# Patient Record
Sex: Male | Born: 1950 | Hispanic: No | Smoking: Never smoker
Health system: Southern US, Community
[De-identification: ages and names within clinical notes are randomized; demographics above are authoritative.]

## PROBLEM LIST (undated history)

## (undated) DIAGNOSIS — E785 Hyperlipidemia, unspecified: Secondary | ICD-10-CM

## (undated) DIAGNOSIS — H33012 Retinal detachment with single break, left eye: Secondary | ICD-10-CM

## (undated) DIAGNOSIS — H269 Unspecified cataract: Secondary | ICD-10-CM

## (undated) DIAGNOSIS — Z8719 Personal history of other diseases of the digestive system: Secondary | ICD-10-CM

## (undated) DIAGNOSIS — M169 Osteoarthritis of hip, unspecified: Secondary | ICD-10-CM

## (undated) DIAGNOSIS — K859 Acute pancreatitis without necrosis or infection, unspecified: Secondary | ICD-10-CM

## (undated) DIAGNOSIS — Z9889 Other specified postprocedural states: Secondary | ICD-10-CM

## (undated) DIAGNOSIS — K5732 Diverticulitis of large intestine without perforation or abscess without bleeding: Secondary | ICD-10-CM

## (undated) DIAGNOSIS — I10 Essential (primary) hypertension: Secondary | ICD-10-CM

## (undated) HISTORY — DX: Acute pancreatitis without necrosis or infection, unspecified: K85.90

## (undated) HISTORY — DX: Osteoarthritis of hip, unspecified: M16.9

## (undated) HISTORY — DX: Essential (primary) hypertension: I10

## (undated) HISTORY — DX: Diverticulitis of large intestine without perforation or abscess without bleeding: K57.32

## (undated) HISTORY — DX: Other specified postprocedural states: Z98.890

## (undated) HISTORY — DX: Personal history of other diseases of the digestive system: Z87.19

## (undated) HISTORY — PX: EYE SURGERY: SHX253

## (undated) HISTORY — DX: Unspecified cataract: H26.9

## (undated) HISTORY — DX: Hyperlipidemia, unspecified: E78.5

## (undated) HISTORY — DX: Retinal detachment with single break, left eye: H33.012

---

## 1999-10-25 ENCOUNTER — Ambulatory Visit (HOSPITAL_COMMUNITY): Admission: RE | Admit: 1999-10-25 | Discharge: 1999-10-25 | Payer: Self-pay | Admitting: Gastroenterology

## 2002-07-29 ENCOUNTER — Encounter: Payer: Self-pay | Admitting: Surgery

## 2002-07-29 ENCOUNTER — Encounter: Admission: RE | Admit: 2002-07-29 | Discharge: 2002-07-29 | Payer: Self-pay | Admitting: Surgery

## 2003-03-18 ENCOUNTER — Encounter: Admission: RE | Admit: 2003-03-18 | Discharge: 2003-03-18 | Payer: Self-pay | Admitting: Surgery

## 2003-03-18 ENCOUNTER — Encounter: Payer: Self-pay | Admitting: Surgery

## 2003-04-20 ENCOUNTER — Encounter: Payer: Self-pay | Admitting: Surgery

## 2003-04-20 ENCOUNTER — Encounter: Admission: RE | Admit: 2003-04-20 | Discharge: 2003-04-20 | Payer: Self-pay | Admitting: Surgery

## 2003-08-26 ENCOUNTER — Encounter: Admission: RE | Admit: 2003-08-26 | Discharge: 2003-08-26 | Payer: Self-pay | Admitting: Obstetrics and Gynecology

## 2004-05-11 ENCOUNTER — Encounter: Admission: RE | Admit: 2004-05-11 | Discharge: 2004-05-11 | Payer: Self-pay | Admitting: Surgery

## 2004-09-04 ENCOUNTER — Encounter: Admission: RE | Admit: 2004-09-04 | Discharge: 2004-09-04 | Payer: Self-pay | Admitting: *Deleted

## 2004-12-13 ENCOUNTER — Ambulatory Visit (HOSPITAL_COMMUNITY): Admission: RE | Admit: 2004-12-13 | Discharge: 2004-12-13 | Payer: Self-pay | Admitting: Gastroenterology

## 2006-05-20 ENCOUNTER — Encounter: Admission: RE | Admit: 2006-05-20 | Discharge: 2006-05-20 | Payer: Self-pay | Admitting: *Deleted

## 2006-09-30 DIAGNOSIS — H269 Unspecified cataract: Secondary | ICD-10-CM

## 2006-09-30 HISTORY — DX: Unspecified cataract: H26.9

## 2007-10-01 DIAGNOSIS — I10 Essential (primary) hypertension: Secondary | ICD-10-CM

## 2007-10-01 HISTORY — DX: Essential (primary) hypertension: I10

## 2008-09-30 DIAGNOSIS — H33012 Retinal detachment with single break, left eye: Secondary | ICD-10-CM

## 2008-09-30 HISTORY — DX: Retinal detachment with single break, left eye: H33.012

## 2009-06-17 ENCOUNTER — Ambulatory Visit (HOSPITAL_COMMUNITY): Admission: RE | Admit: 2009-06-17 | Discharge: 2009-06-17 | Payer: Self-pay | Admitting: Ophthalmology

## 2009-06-29 ENCOUNTER — Encounter: Admission: RE | Admit: 2009-06-29 | Discharge: 2009-06-29 | Payer: Self-pay | Admitting: Surgery

## 2009-08-23 ENCOUNTER — Encounter: Admission: RE | Admit: 2009-08-23 | Discharge: 2009-08-23 | Payer: Self-pay | Admitting: Obstetrics and Gynecology

## 2010-10-21 ENCOUNTER — Encounter: Payer: Self-pay | Admitting: Surgery

## 2011-02-15 NOTE — Op Note (Signed)
NAMELYNCOLN, LEDGERWOOD              ACCOUNT NO.:  [id number]   MEDICAL RECORD NO.:  [id number]          PATIENT TYPE:  AMB   LOCATION:  ENDO                         FACILITY:  [facility]   PHYSICIAN:  James L. Malon Kindle., M.D.DATE OF BIRTH:  29-Apr-1951   DATE OF PROCEDURE:  12/13/2004  DATE OF DISCHARGE:                                 OPERATIVE REPORT   PROCEDURES:  Colonoscopy.   MEDICATIONS:  The patient received a total of 25 mcg of fentanyl and 7 mg of  Versed for both procedures.   SCOPE:  Olympus pediatric adjustable colonoscope.   INDICATIONS:  A 60 year old with a strong family history of colon.  Both  parents have had colon cancer.  This is done as a routine evaluation.   DESCRIPTION OF PROCEDURE:  The procedure had been explained to the patient  and consent obtained.  With the patient in the left lateral decubitus  position, the Olympus scope was inserted and advanced.  The prep was  excellent.  We were able to advance easily to the cecum with no difficulty.  The ileocecal valve and appendiceal orifice were seen.  The scope was  withdrawn in the cecum.  The ascending colon, transverse colon, splenic  flexure, descending and sigmoid colon were seen well and were free of  polyps.  There were scattered diverticula throughout the entire colon most  prominent in the sigmoid with multiple small-mouth diverticula.  The lumen  was not particularly narrowed.  No polyps were seen throughout the entire  colon.  The rectum was seen well and was free of polyps.  There were small  internal hemorrhoids.  The scope was withdrawn.  The patient tolerated the  procedure well.   ASSESSMENT:  1.  Family history of colon cancer with negative colonoscopy at this time.      B16.0  2.  Diverticulosis.   PLAN:  Recommend yearly Hemoccults and diverticulosis instructions.  Will  recommend a repeat colonoscopy in five years.      JLE/MEDQ  D:  12/13/2004  T:  12/13/2004  Job:  [id number]   cc:    Vikki Ports, MD  [address]  [place]  [place] [address]   [place]. [name], MD  [address], [address]  [place]  [place] [address]

## 2011-02-15 NOTE — Op Note (Signed)
Harold Davis, Harold Davis              ACCOUNT NO.:  [id number]   MEDICAL RECORD NO.:  [id number]          PATIENT TYPE:  AMB   LOCATION:  ENDO                         FACILITY:  [facility]   PHYSICIAN:  James L. Malon Kindle., M.D.DATE OF BIRTH:  [birth year]   DATE OF PROCEDURE:  12/13/2004  DATE OF DISCHARGE:                                 OPERATIVE REPORT   PROCEDURE:  Esophagogastroduodenoscopy.   MEDICATIONS:  Cetacaine spray, fentanyl 25 mcg, Versed 5 mg IV.   INDICATIONS:  The patient has had some intermittent esophageal reflux  symptoms.  This is done to look for Barrett's esophagus.   DESCRIPTION OF PROCEDURE:  The procedure had been explained to the patient  and consent obtained.  In the left lateral decubitus position, the Olympus  scope was inserted.  With swallow with agglutination, easily passed into the  esophagus.  The stomach was identified, pylorus identified and passed.  The  duodenum, including the bulb and second portion, was seen well and was  unremarkable.  There was no ulceration or inflammation.  The pyloric channel  was normal, as was the antrum.  There was no gastritis or ulceration.  The  body of the stomach was seen well and was normal.  The scope was  retroflexed, and the fundus and cardia of the stomach were grossly normal.  There was a very small hiatal hernia.  The GE junction was widely patent.  The distal esophagus was reddened.  There were several small erosions just  above the GE junction.  There was no sign of Barrett's esophagus.  The scope  was withdrawn and the proximal esophagus was normal.  The patient tolerated  the procedure well and was maintained on low-flow oxygen and pulse oximetry  throughout the procedure.   ASSESSMENT:  Erosive esophagitis, 530.11, with no signs of Barrett's  esophagus.   PLAN:  Will recommend treating with a PPI of choice.  Will proceed with  colonoscopy at this time as planned.      JLE/MEDQ  D:  12/13/2004  T:   12/13/2004  Job:  [id number]   cc:   [place] [name], MD  [address], [address]  [place]  [place] [address]   Vikki Ports, MD  [phone], [address]  [place]  [place] [address]

## 2011-02-15 NOTE — Procedures (Signed)
[place]  Patient:    Harold Davis                      MRN: [id number] Proc. Date: 10/25/99 Adm. Date:  [id number] Attending:  Orland Mustard CC:         [place]. [name], M.D.                           Procedure Report  PROCEDURE PERFORMED:  Colonoscopy.  ENDOSCOPIST:  Llana Aliment. Randa Evens, M.D.  MEDICATIONS USED:  Fentanyl 50 mcg, Versed 6 mg IV.  INSTRUMENT:  Olympus adult video colonoscope.  INDICATIONS:  Strong family history of colon cancer.  Both parents have had colon cancer.  Patient has had intermittent rectal bleeding that has been presumed to be due for internal hemorrhoids.  DESCRIPTION OF PROCEDURE:  The procedure had been explained to the patient and consent obtained.  With the patient in the left lateral decubitus position, the  adult video colonoscope was inserted and advanced under direct visualization. he prep was excellent and we were able to advance to the cecum.  The appendiceal orifice was seen.  It appeared normal.  The terminal ileum was entered for a short distance and was normal.  The scope was withdrawn.  The cecum, ascending colon,  hepatic flexure, transverse colon, splenic flexure, descending and sigmoid colon were seen well.  Scattered diverticulosis throughout the entire colon.  No polyps were seen.  Scope withdrawn and the rectum was carefully examined.  No abnormalities seen in the rectum.  The patient had moderate-sized internal hemorrhoids.  Scope withdrawn and patient tolerated the procedure well.  ASSESSMENT: 1. Family history of colon cancer with both parents having had colon cancer. 2. Scattered diverticulosis. 3. Internal hemorrhoids.  PLAN:  Will recommend repeating in five years due to his family history. DD:  10/25/99 TD:  10/26/99 Job: [id number]

## 2012-02-21 ENCOUNTER — Other Ambulatory Visit (INDEPENDENT_AMBULATORY_CARE_PROVIDER_SITE_OTHER): Payer: Self-pay | Admitting: Surgery

## 2012-02-23 ENCOUNTER — Other Ambulatory Visit (INDEPENDENT_AMBULATORY_CARE_PROVIDER_SITE_OTHER): Payer: Self-pay | Admitting: Surgery

## 2013-10-09 ENCOUNTER — Other Ambulatory Visit (INDEPENDENT_AMBULATORY_CARE_PROVIDER_SITE_OTHER): Payer: Self-pay | Admitting: General Surgery

## 2014-03-14 ENCOUNTER — Other Ambulatory Visit (INDEPENDENT_AMBULATORY_CARE_PROVIDER_SITE_OTHER): Payer: Self-pay

## 2014-03-14 DIAGNOSIS — O169 Unspecified maternal hypertension, unspecified trimester: Secondary | ICD-10-CM

## 2014-03-14 MED ORDER — HYDROCHLOROTHIAZIDE 25 MG PO TABS
ORAL_TABLET | ORAL | Status: DC
Start: 2014-03-14 — End: 2018-01-15

## 2014-08-31 ENCOUNTER — Other Ambulatory Visit (INDEPENDENT_AMBULATORY_CARE_PROVIDER_SITE_OTHER): Payer: Self-pay

## 2014-08-31 MED ORDER — HYDROCHLOROTHIAZIDE 25 MG PO TABS: 25.0000 mg | ORAL_TABLET | Freq: Every day | ORAL | Status: DC

## 2015-02-22 ENCOUNTER — Encounter: Payer: Self-pay | Admitting: Surgery

## 2015-02-22 ENCOUNTER — Ambulatory Visit
Admission: RE | Admit: 2015-02-22 | Discharge: 2015-02-22 | Disposition: A | Discharge status: Home / Self Care | Payer: 59 | Source: Ambulatory Visit | Attending: Surgery | Admitting: Surgery

## 2015-02-22 ENCOUNTER — Other Ambulatory Visit (INDEPENDENT_AMBULATORY_CARE_PROVIDER_SITE_OTHER): Payer: Self-pay

## 2015-02-22 DIAGNOSIS — Z9889 Other specified postprocedural states: Secondary | ICD-10-CM

## 2015-02-22 DIAGNOSIS — R05 Cough: Secondary | ICD-10-CM

## 2015-02-22 DIAGNOSIS — R059 Cough, unspecified: Secondary | ICD-10-CM

## 2015-02-22 HISTORY — DX: Other specified postprocedural states: Z98.890

## 2015-11-02 ENCOUNTER — Other Ambulatory Visit: Payer: Self-pay | Admitting: Surgery

## 2015-11-02 NOTE — Telephone Encounter (Signed)
I follow Harold Davis for his BP meds. He knows that he needs a PCP.  He is doing well with no symptoms or chest pain.  Obtained B/P today. 138/76.  Did not obtain pulse.   Rx for HCTZ 25mg , #90, refill 1, called in to [facility] pharmacy by Aleatha Borer.

## 2016-03-28 DIAGNOSIS — Z0100 Encounter for examination of eyes and vision without abnormal findings: Secondary | ICD-10-CM | POA: Diagnosis not present

## 2016-11-14 DIAGNOSIS — H26492 Other secondary cataract, left eye: Secondary | ICD-10-CM | POA: Diagnosis not present

## 2017-06-23 ENCOUNTER — Ambulatory Visit
Admission: RE | Admit: 2017-06-23 | Discharge: 2017-06-23 | Disposition: A | Discharge status: Home / Self Care | Payer: 59 | Source: Ambulatory Visit | Attending: General Surgery | Admitting: General Surgery

## 2017-06-23 ENCOUNTER — Other Ambulatory Visit: Payer: Self-pay | Admitting: General Surgery

## 2017-06-23 DIAGNOSIS — M545 Low back pain: Secondary | ICD-10-CM

## 2017-06-23 DIAGNOSIS — M25551 Pain in right hip: Secondary | ICD-10-CM

## 2017-06-23 DIAGNOSIS — M25552 Pain in left hip: Principal | ICD-10-CM

## 2017-06-23 DIAGNOSIS — M549 Dorsalgia, unspecified: Secondary | ICD-10-CM | POA: Diagnosis not present

## 2017-06-23 DIAGNOSIS — M160 Bilateral primary osteoarthritis of hip: Secondary | ICD-10-CM | POA: Diagnosis not present

## 2017-06-23 DIAGNOSIS — M5136 Other intervertebral disc degeneration, lumbar region: Secondary | ICD-10-CM | POA: Diagnosis not present

## 2017-09-29 ENCOUNTER — Other Ambulatory Visit (HOSPITAL_COMMUNITY): Payer: Self-pay | Admitting: General Surgery

## 2017-09-29 DIAGNOSIS — K5792 Diverticulitis of intestine, part unspecified, without perforation or abscess without bleeding: Secondary | ICD-10-CM

## 2017-09-30 DIAGNOSIS — K859 Acute pancreatitis without necrosis or infection, unspecified: Secondary | ICD-10-CM

## 2017-09-30 HISTORY — DX: Acute pancreatitis without necrosis or infection, unspecified: K85.90

## 2017-10-01 ENCOUNTER — Other Ambulatory Visit: Payer: Self-pay | Admitting: Gastroenterology

## 2017-10-01 ENCOUNTER — Encounter (HOSPITAL_COMMUNITY): Payer: Self-pay

## 2017-10-01 ENCOUNTER — Ambulatory Visit (HOSPITAL_COMMUNITY)
Admission: RE | Admit: 2017-10-01 | Discharge: 2017-10-01 | Disposition: A | Discharge status: Home / Self Care | Payer: 59 | Source: Ambulatory Visit | Attending: General Surgery | Admitting: General Surgery

## 2017-10-01 DIAGNOSIS — K4020 Bilateral inguinal hernia, without obstruction or gangrene, not specified as recurrent: Secondary | ICD-10-CM | POA: Insufficient documentation

## 2017-10-01 DIAGNOSIS — K5730 Diverticulosis of large intestine without perforation or abscess without bleeding: Secondary | ICD-10-CM | POA: Insufficient documentation

## 2017-10-01 DIAGNOSIS — K5792 Diverticulitis of intestine, part unspecified, without perforation or abscess without bleeding: Secondary | ICD-10-CM | POA: Diagnosis not present

## 2017-10-01 DIAGNOSIS — K859 Acute pancreatitis without necrosis or infection, unspecified: Secondary | ICD-10-CM | POA: Diagnosis not present

## 2017-10-01 DIAGNOSIS — I700 Atherosclerosis of aorta: Secondary | ICD-10-CM | POA: Diagnosis not present

## 2017-10-01 DIAGNOSIS — R1011 Right upper quadrant pain: Secondary | ICD-10-CM

## 2017-10-01 LAB — POCT I-STAT CREATININE: Creatinine, Ser: 1 mg/dL (ref 0.61–1.24)

## 2017-10-01 MED ORDER — IOPAMIDOL (ISOVUE-300) INJECTION 61%
INTRAVENOUS | Status: AC
  Filled 2017-10-01: qty 100

## 2017-10-01 MED ORDER — IOPAMIDOL (ISOVUE-300) INJECTION 61%
100.0000 mL | Freq: Once | INTRAVENOUS | Status: AC | PRN
  Administered 2017-10-01: 100 mL via INTRAVENOUS

## 2017-10-02 ENCOUNTER — Ambulatory Visit
Admission: RE | Admit: 2017-10-02 | Discharge: 2017-10-02 | Disposition: A | Discharge status: Home / Self Care | Payer: 59 | Source: Ambulatory Visit | Attending: Gastroenterology | Admitting: Gastroenterology

## 2017-10-02 ENCOUNTER — Other Ambulatory Visit (HOSPITAL_COMMUNITY): Payer: Self-pay | Admitting: Gastroenterology

## 2017-10-02 ENCOUNTER — Ambulatory Visit (HOSPITAL_COMMUNITY)
Admission: RE | Admit: 2017-10-02 | Discharge: 2017-10-02 | Disposition: A | Discharge status: Home / Self Care | Payer: 59 | Source: Ambulatory Visit | Attending: Gastroenterology | Admitting: Gastroenterology

## 2017-10-02 DIAGNOSIS — R1011 Right upper quadrant pain: Secondary | ICD-10-CM | POA: Diagnosis not present

## 2017-10-02 DIAGNOSIS — K8050 Calculus of bile duct without cholangitis or cholecystitis without obstruction: Secondary | ICD-10-CM | POA: Diagnosis not present

## 2017-10-02 DIAGNOSIS — R9389 Abnormal findings on diagnostic imaging of other specified body structures: Secondary | ICD-10-CM | POA: Diagnosis not present

## 2017-10-02 DIAGNOSIS — K859 Acute pancreatitis without necrosis or infection, unspecified: Secondary | ICD-10-CM | POA: Diagnosis not present

## 2017-10-02 MED ORDER — GADOBENATE DIMEGLUMINE 529 MG/ML IV SOLN
20.0000 mL | Freq: Once | INTRAVENOUS | Status: AC | PRN
  Administered 2017-10-02: 20 mL via INTRAVENOUS

## 2017-11-28 DIAGNOSIS — E785 Hyperlipidemia, unspecified: Secondary | ICD-10-CM

## 2017-11-28 HISTORY — DX: Hyperlipidemia, unspecified: E78.5

## 2018-01-14 ENCOUNTER — Other Ambulatory Visit: Payer: Self-pay | Admitting: Gastroenterology

## 2018-01-15 ENCOUNTER — Ambulatory Visit (INDEPENDENT_AMBULATORY_CARE_PROVIDER_SITE_OTHER): Payer: 59 | Admitting: Internal Medicine

## 2018-01-15 ENCOUNTER — Encounter: Payer: Self-pay | Admitting: Internal Medicine

## 2018-01-15 VITALS — BP 132/76 | HR 62 | Temp 97.0°F | Resp 18 | Ht 72.75 in | Wt 258.6 lb

## 2018-01-15 DIAGNOSIS — R5383 Other fatigue: Secondary | ICD-10-CM

## 2018-01-15 DIAGNOSIS — I10 Essential (primary) hypertension: Secondary | ICD-10-CM | POA: Diagnosis not present

## 2018-01-15 DIAGNOSIS — Z8719 Personal history of other diseases of the digestive system: Secondary | ICD-10-CM

## 2018-01-15 DIAGNOSIS — E782 Mixed hyperlipidemia: Secondary | ICD-10-CM

## 2018-01-15 DIAGNOSIS — Z79899 Other long term (current) drug therapy: Secondary | ICD-10-CM | POA: Diagnosis not present

## 2018-01-15 DIAGNOSIS — Z0000 Encounter for general adult medical examination without abnormal findings: Secondary | ICD-10-CM

## 2018-01-15 DIAGNOSIS — Z0001 Encounter for general adult medical examination with abnormal findings: Secondary | ICD-10-CM

## 2018-01-15 DIAGNOSIS — E559 Vitamin D deficiency, unspecified: Secondary | ICD-10-CM

## 2018-01-15 DIAGNOSIS — G609 Hereditary and idiopathic neuropathy, unspecified: Secondary | ICD-10-CM

## 2018-01-15 DIAGNOSIS — I700 Atherosclerosis of aorta: Secondary | ICD-10-CM

## 2018-01-15 DIAGNOSIS — Z136 Encounter for screening for cardiovascular disorders: Secondary | ICD-10-CM

## 2018-01-15 DIAGNOSIS — K5730 Diverticulosis of large intestine without perforation or abscess without bleeding: Secondary | ICD-10-CM | POA: Insufficient documentation

## 2018-01-15 DIAGNOSIS — R7309 Other abnormal glucose: Secondary | ICD-10-CM

## 2018-01-15 HISTORY — DX: Personal history of other diseases of the digestive system: Z87.19

## 2018-01-15 MED ORDER — VITAMIN E 1000 UNITS PO CAPS
1000.0000 [IU] | ORAL_CAPSULE | Freq: Every day | ORAL | Status: AC
Start: 2018-01-15 — End: ?

## 2018-01-15 MED ORDER — HM OMEGA-3-6-9 FATTY ACIDS PO CAPS: ORAL_CAPSULE | ORAL | Status: DC

## 2018-01-15 MED ORDER — VITAMIN C 1000 MG PO TABS: ORAL_TABLET | ORAL | Status: DC

## 2018-01-15 MED ORDER — LECITHIN 1200 MG PO CAPS: ORAL_CAPSULE | ORAL | 0 refills | Status: DC

## 2018-01-15 MED ORDER — TURMERIC CURCUMIN 5-1000 MG PO CAPS: 1000.0000 mg | ORAL_CAPSULE | Freq: Every day | ORAL | Status: DC

## 2018-01-15 MED ORDER — ZINC GLUCONATE 50 MG PO TABS
ORAL_TABLET | ORAL | Status: AC
Start: 2018-01-15 — End: ?

## 2018-01-15 NOTE — Patient Instructions (Signed)
We Do NOT Approve of  [facility] [place] Soliciting Our Patients  To Do Home Visits  & We Do NOT Approve of [employer] > > > > > > > > > > > > > > > > > > > > > > > > > > > > > > > > > > >  > > > >   Preventive Care for Adults  A healthy lifestyle and preventive care can promote health and wellness. Preventive health guidelines for men include the following key practices:  A routine yearly physical is a good way to check with your health care provider about your health and preventative screening. It is a chance to share any concerns and updates on your health and to receive a thorough exam.  Visit your dentist for a routine exam and preventative care every 6 months. Brush your teeth twice a day and floss once a day. Good oral hygiene prevents tooth decay and gum disease.  The frequency of eye exams is based on your age, health, family medical history, use of contact lenses, and other factors. Follow your health care provider's recommendations for frequency of eye exams.  Eat a healthy diet. Foods such as vegetables, fruits, whole grains, low-fat dairy products, and lean protein foods contain the nutrients you need without too many calories. Decrease your intake of foods high in solid fats, added sugars, and salt. Eat the right amount of calories for you. Get information about a proper diet from your health care provider, if necessary.  Regular physical exercise is one of the most important things you can do for your health. Most adults should get at least 150 minutes of moderate-intensity exercise (any activity that increases your heart rate and causes you to sweat) each week. In addition, most adults need muscle-strengthening exercises on 2 or more days a week.  Maintain a healthy weight. The body mass index (BMI) is a screening tool to identify possible weight problems. It provides an estimate of body fat based on height and weight. Your health care provider can find  your BMI and can help you achieve or maintain a healthy weight. For adults 20 years and older:  A BMI below 18.5 is considered underweight.  A BMI of 18.5 to 24.9 is normal.  A BMI of 25 to 29.9 is considered overweight.  A BMI of 30 and above is considered obese.  Maintain normal blood lipids and cholesterol levels by exercising and minimizing your intake of saturated fat. Eat a balanced diet with plenty of fruit and vegetables. Blood tests for lipids and cholesterol should begin at age 20 and be repeated every 5 years. If your lipid or cholesterol levels are high, you are over 50, or you are at high risk for heart disease, you may need your cholesterol levels checked more frequently. Ongoing high lipid and cholesterol levels should be treated with medicines if diet and exercise are not working.  If you smoke, find out from your health care provider how to quit. If you do not use tobacco, do not start.  Lung cancer screening is recommended for adults aged 55-80 years who are at high risk for developing lung cancer because of a history of smoking. A yearly low-dose CT scan of the lungs is recommended for people who have at least a 30-pack-year history of smoking and are a current smoker or have quit within the past 15 years. A pack year of smoking is smoking an average of 1 pack   of cigarettes a day for 1 year (for example: 1 pack a day for 30 years or 2 packs a day for 15 years). Yearly screening should continue until the smoker has stopped smoking for at least 15 years. Yearly screening should be stopped for people who develop a health problem that would prevent them from having lung cancer treatment.  If you choose to drink alcohol, do not have more than 2 drinks per day. One drink is considered to be 12 ounces (355 mL) of beer, 5 ounces (148 mL) of wine, or 1.5 ounces (44 mL) of liquor.  Avoid use of street drugs. Do not share needles with anyone. Ask for help if you need support or instructions  about stopping the use of drugs.  High blood pressure causes heart disease and increases the risk of stroke. Your blood pressure should be checked at least every 1-2 years. Ongoing high blood pressure should be treated with medicines, if weight loss and exercise are not effective.  If you are 45-79 years old, ask your health care provider if you should take aspirin to prevent heart disease.  Diabetes screening involves taking a blood sample to check your fasting blood sugar level. Testing should be considered at a younger age or be carried out more frequently if you are overweight and have at least 1 risk factor for diabetes.  Colorectal cancer can be detected and often prevented. Most routine colorectal cancer screening begins at the age of 50 and continues through age 75. However, your health care provider may recommend screening at an earlier age if you have risk factors for colon cancer. On a yearly basis, your health care provider may provide home test kits to check for hidden blood in the stool. Use of a small camera at the end of a tube to directly examine the colon (sigmoidoscopy or colonoscopy) can detect the earliest forms of colorectal cancer. Talk to your health care provider about this at age 50, when routine screening begins. Direct exam of the colon should be repeated every 5-10 years through age 75, unless early forms of precancerous polyps or small growths are found.  Hepatitis C blood testing is recommended for all people born from 1945 through 1965 and any individual with known risks for hepatitis C.  Screening for abdominal aortic aneurysm (AAA)  by ultrasound is recommended for people who have history of high blood pressure or who are current or former smokers.  Healthy men should  receive prostate-specific antigen (PSA) blood tests as part of routine cancer screening. Talk with your health care provider about prostate cancer screening.  Testicular cancer screening is   recommended for adult males. Screening includes self-exam, a health care provider exam, and other screening tests. Consult with your health care provider about any symptoms you have or any concerns you have about testicular cancer.  Use sunscreen. Apply sunscreen liberally and repeatedly throughout the day. You should seek shade when your shadow is shorter than you. Protect yourself by wearing long sleeves, pants, a wide-brimmed hat, and sunglasses year round, whenever you are outdoors.  Once a month, do a whole-body skin exam, using a mirror to look at the skin on your back. Tell your health care provider about new moles, moles that have irregular borders, moles that are larger than a pencil eraser, or moles that have changed in shape or color.  Stay current with required vaccines (immunizations).  Influenza vaccine. All adults should be immunized every year.  Tetanus, diphtheria, and acellular pertussis (  Td, Tdap) vaccine. An adult who has not previously received Tdap or who does not know his vaccine status should receive 1 dose of Tdap. This initial dose should be followed by tetanus and diphtheria toxoids (Td) booster doses every 10 years. Adults with an unknown or incomplete history of completing a 3-dose immunization series with Td-containing vaccines should begin or complete a primary immunization series including a Tdap dose. Adults should receive a Td booster every 10 years.  Zoster vaccine. One dose is recommended for adults aged 60 years or older unless certain conditions are present.    PREVNAR - Pneumococcal 13-valent conjugate (PCV13) vaccine. When indicated, a person who is uncertain of his immunization history and has no record of immunization should receive the PCV13 vaccine. An adult aged 19 years or older who has certain medical conditions and has not been previously immunized should receive 1 dose of PCV13 vaccine. This PCV13 should be followed with a dose of pneumococcal  polysaccharide (PPSV23) vaccine. The PPSV23 vaccine dose should be obtained 1 or more year(s)after the dose of PCV13 vaccine. An adult aged 19 years or older who has certain medical conditions and previously received 1 or more doses of PPSV23 vaccine should receive 1 dose of PCV13. The PCV13 vaccine dose should be obtained 1 or more years after the last PPSV23 vaccine dose.    PNEUMOVAX - Pneumococcal polysaccharide (PPSV23) vaccine. When PCV13 is also indicated, PCV13 should be obtained first. All adults aged 65 years and older should be immunized. An adult younger than age 65 years who has certain medical conditions should be immunized. Any person who resides in a nursing home or long-term care facility should be immunized. An adult smoker should be immunized. People with an immunocompromised condition and certain other conditions should receive both PCV13 and PPSV23 vaccines. People with human immunodeficiency virus (HIV) infection should be immunized as soon as possible after diagnosis. Immunization during chemotherapy or radiation therapy should be avoided. Routine use of PPSV23 vaccine is not recommended for American Indians, [place] Natives, or people younger than 65 years unless there are medical conditions that require PPSV23 vaccine. When indicated, people who have unknown immunization and have no record of immunization should receive PPSV23 vaccine. One-time revaccination 5 years after the first dose of PPSV23 is recommended for people aged 19-64 years who have chronic kidney failure, nephrotic syndrome, asplenia, or immunocompromised conditions. People who received 1-2 doses of PPSV23 before age 65 years should receive another dose of PPSV23 vaccine at age 65 years or later if at least 5 years have passed since the previous dose. Doses of PPSV23 are not needed for people immunized with PPSV23 at or after age 65 years.    Hepatitis A vaccine. Adults who wish to be protected from this disease, have  certain high-risk conditions, work with hepatitis A-infected animals, work in hepatitis A research labs, or travel to or work in countries with a high rate of hepatitis A should be immunized. Adults who were previously unvaccinated and who anticipate close contact with an international adoptee during the first 60 days after arrival in the [place] from a country with a high rate of hepatitis A should be immunized.    Hepatitis B vaccine. Adults should be immunized if they wish to be protected from this disease, have certain high-risk conditions, may be exposed to blood or other infectious body fluids, are household contacts or sex partners of hepatitis B positive people, are clients or workers in certain care facilities, or   travel to or work in countries with a high rate of hepatitis B.   Preventive Service / Frequency   Ages 65 and over  Blood pressure check.  Lipid and cholesterol check.  Lung cancer screening. / Every year if you are aged 55-80 years and have a 30-pack-year history of smoking and currently smoke or have quit within the past 15 years. Yearly screening is stopped once you have quit smoking for at least 15 years or develop a health problem that would prevent you from having lung cancer treatment.  Fecal occult blood test (FOBT) of stool. You may not have to do this test if you get a colonoscopy every 10 years.  Flexible sigmoidoscopy** or colonoscopy.** / Every 5 years for a flexible sigmoidoscopy or every 10 years for a colonoscopy beginning at age 50 and continuing until age 75.  Hepatitis C blood test.** / For all people born from 1945 through 1965 and any individual with known risks for hepatitis C.  Abdominal aortic aneurysm (AAA) screening./ Screening current or former smokers or have Hypertension.  Skin self-exam. / Monthly.  Influenza vaccine. / Every year.  Tetanus, diphtheria, and acellular pertussis (Tdap/Td) vaccine.** / 1 dose of Td every 10  years.   Zoster vaccine.** / 1 dose for adults aged 60 years or older.         Pneumococcal 13-valent conjugate (PCV13) vaccine.    Pneumococcal polysaccharide (PPSV23) vaccine.     Hepatitis A vaccine.** / Consult your health care provider.  Hepatitis B vaccine.** / Consult your health care provider. Screening for abdominal aortic aneurysm (AAA)  by ultrasound is recommended for people who have history of high blood pressure or who are current or former smokers. ++++++++++ Recommend Adult Low Dose Aspirin or  coated  Aspirin 81 mg daily  To reduce risk of Colon Cancer 20 %,  Skin Cancer 26 % ,  Malignant Melanoma 46%  and  Pancreatic cancer 60% ++++++++++++++++++++++ Vitamin D goal  is between 70-100.  Please make sure that you are taking your Vitamin D as directed.  It is very important as a natural anti-inflammatory  helping hair, skin, and nails, as well as reducing stroke and heart attack risk.  It helps your bones and helps with mood. It also decreases numerous cancer risks so please take it as directed.  Low Vit D is associated with a 200-300% higher risk for CANCER  and 200-300% higher risk for HEART   ATTACK  &  STROKE.   ...................................... It is also associated with higher death rate at younger ages,  autoimmune diseases like Rheumatoid arthritis, Lupus, Multiple Sclerosis.    Also many other serious conditions, like depression, Alzheimer's Dementia, infertility, muscle aches, fatigue, fibromyalgia - just to name a few. ++++++++++++++++++++++ Recommend the book "The END of DIETING" by Dr Joel Fuhrman  & the book "The END of DIABETES " by Dr Joel Fuhrman At [employer] - get book & Audio CD's    Being diabetic has a  300% increased risk for heart attack, stroke, cancer, and alzheimer- type vascular dementia. It is very important that you work harder with diet by avoiding all foods that are white. Avoid white rice (brown & wild rice is OK), white  potatoes (sweetpotatoes in moderation is OK), White bread or wheat bread or anything made out of white flour like bagels, donuts, rolls, buns, biscuits, cakes, pastries, cookies, pizza crust, and pasta (made from white flour & egg whites) - vegetarian pasta or spinach or wheat   pasta is OK. Multigrain breads like Arnold's or [employer], or multigrain sandwich thins or flatbreads.  Diet, exercise and weight loss can reverse and cure diabetes in the early stages.  Diet, exercise and weight loss is very important in the control and prevention of complications of diabetes which affects every system in your body, ie. Brain - dementia/stroke, eyes - glaucoma/blindness, heart - heart attack/heart failure, kidneys - dialysis, stomach - gastric paralysis, intestines - malabsorption, nerves - severe painful neuritis, circulation - gangrene & loss of a leg(s), and finally cancer and Alzheimers.    I recommend avoid fried & greasy foods,  sweets/candy, white rice (brown or wild rice or Quinoa is OK), white potatoes (sweet potatoes are OK) - anything made from white flour - bagels, doughnuts, rolls, buns, biscuits,white and wheat breads, pizza crust and traditional pasta made of white flour & egg white(vegetarian pasta or spinach or wheat pasta is OK).  Multi-grain bread is OK - like multi-grain flat bread or sandwich thins. Avoid alcohol in excess. Exercise is also important.    Eat all the vegetables you want - avoid meat, especially red meat and dairy - especially cheese.  Cheese is the most concentrated form of trans-fats which is the worst thing to clog up our arteries. Veggie cheese is OK which can be found in the fresh produce section at [employer] or [employer] or [employer]  ++++++++++++++++++++++ DASH Eating Plan  DASH stands for "Dietary Approaches to Stop Hypertension."   The DASH eating plan is a healthy eating plan that has been shown to reduce high blood pressure (hypertension). Additional health  benefits may include reducing the risk of type 2 diabetes mellitus, heart disease, and stroke. The DASH eating plan may also help with weight loss. WHAT DO I NEED TO KNOW ABOUT THE DASH EATING PLAN? For the DASH eating plan, you will follow these general guidelines:  Choose foods with a percent daily value for sodium of less than 5% (as listed on the food label).  Use salt-free seasonings or herbs instead of table salt or sea salt.  Check with your health care provider or pharmacist before using salt substitutes.  Eat lower-sodium products, often labeled as "lower sodium" or "no salt added."  Eat fresh foods.  Eat more vegetables, fruits, and low-fat dairy products.  Choose whole grains. Look for the word "whole" as the first word in the ingredient list.  Choose fish   Limit sweets, desserts, sugars, and sugary drinks.  Choose heart-healthy fats.  Eat veggie cheese   Eat more home-cooked food and less restaurant, buffet, and fast food.  Limit fried foods.  Cook foods using methods other than frying.  Limit canned vegetables. If you do use them, rinse them well to decrease the sodium.  When eating at a restaurant, ask that your food be prepared with less salt, or no salt if possible.                      WHAT FOODS CAN I EAT? Read Dr Joel Fuhrman's books on The End of Dieting & The End of Diabetes  Grains Whole grain or whole wheat bread. Brown rice. Whole grain or whole wheat pasta. Quinoa, bulgur, and whole grain cereals. Low-sodium cereals. Corn or whole wheat flour tortillas. Whole grain cornbread. Whole grain crackers. Low-sodium crackers.  Vegetables Fresh or frozen vegetables (raw, steamed, roasted, or grilled). Low-sodium or reduced-sodium tomato and vegetable juices. Low-sodium or reduced-sodium tomato sauce and paste. Low-sodium or   reduced-sodium canned vegetables.   Fruits All fresh, canned (in natural juice), or frozen fruits.  Protein Products  All fish  and seafood.  Dried beans, peas, or lentils. Unsalted nuts and seeds. Unsalted canned beans.  Dairy Low-fat dairy products, such as skim or 1% milk, 2% or reduced-fat cheeses, low-fat ricotta or cottage cheese, or plain low-fat yogurt. Low-sodium or reduced-sodium cheeses.  Fats and Oils Tub margarines without trans fats. Light or reduced-fat mayonnaise and salad dressings (reduced sodium). Avocado. Safflower, olive, or canola oils. Natural peanut or almond butter.  Other Unsalted popcorn and pretzels. The items listed above may not be a complete list of recommended foods or beverages. Contact your dietitian for more options.  ++++++++++++++++++++  WHAT FOODS ARE NOT RECOMMENDED? Grains/ White flour or wheat flour White bread. White pasta. White rice. Refined cornbread. Bagels and croissants. Crackers that contain trans fat.  Vegetables  Creamed or fried vegetables. Vegetables in a . Regular canned vegetables. Regular canned tomato sauce and paste. Regular tomato and vegetable juices.  Fruits Dried fruits. Canned fruit in light or heavy syrup. Fruit juice.  Meat and Other Protein Products Meat in general - RED meat & White meat.  Fatty cuts of meat. Ribs, chicken wings, all processed meats as bacon, sausage, bologna, salami, fatback, hot dogs and packaged luncheon meats.  Dairy Whole or 2% milk, cream, half-and-half, and cream cheese. Whole-fat or sweetened yogurt. Full-fat cheeses or blue cheese. Non-dairy creamers and whipped toppings. Processed cheese, cheese spreads, or cheese curds.  Condiments Onion and garlic salt, seasoned salt, table salt, and sea salt. Canned and packaged gravies. Worcestershire sauce. Tartar sauce. Barbecue sauce. Teriyaki sauce. Soy sauce, including reduced sodium. Steak sauce. Fish sauce. Oyster sauce. Cocktail sauce. Horseradish. Ketchup and mustard. Meat flavorings and tenderizers. Bouillon cubes. Hot sauce. Tabasco sauce. Marinades. Taco seasonings.  Relishes.  Fats and Oils Butter, stick margarine, lard, shortening and bacon fat. Coconut, palm kernel, or palm oils. Regular salad dressings.  Pickles and olives. Salted popcorn and pretzels.  The items listed above may not be a complete list of foods and beverages to avoid.

## 2018-01-15 NOTE — H&P (View-Only) (Signed)
[facility]   [facility], M.D.     [facility]. [facility], P.A.-C [facility], Harold Davis                [address] [place], [name] 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  Patient has not been seeing any regular medical doctors. He relates prior hx of diverticulitis in descending & transverse colon and in Jan 2019 a mild case of Pancreatitis. He's scheduled for ERCP & Pancreatic EUS Has has #3 Colonoscopies - last in 2017 by Dr [name] & recommended 5 yr f/u.     Patient was started on HCTZ in 2009 for HTN and in January of this year , he experienced a mild case of focal pancreatitis in the head of the pancreas and the HCTZ was switched to Amlodipine. Patient's BP has been controlled at home.  Today's BP is at goal - 132/76. Patient denies any cardiac symptoms as chest pain, palpitations, shortness of breath, dizziness or ankle swelling.     Patient had labs done last month at [facility] with Total Chol 168, TG 90, HDL 42 and sl elevated LDL 108. Chem Profile was WNL with borderline elevated glucose 103 mg%.  Patient has Morbid Obesity (BMI 34+) and is expectant to have glucose intolerance.    CBC & Diff, TSH and PSA were WNL.     Finally, patient is not aware of ever having Vit D Levels checked  &  vitamin D is anticipated to be deficient. He does c/o intermittent numb to burning paresthesias of his feet.   Current Outpatient Medications on File Prior to Visit  Medication Sig  . amLODipine 5 MG tablet  Takes Daily    No Known Allergies   Past Medical History:  Diagnosis Date  . Cataract  2008   os  . Cataract 2017   od  . Degenerative joint disease (DJD) of hip   . Diverticulitis large intestine   . Hyperlipidemia 11/2017   LDL 108  . Hypertension 2009   started HCTZ & switched to Norvasc in Jan 2019  . Pancreatitis 09/2017  . Retinal detachment of left eye with single retinal tear 2010   Vitrectomy - Dr [name]   Health Maintenance  Topic Date Due  . Hepatitis C Screening  December 01, 1950  . HIV Screening  11/30/1967  . TETANUS/TDAP  11/30/1971  . COLONOSCOPY  11/30/2002  . PNA vac Low Risk Adult (1 of 2 - PCV13) 11/29/2017  . INFLUENZA VACCINE  04/30/2018   Has annual TB Gold at the Hospital. Usually gets annual Flu Vax Last Colon - 2017 - Dr [name] - recc 5 yr f/u.   History reviewed. No pertinent surgical history.   Family History  Problem Relation Age of Onset  . Stroke Mother   . Cancer Mother   . Cancer Father   . Heart disease Brother   . Cancer Brother   . Cancer Sister    Social History   Socioeconomic History  . Marital status: Legally Separated    Spouse name: Not on file  . Number  of children: 3  . Years of education: Not on file  . Highest education level: Professional school degree (e.g., MD, DDS, DVM, JD)  Occupational History  . General Surgeon  Tobacco Use  . Smoking status: No  Substance and Sexual Activity  . Alcohol use: Yes    Comment: infrequent  . Drug use: No  . Sexual activity: Not on file    ROS Constitutional: Denies fever, chills, weight loss/gain, headaches, insomnia,  night sweats or change in appetite. Does c/o fatigue. Eyes: Denies redness, blurred vision, diplopia, discharge, itchy or watery eyes.  ENT: Denies discharge, congestion, post nasal drip, epistaxis, sore throat, earache, hearing loss, dental pain, Tinnitus, Vertigo, Sinus pain or snoring.  Cardio: Denies chest pain, palpitations, irregular heartbeat, syncope, dyspnea, diaphoresis, orthopnea, PND, claudication or edema Respiratory: denies cough,  dyspnea, DOE, pleurisy, hoarseness, laryngitis or wheezing.  Gastrointestinal: Denies dysphagia, heartburn, reflux, water brash, pain, cramps, nausea, vomiting, bloating, diarrhea, constipation, hematemesis, melena, hematochezia, jaundice or hemorrhoids Genitourinary: Denies dysuria, frequency, urgency, nocturia x 1 - infreq 2 x, hesitancy, discharge, hematuria or flank pain Musculoskeletal: C/o Bilat Hip pains. Hx/o L Knee pains. Denies Falls/ Injuries. . Skin: Denies puritis, rash, hives, warts, acne, eczema or change in skin lesion Neuro: No weakness, tremor, incoordination, spasms, paresthesia or pain Psychiatric: Denies confusion, memory loss or sensory loss. Denies Depression. Endocrine: Denies change in weight, skin, hair change, nocturia, and paresthesia, diabetic polys, visual blurring or hyper / hypo glycemic episodes.  Heme/Lymph: No excessive bleeding, bruising or enlarged lymph nodes.  Physical Exam  BP 132/76   Pulse 62   Temp (!) 97 F (36.1 C)   Resp 18   Ht 6' 0.75" (1.848 m)   Wt 258 lb 9.6 oz (117.3 kg)   BMI 34.35 kg/m   General Appearance: Well nourished and well groomed and in no apparent distress.  Eyes: PERRLA, EOMs, conjunctiva no swelling or erythema, normal fundi and vessels. Sinuses: No frontal/maxillary tenderness ENT/Mouth: EACs patent / TMs  nl. Nares clear without erythema, swelling, mucoid exudates. Oral hygiene is good. No erythema, swelling, or exudate. Tongue normal, non-obstructing. Tonsils not swollen or erythematous. Hearing normal.  Neck: Supple, thyroid not palpable. No bruits, nodes or JVD. Respiratory: Respiratory effort normal.  BS equal and clear bilateral without rales, rhonci, wheezing or stridor. Cardio: Heart sounds are normal with regular rate and rhythm and no murmurs, rubs or gallops. Peripheral pulses are normal and equal bilaterally without edema/ PP 2-3(+) bilateral.  No aortic or femoral bruits. Chest: symmetric with normal  excursions and percussion.  Abdomen: Soft, with Nl bowel sounds. Nontender, no guarding, rebound, hernias, masses, or organomegaly.  Lymphatics: Non tender without lymphadenopathy.  Genitourinary: No hernias. DRE - deferred Musculoskeletal: Full ROM all peripheral extremities, joint stability, 5/5 strength, and normal gait. Skin: Warm and dry without rashes, lesions, cyanosis, clubbing or  ecchymosis.  Neuro: Cranial nerves intact, reflexes equal bilaterally. Normal muscle tone, no cerebellar symptoms. Sensation intact to touch and Monofilament to the toes,  Vibratory is slightly decreasedbilaterally. Pysch: Alert and oriented X 3 with normal affect, insight and judgment appropriate.   Assessment and Plan  1. Annual Preventative/Screening Exam   2. Essential hypertension  - Magnesium - EKG 12-Lead - shows NSR /1st deg AVB 282 ms- WNL - Urinalysis, Routine w reflex microscopic  3. Hyperlipidemia  4. Abnormal glucose  - Hemoglobin A1c - Insulin, random  5. Aortic atherosclerosis ([place])  6. Screening for ischemic heart disease  7. Vitamin D deficiency  -  VITAMIN D 25 Hydroxyl  8. Idiopathic peripheral neuropathy  - Vitamin B12  9. Fatigue  - Testosterone  10. Medication management  - Magnesium - Hemoglobin A1c - Insulin, random - VITAMIN D 25 Hydroxy - Vitamin B12           Patient was counseled in prudent diet, weight control to achieve/maintain BMI less than 25, BP monitoring, regular exercise and medications as discussed.  Discussed med effects and SE's. Routine screening labs and tests as requested with regular follow-up as recommended. Over 40 minutes of exam, counseling, chart review and high complex critical decision making was performed

## 2018-01-15 NOTE — Progress Notes (Signed)
[facility]   [name], M.D.     [facility], P.A.-C [facility], [name]                [address] [name], [name] 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  Patient has not been seeing any regular medical doctors. He relates prior hx of diverticulitis in descending & transverse colon and in Jan 2019 a mild case of Pancreatitis. He's scheduled for ERCP & Pancreatic EUS Has has #3 Colonoscopies - last in 2017 by Dr [facility] & recommended 5 yr f/u.     Patient was started on HCTZ in 2009 for HTN and in January of this year , he experienced a mild case of focal pancreatitis in the head of the pancreas and the HCTZ was switched to Amlodipine. Patient's BP has been controlled at home.  Today's BP is at goal - 132/76. Patient denies any cardiac symptoms as chest pain, palpitations, shortness of breath, dizziness or ankle swelling.     Patient had labs done last month at [facility] with Total Chol 168, TG 90, HDL 42 and sl elevated LDL 108. Chem Profile was WNL with borderline elevated glucose 103 mg%.  Patient has Morbid Obesity (BMI 34+) and is expectant to have glucose intolerance.    CBC & Diff, TSH and PSA were WNL.     Finally, patient is not aware of ever having Vit D Levels checked  &  vitamin D is anticipated to be deficient. He does c/o intermittent numb to burning paresthesias of his feet.   Current Outpatient Medications on File Prior to Visit  Medication Sig  . amLODipine 5 MG tablet  Takes Daily    No Known Allergies   Past Medical History:  Diagnosis Date  . Cataract  2008   os  . Cataract 2017   od  . Degenerative joint disease (DJD) of hip   . Diverticulitis large intestine   . Hyperlipidemia 11/2017   LDL 108  . Hypertension 2009   started HCTZ & switched to Norvasc in Jan 2019  . Pancreatitis 09/2017  . Retinal detachment of left eye with single retinal tear 2010   Vitrectomy - Dr [facility]   Health Maintenance  Topic Date Due  . Hepatitis C Screening  Jun 14, 1951  . HIV Screening  11/30/1967  . TETANUS/TDAP  11/30/1971  . COLONOSCOPY  11/30/2002  . PNA vac Low Risk Adult (1 of 2 - PCV13) 11/29/2017  . INFLUENZA VACCINE  04/30/2018   Has annual TB Gold at the Hospital. Usually gets annual Flu Vax Last Colon - 2017 - Dr [facility] - recc 5 yr f/u.   History reviewed. No pertinent surgical history.   Family History  Problem Relation Age of Onset  . Stroke Mother   . Cancer Mother   . Cancer Father   . Heart disease Brother   . Cancer Brother   . Cancer Sister    Social History   Socioeconomic History  . Marital status: Legally Separated    Spouse name: Not on file  . Number  of children: 3  . Years of education: Not on file  . Highest education level: Professional school degree (e.g., MD, DDS, DVM, JD)  Occupational History  . General Davis  Tobacco Use  . Smoking status: No  Substance and Sexual Activity  . Alcohol use: Yes    Comment: infrequent  . Drug use: No  . Sexual activity: Not on file    ROS Constitutional: Denies fever, chills, weight loss/gain, headaches, insomnia,  night sweats or change in appetite. Does c/o fatigue. Eyes: Denies redness, blurred vision, diplopia, discharge, itchy or watery eyes.  ENT: Denies discharge, congestion, post nasal drip, epistaxis, sore throat, earache, hearing loss, dental pain, Tinnitus, Vertigo, Sinus pain or snoring.  Cardio: Denies chest pain, palpitations, irregular heartbeat, syncope, dyspnea, diaphoresis, orthopnea, PND, claudication or edema Respiratory: denies cough,  dyspnea, DOE, pleurisy, hoarseness, laryngitis or wheezing.  Gastrointestinal: Denies dysphagia, heartburn, reflux, water brash, pain, cramps, nausea, vomiting, bloating, diarrhea, constipation, hematemesis, melena, hematochezia, jaundice or hemorrhoids Genitourinary: Denies dysuria, frequency, urgency, nocturia x 1 - infreq 2 x, hesitancy, discharge, hematuria or flank pain Musculoskeletal: C/o Bilat Hip pains. Hx/o L Knee pains. Denies Falls/ Injuries. . Skin: Denies puritis, rash, hives, warts, acne, eczema or change in skin lesion Neuro: No weakness, tremor, incoordination, spasms, paresthesia or pain Psychiatric: Denies confusion, memory loss or sensory loss. Denies Depression. Endocrine: Denies change in weight, skin, hair change, nocturia, and paresthesia, diabetic polys, visual blurring or hyper / hypo glycemic episodes.  Heme/Lymph: No excessive bleeding, bruising or enlarged lymph nodes.  Physical Exam  BP 132/76   Pulse 62   Temp (!) 97 F (36.1 C)   Resp 18   Ht 6' 0.75" (1.848 m)   Wt 258 lb 9.6 oz (117.3 kg)   BMI 34.35 kg/m   General Appearance: Well nourished and well groomed and in no apparent distress.  Eyes: PERRLA, EOMs, conjunctiva no swelling or erythema, normal fundi and vessels. Sinuses: No frontal/maxillary tenderness ENT/Mouth: EACs patent / TMs  nl. Nares clear without erythema, swelling, mucoid exudates. Oral hygiene is good. No erythema, swelling, or exudate. Tongue normal, non-obstructing. Tonsils not swollen or erythematous. Hearing normal.  Neck: Supple, thyroid not palpable. No bruits, nodes or JVD. Respiratory: Respiratory effort normal.  BS equal and clear bilateral without rales, rhonci, wheezing or stridor. Cardio: Heart sounds are normal with regular rate and rhythm and no murmurs, rubs or gallops. Peripheral pulses are normal and equal bilaterally without edema/ PP 2-3(+) bilateral.  No aortic or femoral bruits. Chest: symmetric with normal  excursions and percussion.  Abdomen: Soft, with Nl bowel sounds. Nontender, no guarding, rebound, hernias, masses, or organomegaly.  Lymphatics: Non tender without lymphadenopathy.  Genitourinary: No hernias. DRE - deferred Musculoskeletal: Full ROM all peripheral extremities, joint stability, 5/5 strength, and normal gait. Skin: Warm and dry without rashes, lesions, cyanosis, clubbing or  ecchymosis.  Neuro: Cranial nerves intact, reflexes equal bilaterally. Normal muscle tone, no cerebellar symptoms. Sensation intact to touch and Monofilament to the toes,  Vibratory is slightly decreasedbilaterally. Pysch: Alert and oriented X 3 with normal affect, insight and judgment appropriate.   Assessment and Plan  1. Annual Preventative/Screening Exam   2. Essential hypertension  - Magnesium - EKG 12-Lead - shows NSR /1st deg AVB 282 ms- WNL - Urinalysis, Routine w reflex microscopic  3. Hyperlipidemia  4. Abnormal glucose  - Hemoglobin A1c - Insulin, random  5. Aortic atherosclerosis ([place])  6. Screening for ischemic heart disease  7. Vitamin D deficiency  -  VITAMIN D 25 Hydroxyl  8. Idiopathic peripheral neuropathy  - Vitamin B12  9. Fatigue  - Testosterone  10. Medication management  - Magnesium - Hemoglobin A1c - Insulin, random - VITAMIN D 25 Hydroxy - Vitamin B12           Patient was counseled in prudent diet, weight control to achieve/maintain BMI less than 25, BP monitoring, regular exercise and medications as discussed.  Discussed med effects and SE's. Routine screening labs and tests as requested with regular follow-up as recommended. Over 40 minutes of exam, counseling, chart review and high complex critical decision making was performed

## 2018-01-16 LAB — MAGNESIUM: Magnesium: 2.2 mg/dL (ref 1.5–2.5)

## 2018-01-16 LAB — URINALYSIS, ROUTINE W REFLEX MICROSCOPIC
BILIRUBIN URINE: NEGATIVE
Glucose, UA: NEGATIVE
Hgb urine dipstick: NEGATIVE
Ketones, ur: NEGATIVE
Leukocytes, UA: NEGATIVE
Nitrite: NEGATIVE
Protein, ur: NEGATIVE
SPECIFIC GRAVITY, URINE: 1.026 (ref 1.001–1.03)
pH: <=5 (ref 5.0–8.0)

## 2018-01-16 LAB — HEMOGLOBIN A1C
HEMOGLOBIN A1C: 5.7 %{Hb} — AB (ref <5.7)
MEAN PLASMA GLUCOSE: 117 (calc)
eAG (mmol/L): 6.5 (calc)

## 2018-01-16 LAB — VITAMIN B12: Vitamin B-12: 351 pg/mL (ref 200–1100)

## 2018-01-16 LAB — VITAMIN D 25 HYDROXY (VIT D DEFICIENCY, FRACTURES): Vit D, 25-Hydroxy: 25 ng/mL — ABNORMAL LOW (ref 30–100)

## 2018-01-16 LAB — TESTOSTERONE: Testosterone: 255 ng/dL (ref 250–827)

## 2018-01-16 LAB — INSULIN, RANDOM: INSULIN: 22.1 u[IU]/mL — AB (ref 2.0–19.6)

## 2018-01-19 ENCOUNTER — Encounter: Payer: Self-pay | Admitting: *Deleted

## 2018-01-19 ENCOUNTER — Encounter: Payer: Self-pay | Admitting: Internal Medicine

## 2018-01-29 ENCOUNTER — Ambulatory Visit: Payer: Self-pay | Admitting: Internal Medicine

## 2018-02-03 ENCOUNTER — Encounter (HOSPITAL_COMMUNITY): Payer: Self-pay

## 2018-02-03 ENCOUNTER — Other Ambulatory Visit: Payer: Self-pay

## 2018-02-04 ENCOUNTER — Ambulatory Visit (HOSPITAL_COMMUNITY)
Admission: RE | Admit: 2018-02-04 | Discharge: 2018-02-04 | Disposition: A | Discharge status: Home / Self Care | Payer: 59 | Source: Ambulatory Visit | Attending: Gastroenterology | Admitting: Gastroenterology

## 2018-02-04 ENCOUNTER — Ambulatory Visit (HOSPITAL_COMMUNITY): Payer: 59 | Admitting: Anesthesiology

## 2018-02-04 ENCOUNTER — Other Ambulatory Visit: Payer: Self-pay | Admitting: Gastroenterology

## 2018-02-04 ENCOUNTER — Encounter (HOSPITAL_COMMUNITY)
Admission: RE | Disposition: A | Discharge status: Home / Self Care | Payer: Self-pay | Source: Ambulatory Visit | Attending: Gastroenterology

## 2018-02-04 ENCOUNTER — Encounter (HOSPITAL_COMMUNITY): Payer: Self-pay | Admitting: *Deleted

## 2018-02-04 DIAGNOSIS — K869 Disease of pancreas, unspecified: Secondary | ICD-10-CM | POA: Insufficient documentation

## 2018-02-04 DIAGNOSIS — K859 Acute pancreatitis without necrosis or infection, unspecified: Secondary | ICD-10-CM | POA: Diagnosis not present

## 2018-02-04 DIAGNOSIS — Z79899 Other long term (current) drug therapy: Secondary | ICD-10-CM | POA: Insufficient documentation

## 2018-02-04 DIAGNOSIS — R935 Abnormal findings on diagnostic imaging of other abdominal regions, including retroperitoneum: Secondary | ICD-10-CM | POA: Diagnosis not present

## 2018-02-04 DIAGNOSIS — E785 Hyperlipidemia, unspecified: Secondary | ICD-10-CM | POA: Diagnosis not present

## 2018-02-04 DIAGNOSIS — I10 Essential (primary) hypertension: Secondary | ICD-10-CM | POA: Insufficient documentation

## 2018-02-04 DIAGNOSIS — K8500 Idiopathic acute pancreatitis without necrosis or infection: Secondary | ICD-10-CM | POA: Diagnosis not present

## 2018-02-04 HISTORY — PX: EUS: SHX5427

## 2018-02-04 SURGERY — UPPER ENDOSCOPIC ULTRASOUND (EUS) RADIAL
Anesthesia: Monitor Anesthesia Care

## 2018-02-04 MED ORDER — PROPOFOL 500 MG/50ML IV EMUL
INTRAVENOUS | Status: DC | PRN
  Administered 2018-02-04: 120 ug/kg/min via INTRAVENOUS

## 2018-02-04 MED ORDER — SODIUM CHLORIDE 0.9 % IV SOLN: INTRAVENOUS | Status: DC

## 2018-02-04 MED ORDER — LIDOCAINE 2% (20 MG/ML) 5 ML SYRINGE
INTRAMUSCULAR | Status: DC | PRN
  Administered 2018-02-04: 100 mg via INTRAVENOUS

## 2018-02-04 MED ORDER — PROPOFOL 10 MG/ML IV BOLUS
INTRAVENOUS | Status: AC
  Filled 2018-02-04: qty 20

## 2018-02-04 MED ORDER — LACTATED RINGERS IV SOLN
INTRAVENOUS | Status: DC
  Administered 2018-02-04: 10:00:00 via INTRAVENOUS

## 2018-02-04 MED ORDER — PROPOFOL 10 MG/ML IV BOLUS
INTRAVENOUS | Status: AC
Start: 2018-02-04 — End: ?
  Filled 2018-02-04: qty 40

## 2018-02-04 MED ORDER — PROPOFOL 10 MG/ML IV BOLUS
INTRAVENOUS | Status: DC | PRN
  Administered 2018-02-04 (×6): 20 mg via INTRAVENOUS

## 2018-02-04 NOTE — Transfer of Care (Signed)
Immediate Anesthesia Transfer of Care Note  Patient: Harold Davis  Procedure(s) Performed: UPPER ENDOSCOPIC ULTRASOUND (EUS) RADIAL (N/A )  Patient Location: Endoscopy Unit  Anesthesia Type:MAC  Level of Consciousness: awake, alert  and oriented  Airway & Oxygen Therapy: Patient Spontanous Breathing and Patient connected to nasal cannula oxygen  Post-op Assessment: Report given to RN and Post -op Vital signs reviewed and stable  Post vital signs: Reviewed and stable  Last Vitals:  Vitals Value Taken Time  BP    Temp    Pulse    Resp    SpO2      Last Pain:  Vitals:   02/04/18 0929  TempSrc: Oral  PainSc:       Patients Stated Pain Goal: 2 (09/32/67 1245)  Complications: No apparent anesthesia complications

## 2018-02-04 NOTE — Discharge Instructions (Signed)

## 2018-02-04 NOTE — Anesthesia Postprocedure Evaluation (Signed)
Anesthesia Post Note  Patient: Harold Davis  Procedure(s) Performed: UPPER ENDOSCOPIC ULTRASOUND (EUS) RADIAL (N/A )     Patient location during evaluation: Endoscopy Anesthesia Type: MAC Level of consciousness: awake Pain management: pain level controlled Vital Signs Assessment: post-procedure vital signs reviewed and stable Respiratory status: spontaneous breathing Cardiovascular status: stable Anesthetic complications: no    Last Vitals:  Vitals:   02/04/18 1110 02/04/18 1120  BP: 131/61 112/62  Pulse: 69 60  Resp: 11 10  Temp:    SpO2: 97% 97%    Last Pain:  Vitals:   02/04/18 1120  TempSrc:   PainSc: 0-No pain                 Kryssa Risenhoover

## 2018-02-04 NOTE — Anesthesia Preprocedure Evaluation (Addendum)
Anesthesia Evaluation  Patient identified by MRN, date of birth, ID band Patient awake    Reviewed: Allergy & Precautions, NPO status , Patient's Chart, lab work & pertinent test results  Airway Mallampati: II  TM Distance: >3 FB     Dental   Pulmonary neg pulmonary ROS,    breath sounds clear to auscultation       Cardiovascular hypertension,  Rhythm:Regular Rate:Normal     Neuro/Psych negative neurological ROS     GI/Hepatic Neg liver ROS, History noted. CG   Endo/Other  negative endocrine ROS  Renal/GU negative Renal ROS     Musculoskeletal   Abdominal   Peds  Hematology   Anesthesia Other Findings   Reproductive/Obstetrics                            Anesthesia Physical Anesthesia Plan  ASA: III  Anesthesia Plan: MAC   Post-op Pain Management:    Induction: Intravenous  PONV Risk Score and Plan: Treatment may vary due to age or medical condition  Airway Management Planned: Simple Face Mask  Additional Equipment:   Intra-op Plan:   Post-operative Plan:   Informed Consent: I have reviewed the patients History and Physical, chart, labs and discussed the procedure including the risks, benefits and alternatives for the proposed anesthesia with the patient or authorized representative who has indicated his/her understanding and acceptance.   Dental advisory given  Plan Discussed with: CRNA and Anesthesiologist  Anesthesia Plan Comments:         Anesthesia Quick Evaluation

## 2018-02-04 NOTE — Op Note (Signed)
[facility] Patient Name: Harold Davis Procedure Date: 02/04/2018 MRN: [id number] Attending MD: Arta Silence , MD Date of Birth: 05/14/1951 CSN: [id number] Age: 67 Admit Type: Outpatient Procedure:                Upper EUS Indications:              Abnormal abdominal/pelvic CT scan, Acute                            pancreatitis Providers:                Arta Silence, MD, Cleda Daub, RN, Saren Corkern Dalton, Technician Referring MD:             Laurence Spates, MD Medicines:                Monitored Anesthesia Care Complications:            No immediate complications. Estimated Blood Loss:     Estimated blood loss: none. Procedure:                Pre-Anesthesia Assessment:                           - Prior to the procedure, a History and Physical                            was performed, and patient medications and                            allergies were reviewed. The patient's tolerance of                            previous anesthesia was also reviewed. The risks                            and benefits of the procedure and the sedation                            options and risks were discussed with the patient.                            All questions were answered, and informed consent                            was obtained. Prior Anticoagulants: The patient has                            taken no previous anticoagulant or antiplatelet                            agents. ASA Grade Assessment: II - A patient with                            mild systemic disease.  After reviewing the risks                            and benefits, the patient was deemed in                            satisfactory condition to undergo the procedure.                           After obtaining informed consent, the endoscope was                            passed under direct vision. Throughout the                            procedure, the patient's blood pressure,  pulse, and                            oxygen saturations were monitored continuously. The                            [id number] ([id number]) scope was introduced through                            the mouth, and advanced to the second part of                            duodenum. The upper EUS was accomplished without                            difficulty. The patient tolerated the procedure                            well. Scope In: Scope Out: Findings:      ENDOSCOPIC FINDING: :      Limited views of esophagus, stomach, proximal duodenum noted. Slight       edema of duodenal bulb of doubtful significance. Mild erosions of antrum       of doubtful significance.      ENDOSONOGRAPHIC FINDING: :      Endosonographic images of the stomach were unremarkable.      There was no sign of significant endosonographic abnormality in the       ampulla.      There was no sign of significant endosonographic abnormality in the       gallbladder. An unremarkable gallbladder, no pathologic lymphadenopathy,       no stones, no biliary sludge and ducts with regular contour were       identified.      There was no sign of significant endosonographic abnormality in the       common bile duct. The maximum diameter of the duct was 5 mm.      Pancreatic parenchymal abnormalities were noted in the entire pancreas.       These consisted of hyperechoic foci and hypoechoic foci. These are       non-specific changes, likely age-related (or possibly from prior       pancreatitis);  no chronic pancreatitis was identified. No pancreatic       ductal dilatation was identified.      Endosonographic imaging in the in the pancreas showed no mass.      No lymphadenopathy seen.      There was no sign of significant endosonographic abnormality in the left       lobe of the liver. Impression:               - Endosonographic images of the stomach were                            unremarkable.                           - There  was no sign of significant pathology in the                            ampulla.                           - There was no sign of significant pathology in the                            gallbladder.                           - There was no sign of significant pathology in the                            common bile duct.                           - Pancreatic parenchymal abnormalities consisting                            of hyperechoic foci and hypoechoic foci were noted                            in the entire pancreas.                           - There was no evidence of significant pathology in                            the left lobe of the liver.                           - No explanation for patient's pancreatitis noted.                            Could have been medication (HCTZ). No obvious                            sludge/stones seen in GB or CBD after extensive  evaluation. Moderate Sedation:      None Recommendation:           - Discharge patient to home (via wheelchair).                           - Resume previous diet today.                           - Continue present medications.                           - Return to GI clinic PRN.                           - Return to referring physician as previously                            scheduled.                           - In absence of any further attacks of                            pancreatitis, would monitor expectantly; if,                            however, patient has another attack of                            pancreatitis, could consider cholecystectomy as                            next step in management. Procedure Code(s):        --- Professional ---                           [phone], Esophagogastroduodenoscopy, flexible,                            transoral; with endoscopic ultrasound examination,                            including the esophagus, stomach, and either the                             duodenum or a surgically altered stomach where the                            jejunum is examined distal to the anastomosis Diagnosis Code(s):        --- Professional ---                           K86.9, Disease of pancreas, unspecified                           K85.90, Acute pancreatitis without necrosis or  infection, unspecified                           R93.5, Abnormal findings on diagnostic imaging of                            other abdominal regions, including retroperitoneum CPT copyright 2017 American Medical Association. All rights reserved. The codes documented in this report are preliminary and upon coder review may  be revised to meet current compliance requirements. Arta Silence, MD 02/04/2018 11:06:47 AM This report has been signed electronically. Number of Addenda: 0

## 2018-02-04 NOTE — Interval H&P Note (Signed)
History and Physical Interval Note:  02/04/2018 9:52 AM  Harold Davis  has presented today for surgery, with the diagnosis of Idiopathic pancreatitis  The various methods of treatment have been discussed with the patient and family. After consideration of risks, benefits and other options for treatment, the patient has consented to  Procedure(s) with comments: UPPER ENDOSCOPIC ULTRASOUND (EUS) RADIAL (N/A) ENDOSCOPIC RETROGRADE CHOLANGIOPANCREATOGRAPHY (ERCP) (N/A) - possible as a surgical intervention .  The patient's history has been reviewed, patient examined, no change in status, stable for surgery.  I have reviewed the patient's chart and labs.  Questions were answered to the patient's satisfaction.     Harold Davis M  Assessment:  1.  Idiopathic pancreatitis in January 2019.  Symptoms resolved.  Most recent labs normal.  No alarm signs.  Plan:  1.  Upper endoscopic ultrasound.   2.  Risks (bleeding, infection, bowel perforation that could require surgery, sedation-related changes in cardiopulmonary systems), benefits (identification and possible treatment of source of symptoms, exclusion of certain causes of symptoms), and alternatives (watchful waiting, radiographic imaging studies, empiric medical treatment) of upper endoscopy with ultrasound and possible fine needle aspiration (EUS +/- FNA) were explained to patient/family in detail and patient wishes to proceed. 3.  If choledocholithiasis is seen, would pursue same-day endoscopic retrograde cholangiopancreatography. 4.  Risks (up to and including bleeding, infection, perforation, pancreatitis that can be complicated by infected necrosis and death), benefits (removal of stones, alleviating blockage, decreasing risk of cholangitis or choledocholithiasis-related pancreatitis), and alternatives (watchful waiting, percutaneous transhepatic cholangiography) of ERCP were explained to patient/family in detail and patient elects to proceed.

## 2018-02-05 ENCOUNTER — Encounter (HOSPITAL_COMMUNITY): Payer: Self-pay | Admitting: Gastroenterology

## 2018-02-05 NOTE — Anesthesia Postprocedure Evaluation (Signed)
Anesthesia Post Note  Patient: Harold Davis  Procedure(s) Performed: UPPER ENDOSCOPIC ULTRASOUND (EUS) RADIAL (N/A )     Anesthesia Post Evaluation  Last Vitals:  Vitals:   02/04/18 1110 02/04/18 1120  BP: 131/61 112/62  Pulse: 69 60  Resp: 11 10  Temp:    SpO2: 97% 97%    Last Pain:  Vitals:   02/04/18 1120  TempSrc:   PainSc: 0-No pain                 Harold Davis

## 2018-11-30 ENCOUNTER — Other Ambulatory Visit: Payer: Self-pay | Admitting: General Surgery

## 2018-11-30 ENCOUNTER — Ambulatory Visit
Admission: RE | Admit: 2018-11-30 | Discharge: 2018-11-30 | Disposition: A | Discharge status: Home / Self Care | Payer: 59 | Source: Ambulatory Visit | Attending: General Surgery | Admitting: General Surgery

## 2018-11-30 DIAGNOSIS — M25551 Pain in right hip: Secondary | ICD-10-CM

## 2018-11-30 DIAGNOSIS — M25552 Pain in left hip: Principal | ICD-10-CM

## 2018-11-30 DIAGNOSIS — M160 Bilateral primary osteoarthritis of hip: Secondary | ICD-10-CM | POA: Diagnosis not present

## 2019-01-22 ENCOUNTER — Encounter: Payer: Self-pay | Admitting: Adult Health

## 2019-01-22 ENCOUNTER — Encounter: Payer: Self-pay | Admitting: *Deleted

## 2019-01-22 DIAGNOSIS — E669 Obesity, unspecified: Secondary | ICD-10-CM | POA: Insufficient documentation

## 2019-01-22 DIAGNOSIS — E559 Vitamin D deficiency, unspecified: Secondary | ICD-10-CM | POA: Insufficient documentation

## 2019-01-22 DIAGNOSIS — E782 Mixed hyperlipidemia: Secondary | ICD-10-CM | POA: Insufficient documentation

## 2019-01-22 DIAGNOSIS — R7309 Other abnormal glucose: Secondary | ICD-10-CM | POA: Insufficient documentation

## 2019-01-22 DIAGNOSIS — E785 Hyperlipidemia, unspecified: Secondary | ICD-10-CM

## 2019-01-22 NOTE — Progress Notes (Addendum)
Virtual Visit via Telephone Note  I connected with Harold Davis on 01/25/19 at  1:30 PM EDT by  televideo  application and verified that I am speaking with the correct person using two identifiers.   I discussed the limitations of evaluation and management by televideo and the availability of in person appointments. The patient expressed understanding and agreed to proceed.   I discussed the assessment and treatment plan with the patient. The patient was provided an opportunity to ask questions and all were answered. The patient agreed with the plan and demonstrated an understanding of the instructions.   The patient was advised to call back or seek an in-person evaluation if the symptoms worsen or if the condition fails to improve as anticipated.  I provided 28 minutes of non-face-to-face time during this encounter.   Izora Ribas, NP    MEDICARE [facility] VISIT AND FOLLOW UP Assessment:   Harold Davis was seen today for medicare wellness.  Diagnoses and all orders for this visit:  Encounter for Medicare annual wellness exam Requested reports for colonoscopies; getting q5y via Dr. [name] He declines tetanus booster Getting flu vaccine annually via work Defer prevnar 13 to next visit He will contact back regarding labs - checking with hospital about annual free lab fair  Essential hypertension Continue medication Monitor blood pressure at home; call if consistently over 130/80 Continue DASH diet.   Reminder to go to the ER if any CP, SOB, nausea, dizziness, severe HA, changes vision/speech, left arm numbness and tingling and jaw pain.  Sigmoid diverticulosis Continue lifestyle modification; consider adding daily fiber supplement  Other abnormal glucose (prediabetes) Discussed disease and risks Discussed diet/exercise, weight management  Monitor A1C  Obesity (BMI 30.0-34.9) Long discussion about weight loss, diet, and exercise Recommended diet heavy in fruits  and veggies and low in animal meats, cheeses, and dairy products, appropriate calorie intake Discussed appropriate weight for height  Follow up at next visit  Hyperlipidemia, unspecified hyperlipidemia type Mild elevations not requiring statin therapy; lifestyle only at this time Continue low cholesterol diet and exercise.  Check lipid panel.   Vitamin D deficiency Continue supplementation for goal of 60-100 Check vitamin D level  Arthritis of right hip Pending R THA by Dr. Ninfa Linden; deferred due to covid 19   Over 30 minutes of exam, counseling, chart review, and critical decision making was performed  No future appointments.   Plan:   During the course of the visit the patient was educated and counseled about appropriate screening and preventive services including:    Pneumococcal vaccine   Influenza vaccine  Prevnar 13  Td vaccine  Screening electrocardiogram  Colorectal cancer screening  Diabetes screening  Glaucoma screening  Nutrition counseling    Subjective:  Harold Davis is a 68 y.o. male who presents for Medicare Annual Wellness Visit and 3 month follow up for HTN, hyperlipidemia, prediabetes, and vitamin D Def.   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   He had elective R hip surgery scheduled but has been postponed due to covid 19.   BMI is Body mass index is 33.87 kg/m., he has been working on diet and exercise as he can manage with hip pain. 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  Wt Readings from Last 3 Encounters:  01/25/19 255 lb (115.7 kg)  02/04/18 254 lb (115.2 kg)  01/15/18 258 lb 9.6  oz (117.3 kg)   His blood pressure has been controlled at home by amlodipine 5 mg daily, today their BP is BP: 130/82 He does workout. He denies chest pain, shortness of breath, dizziness.   He is not on cholesterol medication and denies  myalgias. His cholesterol is not at goal.  The cholesterol last visit was:  No results found for: CHOL, HDL, LDLCALC, LDLDIRECT, TRIG, CHOLHDL  He submitted labs from work physical from 12/22/2017 - he hasn't had this done yet this year due to covid 19. He will verify and follow up -   Total cholesterol 168  Trigs 90  HDL 42  LDL 108   He has been working on diet and exercise for prediabetes, and denies increased appetite, nausea, paresthesia of the feet, polydipsia, polyuria and visual disturbances. Last A1C in the office was:  Lab Results  Component Value Date   HGBA1C 5.7 (H) 01/15/2018   Last GFR No results found for: GFRNONAA  No results found for: GFRAA   Patient is on Vitamin D supplement, taking 10000 IU since the last visit Lab Results  Component Value Date   VD25OH 25 (L) 01/15/2018      Medication Review:    Current Outpatient Medications (Cardiovascular):  .  amLODipine (NORVASC) 5 MG tablet, Take 5 mg by mouth daily.      Current Outpatient Medications (Other):  Marland Kitchen  Ascorbic Acid (VITAMIN C) 1000 MG tablet, Takes Daily (Patient taking differently: Take 1,000 mg by mouth daily. ) .  b complex vitamins tablet, Take 1 tablet by mouth daily. .  Cholecalciferol (VITAMIN D3) 10000 units TABS, Take 10,000 Units by mouth daily. Marland Kitchen  HM OMEGA-3-6-9 FATTY ACIDS CAPS, Takes Daily (Patient taking differently: Take 1 capsule by mouth daily. ) .  Lecithin 1000 MG CHEW, Chew 1,000 mg by mouth daily. .  Misc Natural Products (RESVERATROL DIET PO), Take 1 capsule by mouth daily. .  vitamin E 1000 UNIT capsule, Take 1 capsule (1,000 Units total) by mouth daily. Marland Kitchen  zinc gluconate 50 MG tablet, Takes 1 tablet every other day (Patient taking differently: Take 50 mg by mouth daily. ) .  TURMERIC PO, Take 1 capsule by mouth daily.  Allergies: No Known Allergies  Current Problems (verified) has Essential hypertension; Sigmoid diverticulosis; History of diverticulitis; Other abnormal  glucose (prediabetes); Obesity (BMI 30.0-34.9); Hyperlipidemia; and Vitamin D deficiency on their problem list.  Screening Tests  There is no immunization history on file for this patient.  Preventative care: Last colonoscopy: getting every 5 years by Dr. [name], last 2016?   Prior vaccinations: TD or Tdap: 2005 declines   Influenza: 2019 via hospital   Pneumococcal:  Prevnar13: DUE - defer to next, may schedule NV and get with labs- pt will call back  Shingles/Zostavax: declines at this time   Names of Other Physician/Practitioners you currently use: 1. [facility] here for primary care 2. Dr. Kathrin Penner, eye doctor, last visit ? 2018  3. Dr. Redmond Pulling, dentist, last visit 2019  Patient Care Team: [name], MD as PCP - General (Internal Medicine) Patient, No Pcp Per (General Practice)  Surgical: He  has a past surgical history that includes EUS (N/A, 02/04/2018). Family His family history includes Cancer in his brother, father, mother, and sister; Heart disease in his brother; Stroke in his mother. Social history  He reports that he has never smoked. He has never used smokeless tobacco. He reports current alcohol use. He reports that he  does not use drugs.  MEDICARE WELLNESS OBJECTIVES: Physical activity: Current Exercise Habits: Home exercise routine, Type of exercise: Other - see comments;strength training/weights(stationary bicycle), Time (Minutes): 20, Frequency (Times/Week): 3, Weekly Exercise (Minutes/Week): 60, Intensity: Mild, Exercise limited by: orthopedic condition(s) Cardiac risk factors: Cardiac Risk Factors include: advanced age (>67men, >19 women);dyslipidemia;hypertension;male gender;obesity (BMI >30kg/m2) Depression/mood screen:   Depression screen [facility] 2/9 01/25/2019  Decreased Interest 0  Down, Depressed, Hopeless 0  PHQ - 2 Score 0    ADLs:  In your present state of health, do you have any difficulty performing  the following activities: 01/25/2019  Hearing? N  Vision? N  Difficulty concentrating or making decisions? N  Walking or climbing stairs? Y  Comment hip pain with stairs  Dressing or bathing? N  Doing errands, shopping? N  Some recent data might be hidden     Cognitive Testing  Alert? Yes  Normal Appearance?Yes  Oriented to person? Yes  Place? Yes   Time? Yes  Recall of three objects?  Yes  Can perform simple calculations? Yes  Displays appropriate judgment?Yes  Can read the correct time from a watch face?Yes  EOL planning: Does Patient Have a Medical Advance Directive?: Yes Type of Advance Directive: Healthcare Power of Attorney, Living will Does patient want to make changes to medical advance directive?: No - Patient declined Copy of [place] in Chart?: No - copy requested   Objective:   Today's Vitals   01/25/19 1317  BP: 130/82  Pulse: 68  Weight: 255 lb (115.7 kg)   Body mass index is 33.87 kg/m.  General : Well sounding patient in no apparent distress HEENT: no hoarseness, no cough for duration of visit Lungs: speaks in complete sentences, no audible wheezing, no apparent distress Neurological: alert, oriented x 3 Psychiatric: pleasant, judgement appropriate    Medicare Attestation I have personally reviewed: The patient's medical and social history Their use of alcohol, tobacco or illicit drugs Their current medications and supplements The patient's functional ability including ADLs,fall risks, home safety risks, cognitive, and hearing and visual impairment Diet and physical activities Evidence for depression or mood disorders  The patient's weight, height, BMI, and visual acuity have been recorded in the chart.  I have made referrals, counseling, and provided education to the patient based on review of the above and I have provided the patient with a written personalized care plan for preventive services.     Izora Ribas,  NP   01/25/2019

## 2019-01-25 ENCOUNTER — Other Ambulatory Visit: Payer: Self-pay

## 2019-01-25 ENCOUNTER — Encounter: Payer: Self-pay | Admitting: Adult Health

## 2019-01-25 ENCOUNTER — Ambulatory Visit: Payer: PPO | Admitting: Adult Health

## 2019-01-25 VITALS — BP 130/82 | HR 68 | Wt 255.0 lb

## 2019-01-25 DIAGNOSIS — M1611 Unilateral primary osteoarthritis, right hip: Secondary | ICD-10-CM

## 2019-01-25 DIAGNOSIS — E669 Obesity, unspecified: Secondary | ICD-10-CM

## 2019-01-25 DIAGNOSIS — E66811 Obesity, class 1: Secondary | ICD-10-CM

## 2019-01-25 DIAGNOSIS — R6889 Other general symptoms and signs: Secondary | ICD-10-CM

## 2019-01-25 DIAGNOSIS — Z0001 Encounter for general adult medical examination with abnormal findings: Secondary | ICD-10-CM

## 2019-01-25 DIAGNOSIS — E785 Hyperlipidemia, unspecified: Secondary | ICD-10-CM

## 2019-01-25 DIAGNOSIS — K5730 Diverticulosis of large intestine without perforation or abscess without bleeding: Secondary | ICD-10-CM

## 2019-01-25 DIAGNOSIS — Z0000 Encounter for general adult medical examination without abnormal findings: Secondary | ICD-10-CM

## 2019-01-25 DIAGNOSIS — E559 Vitamin D deficiency, unspecified: Secondary | ICD-10-CM

## 2019-01-25 DIAGNOSIS — R7309 Other abnormal glucose: Secondary | ICD-10-CM | POA: Diagnosis not present

## 2019-01-25 DIAGNOSIS — I10 Essential (primary) hypertension: Secondary | ICD-10-CM

## 2019-02-03 ENCOUNTER — Other Ambulatory Visit: Payer: Self-pay | Admitting: Physician Assistant

## 2019-02-04 ENCOUNTER — Encounter: Payer: Self-pay | Admitting: Orthopaedic Surgery

## 2019-02-04 ENCOUNTER — Other Ambulatory Visit: Payer: Self-pay

## 2019-02-04 ENCOUNTER — Ambulatory Visit (INDEPENDENT_AMBULATORY_CARE_PROVIDER_SITE_OTHER): Payer: PPO | Admitting: Orthopaedic Surgery

## 2019-02-04 VITALS — Ht 72.75 in | Wt 255.0 lb

## 2019-02-04 DIAGNOSIS — M25551 Pain in right hip: Secondary | ICD-10-CM | POA: Diagnosis not present

## 2019-02-04 DIAGNOSIS — M1611 Unilateral primary osteoarthritis, right hip: Secondary | ICD-10-CM

## 2019-02-04 NOTE — Progress Notes (Addendum)
Office Visit Note   Patient: Harold Davis           Date of Birth: 03-13-1951           MRN: [id number] Visit Date: 02/04/2019              Requested by: [name], [address], [place] [address] PCP: [name], MD   Assessment & Plan: Visit Diagnoses:  1. Unilateral primary osteoarthritis, right hip   2. 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  He is tried failed all forms of conservative treatment for well over 2 years now.  This conservative treatment for over 2 years now has included anti-inflammatories, activity modification, stretching exercises, hip strengthening exercises with a home exercise program, weight loss and occasionally walking with a walking stick to offload his painful right hip.  With the significance of his hip arthritis we talked about hip replacement surgery.  I showed him a hip replacement model and went over the surgery in detail.  We talked about the risk and benefits of surgery.  We talked about his intraoperative and postoperative course.  All questions and concerns were answered and addressed.  He is on the schedule for May 22.  I will likely put him on Xarelto for a few weeks postoperative in light of his family history.  Follow-Up Instructions: Return for 2 weeks post-op.   Orders:  No orders of the defined types were placed in this encounter.  No orders of the defined types were placed in this encounter.     Procedures: No procedures performed   Clinical Data: No additional findings.   Subjective: No chief complaint on file. 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  I have been seeing him in the operating room for several years now due to worsening right hip pain.  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  I first examined  his hip in 2018 when he was having significant groin pain and limitations in hip rotation.  X-rays then showed severe end-stage arthritis with superior lateral joint space narrowing as well as particular osteophytes and sclerotic changes.  Over the last 2 years his pain is gotten significantly worse and is gotten to where he can be 10 out of 10 on a daily basis.  It is now gotten to the point where it is detrimentally affected his mobility, his activities daily living and his quality of life.  He is a very active 68 year old gentleman.  He is tried failed all forms of conservative treatment at this standpoint and does wish to proceed with hip replacement surgery.  He has tried anti-inflammatories as well as activity modification.  He works on stretching exercises constantly.  He is tried a home exercise program as well as weight loss.  Occasionally he uses a walking stick to offload his right hip.  He denies any recent acute medical issues.  We did go over his medical history as well as medication history in detail.  Of note he does have a family history of members having pulmonary embolus after procedures.  As far as he knows he has no bleeding issues however he has had retinal issues after being on NSAIDs.  He has gotten to the point that is significantly difficult even  get his shoes and socks on on that right side.  HPI  Review of Systems He currently denies any headache, chest pain, shortness of breath, fever, chills, nausea, vomiting.  Objective: Vital Signs: Ht 6' 0.75" (1.848 m)   Wt 255 lb (115.7 kg)   BMI 33.87 kg/m   Physical Exam He is alert and orient x3 and in no acute distress Ortho Exam Examination of his right hip shows significant limitations with internal and external rotation.  His hip is quite stiff.  It is painful in the groin with rotation.  His left hip exam is normal.  The remainder of his physical exam including head, neck, chest and abdomen are normal. Specialty Comments:   No specialty comments available.  Imaging: No results found. I was able to independently review x-rays over several years as a relates to his right hip.  His hip and pelvic films that were done earlier this year shows severe end-stage arthritis of the right hip.  There now significant cystic changes in the femoral head as well as acetabulum and significant particular osteophytes as well as sclerotic changes and severe joint space narrowing.  PMFS History: Patient Active Problem List   Diagnosis Date Noted  . Unilateral primary osteoarthritis, right hip 02/04/2019  . Arthritis of right hip 01/25/2019  . Other abnormal glucose (prediabetes) 01/22/2019  . Obesity (BMI 30.0-34.9) 01/22/2019  . Hyperlipidemia 01/22/2019  . Vitamin D deficiency 01/22/2019  . Essential hypertension 01/15/2018  . Sigmoid diverticulosis 01/15/2018  . History of diverticulitis 01/15/2018   Past Medical History:  Diagnosis Date  . Cataract 2008   os  . Cataract 2017   od  . Degenerative joint disease (DJD) of hip   . Diverticulitis large intestine   . History of acute pancreatitis 01/15/2018  . Hx of vitrectomy Sept 2010 Left eye  02/22/2015   Retinal detachment surgery per Deloria Lair   . Hyperlipidemia 11/2017   LDL 108  . Hypertension 2009   started HCTZ & switched to Norvasc in Jan 2019  . Pancreatitis 09/2017  . Retinal detachment of left eye with single retinal tear 2010   Vitrectomy - Dr [name]    Family History  Problem Relation Age of Onset  . Stroke Mother   . Cancer Mother   . Cancer Father   . Heart disease Brother   . Cancer Brother   . Cancer Sister     Past Surgical History:  Procedure Laterality Date  . EUS N/A 02/04/2018   Procedure: UPPER ENDOSCOPIC ULTRASOUND (EUS) RADIAL;  Surgeon: Arta Silence, MD;  Location: [facility] ENDOSCOPY;  Service: Endoscopy;  Laterality: N/A;   Social History   Occupational History  . Not on file  Tobacco Use  . Smoking status: Never Smoker  .  Smokeless tobacco: Never Used  Substance and Sexual Activity  . Alcohol use: Yes    Comment: infrequent  . Drug use: Never  . Sexual activity: Yes

## 2019-02-09 ENCOUNTER — Telehealth: Payer: Self-pay

## 2019-02-09 NOTE — Telephone Encounter (Signed)
I received vm from nurse with [employer] and they are needing to know what conservative treatment patient has tried and failed. Can you add this to the last office note and I will send to them? Thanks

## 2019-02-09 NOTE — Telephone Encounter (Signed)
I added this to his 02/04/19 office visit note.

## 2019-02-10 NOTE — Telephone Encounter (Signed)
Faxed completed note to [phone] attn:[id number]/additional clinical

## 2019-02-10 NOTE — Patient Instructions (Signed)
Harold Davis  02/10/2019   Your procedure is scheduled on: 02-19-19   Report to [facility] Main  Entrance    Report to Short Stay at 5:30 AM   YOU NEED TO HAVE A COVID 19 TEST ON 02-16-19 , THIS TEST MUST BE DONE BEFORE SURGERY, COME TO [place] ENTRANCE BETWEEN THE HOURS OF 900 AM AND 300 PM ON YOUR COVID TEST DATE.   Call this number if you have problems the morning of surgery [phone]     Remember: NO SOLID FOOD AFTER MIDNIGHT THE NIGHT PRIOR TO SURGERY. NOTHING BY MOUTH EXCEPT CLEAR LIQUIDS UNTIL 3 HOURS PRIOR TO [place] SURGERY. PLEASE FINISH ENSURE DRINK PER SURGEON ORDER  AT 4:30 AM.   CLEAR LIQUID DIET   Foods Allowed                                                                     Foods Excluded  Coffee and tea, regular and decaf                             liquids that you cannot  Plain Jell-O in any flavor                                             see through such as: Fruit ices (not with fruit pulp)                                     milk, soups, orange juice  Iced Popsicles                                    All solid food Carbonated beverages, regular and diet                                    Cranberry, grape and apple juices Sports drinks like Gatorade Lightly seasoned clear broth or consume(fat free) Sugar, honey syrup  Sample Menu Breakfast                                Lunch                                     Supper Cranberry juice                    Beef broth                            Chicken broth Jell-O  Grape juice                           Apple juice Coffee or tea                        Jell-O                                      Popsicle                                                Coffee or tea                        Coffee or tea  _____________________________________________________________________      BRUSH YOUR TEETH MORNING OF  SURGERY AND RINSE YOUR MOUTH OUT, NO CHEWING GUM CANDY OR MINTS.     Take these medicines the morning of surgery with A SIP OF WATER: Amlodipine (Norvasc)                                You may not have any metal on your body including hair pins and              piercings     Do not wear jewelry, cologne, lotions, powders or deodorant                          Men may shave face and neck.   Do not bring valuables to the hospital. [place].  Contacts, dentures or bridgework may not be worn into surgery.     Special Instructions: N/A              Please read over the following fact sheets you were given: _____________________________________________________________________           [facility] - Preparing for Surgery Before surgery, you can play an important role.  Because skin is not sterile, your skin needs to be as free of germs as possible.  You can reduce the number of germs on your skin by washing with CHG (chlorahexidine gluconate) soap before surgery.  CHG is an antiseptic cleaner which kills germs and bonds with the skin to continue killing germs even after washing. Please DO NOT use if you have an allergy to CHG or antibacterial soaps.  If your skin becomes reddened/irritated stop using the CHG and inform your nurse when you arrive at Short Stay. Do not shave (including legs and underarms) for at least 48 hours prior to the first CHG shower.  You may shave your face/neck. Please follow these instructions carefully:  1.  Shower with CHG Soap the night before surgery and the  morning of Surgery.  2.  If you choose to wash your hair, wash your hair first as usual with your  normal  shampoo.  3.  After you shampoo, rinse your hair and body thoroughly to remove the  shampoo.  4.  Use CHG as you would any other liquid soap.  You can apply chg directly  to the skin and wash                       Gently with a  scrungie or clean washcloth.  5.  Apply the CHG Soap to your body ONLY FROM THE NECK DOWN.   Do not use on face/ open                           Wound or open sores. Avoid contact with eyes, ears mouth and genitals (private parts).                       Wash face,  Genitals (private parts) with your normal soap.             6.  Wash thoroughly, paying special attention to the area where your surgery  will be performed.  7.  Thoroughly rinse your body with warm water from the neck down.  8.  DO NOT shower/wash with your normal soap after using and rinsing off  the CHG Soap.                9.  Pat yourself dry with a clean towel.            10.  Wear clean pajamas.            11.  Place clean sheets on your bed the night of your first shower and do not  sleep with pets. Day of Surgery : Do not apply any lotions/deodorants the morning of surgery.  Please wear clean clothes to the hospital/surgery center.  FAILURE TO FOLLOW THESE INSTRUCTIONS MAY RESULT IN THE CANCELLATION OF YOUR SURGERY PATIENT SIGNATURE_________________________________  NURSE SIGNATURE__________________________________  ________________________________________________________________________   Harold Davis  An incentive spirometer is a tool that can help keep your lungs clear and active. This tool measures how well you are filling your lungs with each breath. Taking long deep breaths may help reverse or decrease the chance of developing breathing (pulmonary) problems (especially infection) following:  A long period of time when you are unable to move or be active. BEFORE THE PROCEDURE   If the spirometer includes an indicator to show your best effort, your nurse or respiratory therapist will set it to a desired goal.  If possible, sit up straight or lean slightly forward. Try not to slouch.  Hold the incentive spirometer in an upright position. INSTRUCTIONS FOR USE  1. Sit on the edge of your bed if possible,  or sit up as far as you can in bed or on a chair. 2. Hold the incentive spirometer in an upright position. 3. Breathe out normally. 4. Place the mouthpiece in your mouth and seal your lips tightly around it. 5. Breathe in slowly and as deeply as possible, raising the piston or the ball toward the top of the column. 6. Hold your breath for 3-5 seconds or for as long as possible. Allow the piston or ball to fall to the bottom of the column. 7. Remove the mouthpiece from your mouth and breathe out normally. 8. Rest for a few seconds and repeat Steps 1 through 7 at least 10 times every 1-2 hours when you are awake. Take your time and take a few normal breaths between deep breaths. 9. The spirometer may include an indicator to  show your best effort. Use the indicator as a goal to work toward during each repetition. 10. After each set of 10 deep breaths, practice coughing to be sure your lungs are clear. If you have an incision (the cut made at the time of surgery), support your incision when coughing by placing a pillow or rolled up towels firmly against it. Once you are able to get out of bed, walk around indoors and cough well. You may stop using the incentive spirometer when instructed by your caregiver.  RISKS AND COMPLICATIONS  Take your time so you do not get dizzy or light-headed.  If you are in pain, you may need to take or ask for pain medication before doing incentive spirometry. It is harder to take a deep breath if you are having pain. AFTER USE  Rest and breathe slowly and easily.  It can be helpful to keep track of a log of your progress. Your caregiver can provide you with a simple table to help with this. If you are using the spirometer at home, follow these instructions: [place] IF:   You are having difficultly using the spirometer.  You have trouble using the spirometer as often as instructed.  Your pain medication is not giving enough relief while using the  spirometer.  You develop fever of 100.5 F (38.1 C) or higher. SEEK IMMEDIATE MEDICAL CARE IF:   You cough up bloody sputum that had not been present before.  You develop fever of 102 F (38.9 C) or greater.  You develop worsening pain at or near the incision site. MAKE SURE YOU:   Understand these instructions.  Will watch your condition.  Will get help right away if you are not doing well or get worse. Document Released: 01/27/2007 Document Revised: 12/09/2011 Document Reviewed: 03/30/2007 [facility] Patient Information 2014 [place], [place].   ________________________________________________________________________

## 2019-02-10 NOTE — Progress Notes (Signed)
SPOKE W/PT _     SCREENING SYMPTOMS OF COVID 19:   COUGH--No  RUNNY NOSE--- No  SORE THROAT---No  NASAL CONGESTION----No  SNEEZING----No  SHORTNESS OF BREATH---No  DIFFICULTY BREATHING---No  TEMP >100.0 -----No  UNEXPLAINED BODY ACHES------No  CHILLS --------No   HEADACHES ---------No  LOSS OF SMELL/ TASTE --------No    HAVE YOU OR ANY FAMILY MEMBER TRAVELLED PAST 14 DAYS OUT OF THE   COUNTY--- to [place] STATE----No COUNTRY----No  HAVE YOU OR ANY FAMILY MEMBER BEEN EXPOSED TO ANYONE WITH COVID 19? No

## 2019-02-11 ENCOUNTER — Other Ambulatory Visit: Payer: Self-pay | Admitting: Internal Medicine

## 2019-02-11 ENCOUNTER — Encounter (HOSPITAL_COMMUNITY): Payer: Self-pay

## 2019-02-11 ENCOUNTER — Ambulatory Visit (INDEPENDENT_AMBULATORY_CARE_PROVIDER_SITE_OTHER): Payer: PPO | Admitting: *Deleted

## 2019-02-11 ENCOUNTER — Inpatient Hospital Stay (HOSPITAL_COMMUNITY)
Admission: RE | Admit: 2019-02-11 | Discharge: 2019-02-11 | Disposition: A | Discharge status: Home / Self Care | Payer: PPO | Source: Ambulatory Visit

## 2019-02-11 ENCOUNTER — Telehealth: Payer: Self-pay | Admitting: Cardiovascular Disease

## 2019-02-11 ENCOUNTER — Encounter (HOSPITAL_COMMUNITY)
Admission: RE | Admit: 2019-02-11 | Discharge: 2019-02-11 | Disposition: A | Discharge status: Home / Self Care | Payer: PPO | Source: Ambulatory Visit | Attending: Orthopaedic Surgery | Admitting: Orthopaedic Surgery

## 2019-02-11 ENCOUNTER — Other Ambulatory Visit: Payer: Self-pay

## 2019-02-11 VITALS — BP 150/86 | HR 76 | Temp 97.6°F | Wt 256.0 lb

## 2019-02-11 DIAGNOSIS — I4891 Unspecified atrial fibrillation: Secondary | ICD-10-CM | POA: Diagnosis not present

## 2019-02-11 DIAGNOSIS — M1611 Unilateral primary osteoarthritis, right hip: Secondary | ICD-10-CM | POA: Insufficient documentation

## 2019-02-11 DIAGNOSIS — Z01818 Encounter for other preprocedural examination: Secondary | ICD-10-CM | POA: Insufficient documentation

## 2019-02-11 DIAGNOSIS — I10 Essential (primary) hypertension: Secondary | ICD-10-CM

## 2019-02-11 DIAGNOSIS — R002 Palpitations: Secondary | ICD-10-CM

## 2019-02-11 LAB — CBC
HCT: 42.7 % (ref 39.0–52.0)
Hemoglobin: 14 g/dL (ref 13.0–17.0)
MCH: 30.4 pg (ref 26.0–34.0)
MCHC: 32.8 g/dL (ref 30.0–36.0)
MCV: 92.6 fL (ref 80.0–100.0)
Platelets: 265 10*3/uL (ref 150–400)
RBC: 4.61 MIL/uL (ref 4.22–5.81)
RDW: 14.1 % (ref 11.5–15.5)
WBC: 5.5 10*3/uL (ref 4.0–10.5)
nRBC: 0 % (ref 0.0–0.2)

## 2019-02-11 LAB — BASIC METABOLIC PANEL
Anion gap: 10 (ref 5–15)
BUN: 18 mg/dL (ref 8–23)
CO2: 24 mmol/L (ref 22–32)
Calcium: 8.9 mg/dL (ref 8.9–10.3)
Chloride: 106 mmol/L (ref 98–111)
Creatinine, Ser: 0.96 mg/dL (ref 0.61–1.24)
GFR calc Af Amer: >60 mL/min (ref >60)
GFR calc non Af Amer: >60 mL/min (ref >60)
Glucose, Bld: 107 mg/dL — ABNORMAL HIGH (ref 70–99)
Potassium: 3.6 mmol/L (ref 3.5–5.1)
Sodium: 140 mmol/L (ref 135–145)

## 2019-02-11 LAB — SURGICAL PCR SCREEN
MRSA, PCR: NEGATIVE
Staphylococcus aureus: NEGATIVE

## 2019-02-11 MED ORDER — DILTIAZEM HCL ER COATED BEADS 240 MG PO CP24: ORAL_CAPSULE | ORAL | 3 refills | Status: DC

## 2019-02-11 NOTE — Addendum Note (Signed)
Addended by: [name] on: 02/11/2019 09:52 PM   Modules accepted: Orders, Level of Service

## 2019-02-11 NOTE — Progress Notes (Addendum)
Patient is here for an EKG.  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     Patient had Anesthesia  Pre Op Evaluation this morning and screening EKG found new Afib. Patient has been asymptomatic w/o o/o HA's, postural dizziness, exertional CP, exertional dyspnea/PND/Orthopnea, but he has had mild dependent edema attributed to Amlodipine. He  denies hx/o palpitations. There is no hx/o CAD or suspect angina. Previous EKG 01/15/2018 showed NSR.       EKG is repeated this afternoon for comparison with our previous EKG and new Afib is confirmed. Patient has indicated strong desire not to pursue cardiac w/u or have CV, but is amenable to have 2D cardiac echo. He is CHADsVasc= 2 and very reluctant to initiate Coumadin or NOAC at  present altho concedes the necessity for PO Xarelto. (has hx/o retinal hemorrhage w/retinal tear on low dose ASA in the past). Electrolyteswere Normal earlier today. Previous Thyroid studies Normal. No recent decongestant use. Does report moderate caffeine intake which he intends to taper.     Patient was advised to stop his Amlodipine and started new Rx Diltiazem 240 XR this am & is advised to take a 2sd dose tonight and then start 1 tablet every morning. Will recheck another EKG in the Office next Tues or Wed before his scheduled surgery next Friday.

## 2019-02-11 NOTE — Telephone Encounter (Signed)
° ° °  Please call [facility] at [phone]

## 2019-02-11 NOTE — Patient Instructions (Signed)
Harold Davis  02/11/2019   Your procedure is scheduled on: 02-19-19    Report to [facility] Main  Entrance     Report to Short Stay 5:30 AM   YOU NEED TO HAVE A COVID 19 TEST ON_5-19-20 , THIS TEST MUST BE DONE BEFORE SURGERY, COME TO [place] ENTRANCE BETWEEN THE HOURS OF 900 AM AND 300 PM ON YOUR COVID TEST DATE.   Call this number if you have problems the morning of surgery [phone]    Remember: NO SOLID FOOD AFTER MIDNIGHT THE NIGHT PRIOR TO SURGERY. NOTHING BY MOUTH EXCEPT CLEAR LIQUIDS UNTIL 3 HOURS PRIOR TO [place] SURGERY. PLEASE FINISH ENSURE DRINK PER SURGEON ORDER 3 HOURS PRIOR TO SCHEDULED SURGERY TIME WHICH NEEDS TO BE COMPLETED AT 4:30 AM.   CLEAR LIQUID DIET   Foods Allowed                                                                     Foods Excluded  Coffee and tea, regular and decaf                             liquids that you cannot  Plain Jell-O in any flavor                                             see through such as: Fruit ices (not with fruit pulp)                                     milk, soups, orange juice  Iced Popsicles                                    All solid food Carbonated beverages, regular and diet                                    Cranberry, grape and apple juices Sports drinks like Gatorade Lightly seasoned clear broth or consume(fat free) Sugar, honey syrup  Sample Menu Breakfast                                Lunch                                     Supper Cranberry juice                    Beef broth                            Chicken broth Jell-O  Grape juice                           Apple juice Coffee or tea                        Jell-O                                      Popsicle                                                Coffee or tea                        Coffee or  tea  _____________________________________________________________________      BRUSH YOUR TEETH MORNING OF SURGERY AND RINSE YOUR MOUTH OUT, NO CHEWING GUM CANDY OR MINTS.     Take these medicines the morning of surgery with A SIP OF WATER: Amlodipine                                You may not have any metal on your body including hair pins and              piercings     Do not wear jewelry, cologne, lotions, powders or perfumes, deodorant                     Men may shave face and neck.   Do not bring valuables to the hospital. [place].  Contacts, dentures or bridgework may not be worn into surgery.    Special Instructions: N/A              Please read over the following fact sheets you were given: _____________________________________________________________________             [facility] - Preparing for Surgery Before surgery, you can play an important role.  Because skin is not sterile, your skin needs to be as free of germs as possible.  You can reduce the number of germs on your skin by washing with CHG (chlorahexidine gluconate) soap before surgery.  CHG is an antiseptic cleaner which kills germs and bonds with the skin to continue killing germs even after washing. Please DO NOT use if you have an allergy to CHG or antibacterial soaps.  If your skin becomes reddened/irritated stop using the CHG and inform your nurse when you arrive at Short Stay. Do not shave (including legs and underarms) for at least 48 hours prior to the first CHG shower.  You may shave your face/neck. Please follow these instructions carefully:  1.  Shower with CHG Soap the night before surgery and the  morning of Surgery.  2.  If you choose to wash your hair, wash your hair first as usual with your  normal  shampoo.  3.  After you shampoo, rinse your hair and body thoroughly to remove the  shampoo.  4.  Use CHG as you would  any other liquid soap.  You can apply chg directly  to the skin and wash                       Gently with a scrungie or clean washcloth.  5.  Apply the CHG Soap to your body ONLY FROM THE NECK DOWN.   Do not use on face/ open                           Wound or open sores. Avoid contact with eyes, ears mouth and genitals (private parts).                       Wash face,  Genitals (private parts) with your normal soap.             6.  Wash thoroughly, paying special attention to the area where your surgery  will be performed.  7.  Thoroughly rinse your body with warm water from the neck down.  8.  DO NOT shower/wash with your normal soap after using and rinsing off  the CHG Soap.                9.  Pat yourself dry with a clean towel.            10.  Wear clean pajamas.            11.  Place clean sheets on your bed the night of your first shower and do not  sleep with pets. Day of Surgery : Do not apply any lotions/deodorants the morning of surgery.  Please wear clean clothes to the hospital/surgery center.  FAILURE TO FOLLOW THESE INSTRUCTIONS MAY RESULT IN THE CANCELLATION OF YOUR SURGERY PATIENT SIGNATURE_________________________________  NURSE SIGNATURE__________________________________  ________________________________________________________________________   Adam Phenix  An incentive spirometer is a tool that can help keep your lungs clear and active. This tool measures how well you are filling your lungs with each breath. Taking long deep breaths may help reverse or decrease the chance of developing breathing (pulmonary) problems (especially infection) following:  A long period of time when you are unable to move or be active. BEFORE THE PROCEDURE   If the spirometer includes an indicator to show your best effort, your nurse or respiratory therapist will set it to a desired goal.  If possible, sit up straight or lean slightly forward. Try not to slouch.  Hold the  incentive spirometer in an upright position. INSTRUCTIONS FOR USE  1. Sit on the edge of your bed if possible, or sit up as far as you can in bed or on a chair. 2. Hold the incentive spirometer in an upright position. 3. Breathe out normally. 4. Place the mouthpiece in your mouth and seal your lips tightly around it. 5. Breathe in slowly and as deeply as possible, raising the piston or the ball toward the top of the column. 6. Hold your breath for 3-5 seconds or for as long as possible. Allow the piston or ball to fall to the bottom of the column. 7. Remove the mouthpiece from your mouth and breathe out normally. 8. Rest for a few seconds and repeat Steps 1 through 7 at least 10 times every 1-2 hours when you are awake. Take your time and take a few normal breaths between deep breaths. 9. The spirometer may include an indicator to  show your best effort. Use the indicator as a goal to work toward during each repetition. 10. After each set of 10 deep breaths, practice coughing to be sure your lungs are clear. If you have an incision (the cut made at the time of surgery), support your incision when coughing by placing a pillow or rolled up towels firmly against it. Once you are able to get out of bed, walk around indoors and cough well. You may stop using the incentive spirometer when instructed by your caregiver.  RISKS AND COMPLICATIONS  Take your time so you do not get dizzy or light-headed.  If you are in pain, you may need to take or ask for pain medication before doing incentive spirometry. It is harder to take a deep breath if you are having pain. AFTER USE  Rest and breathe slowly and easily.  It can be helpful to keep track of a log of your progress. Your caregiver can provide you with a simple table to help with this. If you are using the spirometer at home, follow these instructions: [place] IF:   You are having difficultly using the spirometer.  You have trouble using  the spirometer as often as instructed.  Your pain medication is not giving enough relief while using the spirometer.  You develop fever of 100.5 F (38.1 C) or higher. SEEK IMMEDIATE MEDICAL CARE IF:   You cough up bloody sputum that had not been present before.  You develop fever of 102 F (38.9 C) or greater.  You develop worsening pain at or near the incision site. MAKE SURE YOU:   Understand these instructions.  Will watch your condition.  Will get help right away if you are not doing well or get worse. Document Released: 01/27/2007 Document Revised: 12/09/2011 Document Reviewed: 03/30/2007 [facility] Patient Information 2014 [place], [place].   ________________________________________________________________________

## 2019-02-12 NOTE — Progress Notes (Signed)
Did you need this?

## 2019-02-12 NOTE — Progress Notes (Signed)
I have spoken to Dr. Hassell Done directly and know about this.

## 2019-02-12 NOTE — Telephone Encounter (Signed)
Dr.Croitoru called anesthesia 5/14.

## 2019-02-12 NOTE — Anesthesia Preprocedure Evaluation (Addendum)
Anesthesia Evaluation  Patient identified by MRN, date of birth, ID band Patient awake    Reviewed: Allergy & Precautions, NPO status , Patient's Chart, lab work & pertinent test results  Airway Mallampati: I  TM Distance: >3 FB Neck ROM: Full    Dental no notable dental hx. (+) Teeth Intact   Pulmonary neg pulmonary ROS,    Pulmonary exam normal breath sounds clear to auscultation       Cardiovascular hypertension, Normal cardiovascular exam+ dysrhythmias Atrial Fibrillation  Rhythm:Regular Rate:Normal  18 May 20 Echo  Left Ventricle: The left ventricle has normal systolic function, with an ejection fraction of 60-65%. The cavity size was normal. There is no increase in left ventricular wall thickness. Left ventricular diastolic function could not be evaluated  secondary to atrial fibrillation. Normal left ventricular filling pressures   Neuro/Psych negative neurological ROS     GI/Hepatic Neg liver ROS,   Endo/Other  negative endocrine ROS  Renal/GU Cr 0.96 K+ 3.6     Musculoskeletal   Abdominal   Peds  Hematology Hgb 14.0 Plt 265   Anesthesia Other Findings   Reproductive/Obstetrics                           Anesthesia Physical Anesthesia Plan  ASA: II  Anesthesia Plan: Spinal   Post-op Pain Management:    Induction: Intravenous  PONV Risk Score and Plan: Treatment may vary due to age or medical condition and Ondansetron  Airway Management Planned: Simple Face Mask and Natural Airway  Additional Equipment:   Intra-op Plan:   Post-operative Plan:   Informed Consent: I have reviewed the patients History and Physical, chart, labs and discussed the procedure including the risks, benefits and alternatives for the proposed anesthesia with the patient or authorized representative who has indicated his/her understanding and acceptance.     Dental advisory given  Plan Discussed  with:   Anesthesia Plan Comments: (See PAT note 02/11/2019, Konrad Felix, PA-C  R THR w Spinal)       Anesthesia Quick Evaluation

## 2019-02-12 NOTE — Progress Notes (Addendum)
Anesthesia Chart Review   Case:  [id number] Date/Time:  02/19/19 0659   Procedure:  RIGHT TOTAL HIP ARTHROPLASTY ANTERIOR APPROACH (Right )   Anesthesia type:  Spinal   Pre-op diagnosis:  osteoarthritis right hip   Location:  [facility] ROOM 09 / [facility]   Surgeon:  Mcarthur Rossetti, MD      DISCUSSION:68 yo never smoker with h/o HLD, HTN, right hip OA scheduled for above procedure 02/19/19 with Dr. Jean Rosenthal.   New onset A-fib noted on EKG at PAT 02/11/2019, pt asymptomatic.  Surgeon made aware.  Pt has contacted PCP, Dr. Franne Forts with office visit scheduled.  Per OV note by Dr. Franne Forts pt started on Diltiazem 240 mg.  "Patient has indicated strong desire not to pursue cardiac w/u or have CV, but is amenable to have 2D cardiac echo. He is CHADsVasc= 2 and very reluctant to initiate Coumadin or NOAC at  present altho concedes the necessity for PO Xarelto. (has hx/o retinal hemorrhage w/retinal tear on low dose ASA in the past).  Will recheck another EKG in the Office next Tues or Wed before his scheduled surgery next Friday."  Echo pending, scheduled 02/15/19.    Addendum 02/17/19:  Echo completed 02/16/19 which shows persistent Afib with normal heart valves, normal EF (60-65%), and mild to moderated atrial dilation due to Afib.   Pt followed up with with Dr. Melford Aase 02/16/19 for repeat EKG, persistent Afib. Advised to continue diltiazem 240mg  daily.  VS: BP (!) 147/86 (BP Location: Left Arm)   Pulse 61   Temp 37.2 C (Oral)   Resp 18   Ht 6\' 2"  (1.88 m)   Wt 116.8 kg   SpO2 99%   BMI 33.07 kg/m   PROVIDERS: [name], MD is PCP    LABS: Labs reviewed: Acceptable for surgery. (all labs ordered are listed, but only abnormal results are displayed)  Labs Reviewed  BASIC METABOLIC PANEL - Abnormal; Notable for the following components:      Result Value   Glucose, Bld 107 (*)    All other components within normal limits  SURGICAL PCR SCREEN  CBC      IMAGES:   EKG: 02/11/2019 Rate 67 bpm Atrial fibrillation   CV: Echo 02/15/2019 IMPRESSIONS    1. The left ventricle has normal systolic function with an ejection fraction of 60-65%. The cavity size was normal. Left ventricular diastolic function could not be evaluated secondary to atrial fibrillation.  2. The right ventricle has normal systolic function. The cavity was moderately enlarged.  3. Left atrial size was mildly dilated.  4. Right atrial size was moderately dilated.  5. The mitral valve is grossly normal.  6. The tricuspid valve is grossly normal.  7. The aortic valve is tricuspid. Aortic valve regurgitation is trivial by color flow Doppler.  8. There is mild dilatation of the ascending aorta measuring 38 mm.  9. Normal LV systolic function; mildly dilated ascending aorta; trace AI; mild MR and TR; mild LAE; moderate RAE/RVE. Past Medical History:  Diagnosis Date  . Cataract 2008   os  . Cataract 2017   od  . Degenerative joint disease (DJD) of hip   . Diverticulitis large intestine   . History of acute pancreatitis 01/15/2018  . Hx of vitrectomy Sept 2010 Left eye  02/22/2015   Retinal detachment surgery per Deloria Lair   . Hyperlipidemia 11/2017   LDL 108  . Hypertension 2009   started HCTZ & switched to Norvasc in Jan  2019  . Pancreatitis 09/2017  . Retinal detachment of left eye with single retinal tear 2010   Vitrectomy - Dr [name]    Past Surgical History:  Procedure Laterality Date  . EUS N/A 02/04/2018   Procedure: UPPER ENDOSCOPIC ULTRASOUND (EUS) RADIAL;  Surgeon: Arta Silence, MD;  Location: [facility] ENDOSCOPY;  Service: Endoscopy;  Laterality: N/A;  . EYE SURGERY Bilateral    Left 08/2007, R 02/02/2013    MEDICATIONS: . Ascorbic Acid (VITAMIN C) 1000 MG tablet  . b complex vitamins tablet  . Cholecalciferol (VITAMIN D3) 10000 units TABS  . diltiazem (CARTIA XT) 240 MG 24 hr capsule  . HM OMEGA-3-6-9 FATTY ACIDS CAPS  . LECITHIN PO  . Misc  Natural Products (RESVERATROL DIET PO)  . vitamin E 1000 UNIT capsule  . zinc gluconate 50 MG tablet   No current facility-administered medications for this encounter.    Konrad Felix, PA-C [facility] Pre-Surgical Testing [phone] 02/12/19 1:12 PM

## 2019-02-15 ENCOUNTER — Other Ambulatory Visit: Payer: Self-pay

## 2019-02-15 ENCOUNTER — Ambulatory Visit (HOSPITAL_COMMUNITY): Payer: PPO | Attending: Cardiology

## 2019-02-15 ENCOUNTER — Other Ambulatory Visit: Payer: Self-pay | Admitting: Internal Medicine

## 2019-02-15 DIAGNOSIS — R002 Palpitations: Secondary | ICD-10-CM

## 2019-02-15 DIAGNOSIS — I10 Essential (primary) hypertension: Secondary | ICD-10-CM

## 2019-02-15 DIAGNOSIS — I4891 Unspecified atrial fibrillation: Secondary | ICD-10-CM

## 2019-02-16 ENCOUNTER — Other Ambulatory Visit (HOSPITAL_COMMUNITY)
Admission: RE | Admit: 2019-02-16 | Discharge: 2019-02-16 | Disposition: A | Discharge status: Home / Self Care | Payer: PPO | Source: Ambulatory Visit | Attending: Orthopaedic Surgery | Admitting: Orthopaedic Surgery

## 2019-02-16 ENCOUNTER — Encounter: Payer: Self-pay | Admitting: Internal Medicine

## 2019-02-16 ENCOUNTER — Other Ambulatory Visit: Payer: Self-pay

## 2019-02-16 ENCOUNTER — Ambulatory Visit (INDEPENDENT_AMBULATORY_CARE_PROVIDER_SITE_OTHER): Payer: PPO | Admitting: *Deleted

## 2019-02-16 VITALS — BP 142/82 | HR 64 | Temp 97.0°F | Resp 16 | Wt 256.0 lb

## 2019-02-16 DIAGNOSIS — I4891 Unspecified atrial fibrillation: Secondary | ICD-10-CM | POA: Diagnosis not present

## 2019-02-16 DIAGNOSIS — Z1159 Encounter for screening for other viral diseases: Secondary | ICD-10-CM | POA: Insufficient documentation

## 2019-02-16 NOTE — Progress Notes (Signed)
Patient here to have a NV to recheck an EKG, due to a fib. The patient is currently taking Diltiazem 240 mg 24 hr capsule 2 times a day.  After the EKG, Dr Melford Aase advised the patient he can redurce the Diltiazem 240 mg to 1 capsule daily.

## 2019-02-17 LAB — NOVEL CORONAVIRUS, NAA (HOSP ORDER, SEND-OUT TO REF LAB; TAT 18-24 HRS): SARS-CoV-2, NAA: NOT DETECTED

## 2019-02-18 ENCOUNTER — Other Ambulatory Visit: Payer: Self-pay | Admitting: *Deleted

## 2019-02-18 ENCOUNTER — Other Ambulatory Visit: Payer: Self-pay

## 2019-02-18 NOTE — Progress Notes (Signed)
SPOKE W/  Dr. Hassell Done     SCREENING SYMPTOMS OF COVID 19:   COUGH--no  RUNNY NOSE--- no  SORE THROAT---no  NASAL CONGESTION----no  SNEEZING----no  SHORTNESS OF BREATH---no  DIFFICULTY BREATHING---no  TEMP >100.0 -----no  UNEXPLAINED BODY ACHES------no  CHILLS -------- no  HEADACHES ---------no  LOSS OF SMELL/ TASTE --------no    HAVE YOU OR ANY FAMILY MEMBER TRAVELLED PAST 14 DAYS OUT OF THE   COUNTY--- no STATE----no COUNTRY----no  HAVE YOU OR ANY FAMILY MEMBER BEEN EXPOSED TO ANYONE WITH COVID 19? no

## 2019-02-18 NOTE — Patient Outreach (Signed)
[facility]) Care Management  02/18/2019  DERRICK TIEGS 01-Sep-1951 [id number]   HTA HRA Telephone Screen     Outreach Attempt:  Multiple unsuccessful outreach attempts to patient in the month of April to complete Health Risk Assessment Screening.  HIPAA compliant voice messages left.   Plan:   RN Health Coach has sent Unsuccessful Letter.  RN Health Coach will make another outreach attempt to patient within the month of June.  May [phone] [email or web address]

## 2019-02-19 ENCOUNTER — Ambulatory Visit (HOSPITAL_COMMUNITY): Payer: PPO

## 2019-02-19 ENCOUNTER — Ambulatory Visit (HOSPITAL_COMMUNITY): Payer: PPO | Admitting: Anesthesiology

## 2019-02-19 ENCOUNTER — Observation Stay (HOSPITAL_COMMUNITY)
Admission: RE | Admit: 2019-02-19 | Discharge: 2019-02-20 | Disposition: A | Discharge status: Home / Self Care | Payer: PPO | Attending: Orthopaedic Surgery | Admitting: Orthopaedic Surgery

## 2019-02-19 ENCOUNTER — Encounter (HOSPITAL_COMMUNITY)
Admission: RE | Disposition: A | Discharge status: Home / Self Care | Payer: Self-pay | Source: Home / Self Care | Attending: Orthopaedic Surgery

## 2019-02-19 ENCOUNTER — Encounter (HOSPITAL_COMMUNITY): Payer: Self-pay

## 2019-02-19 ENCOUNTER — Observation Stay (HOSPITAL_COMMUNITY): Payer: PPO

## 2019-02-19 ENCOUNTER — Ambulatory Visit (HOSPITAL_COMMUNITY): Payer: PPO | Admitting: Physician Assistant

## 2019-02-19 DIAGNOSIS — R7303 Prediabetes: Secondary | ICD-10-CM | POA: Insufficient documentation

## 2019-02-19 DIAGNOSIS — Z6832 Body mass index (BMI) 32.0-32.9, adult: Secondary | ICD-10-CM | POA: Diagnosis not present

## 2019-02-19 DIAGNOSIS — E785 Hyperlipidemia, unspecified: Secondary | ICD-10-CM | POA: Insufficient documentation

## 2019-02-19 DIAGNOSIS — M1611 Unilateral primary osteoarthritis, right hip: Principal | ICD-10-CM | POA: Insufficient documentation

## 2019-02-19 DIAGNOSIS — Z79899 Other long term (current) drug therapy: Secondary | ICD-10-CM | POA: Insufficient documentation

## 2019-02-19 DIAGNOSIS — E669 Obesity, unspecified: Secondary | ICD-10-CM | POA: Diagnosis not present

## 2019-02-19 DIAGNOSIS — I4891 Unspecified atrial fibrillation: Secondary | ICD-10-CM | POA: Diagnosis not present

## 2019-02-19 DIAGNOSIS — Z96641 Presence of right artificial hip joint: Secondary | ICD-10-CM | POA: Diagnosis not present

## 2019-02-19 DIAGNOSIS — M25559 Pain in unspecified hip: Secondary | ICD-10-CM

## 2019-02-19 DIAGNOSIS — Z471 Aftercare following joint replacement surgery: Secondary | ICD-10-CM | POA: Diagnosis not present

## 2019-02-19 DIAGNOSIS — I10 Essential (primary) hypertension: Secondary | ICD-10-CM | POA: Diagnosis not present

## 2019-02-19 HISTORY — DX: Presence of right artificial hip joint: Z96.641

## 2019-02-19 HISTORY — PX: TOTAL HIP ARTHROPLASTY: SHX124

## 2019-02-19 SURGERY — ARTHROPLASTY, HIP, TOTAL, ANTERIOR APPROACH
Anesthesia: Spinal | Site: Hip | Laterality: Right

## 2019-02-19 MED ORDER — RIVAROXABAN 10 MG PO TABS
10.0000 mg | ORAL_TABLET | Freq: Every day | ORAL | Status: DC
  Administered 2019-02-20: 10 mg via ORAL
  Filled 2019-02-19: qty 1

## 2019-02-19 MED ORDER — POVIDONE-IODINE 10 % EX SWAB: 2.0000 "application " | Freq: Once | CUTANEOUS | Status: DC

## 2019-02-19 MED ORDER — ONDANSETRON HCL 4 MG/2ML IJ SOLN
INTRAMUSCULAR | Status: DC | PRN
  Administered 2019-02-19: 4 mg via INTRAVENOUS

## 2019-02-19 MED ORDER — TRANEXAMIC ACID-NACL 1000-0.7 MG/100ML-% IV SOLN
INTRAVENOUS | Status: AC
  Filled 2019-02-19: qty 100

## 2019-02-19 MED ORDER — PROPOFOL 500 MG/50ML IV EMUL
INTRAVENOUS | Status: DC | PRN
  Administered 2019-02-19: 75 ug/kg/min via INTRAVENOUS

## 2019-02-19 MED ORDER — B COMPLEX PO TABS: 1.0000 | ORAL_TABLET | Freq: Every day | ORAL | Status: DC

## 2019-02-19 MED ORDER — ACETAMINOPHEN 325 MG PO TABS: 325.0000 mg | ORAL_TABLET | ORAL | Status: DC | PRN

## 2019-02-19 MED ORDER — METHOCARBAMOL 500 MG PO TABS
500.0000 mg | ORAL_TABLET | Freq: Four times a day (QID) | ORAL | Status: DC | PRN
  Administered 2019-02-19 – 2019-02-20 (×3): 500 mg via ORAL
  Filled 2019-02-19 (×3): qty 1

## 2019-02-19 MED ORDER — STERILE WATER FOR IRRIGATION IR SOLN
Status: DC | PRN
  Administered 2019-02-19: 2000 mL

## 2019-02-19 MED ORDER — HYDROMORPHONE HCL 1 MG/ML IJ SOLN
INTRAMUSCULAR | Status: AC
  Filled 2019-02-19: qty 1

## 2019-02-19 MED ORDER — OXYCODONE HCL 5 MG PO TABS: 10.0000 mg | ORAL_TABLET | ORAL | Status: DC | PRN

## 2019-02-19 MED ORDER — BUPIVACAINE IN DEXTROSE 0.75-8.25 % IT SOLN
INTRATHECAL | Status: DC | PRN
  Administered 2019-02-19: 1.8 mL via INTRATHECAL

## 2019-02-19 MED ORDER — HYDROCODONE-ACETAMINOPHEN 7.5-325 MG PO TABS: 1.0000 | ORAL_TABLET | Freq: Once | ORAL | Status: DC | PRN

## 2019-02-19 MED ORDER — ONDANSETRON HCL 4 MG PO TABS: 4.0000 mg | ORAL_TABLET | Freq: Four times a day (QID) | ORAL | Status: DC | PRN

## 2019-02-19 MED ORDER — ACETAMINOPHEN 325 MG PO TABS
325.0000 mg | ORAL_TABLET | Freq: Four times a day (QID) | ORAL | Status: DC | PRN
  Administered 2019-02-19 – 2019-02-20 (×2): 650 mg via ORAL
  Filled 2019-02-19 (×3): qty 2

## 2019-02-19 MED ORDER — ONDANSETRON HCL 4 MG/2ML IJ SOLN: 4.0000 mg | Freq: Once | INTRAMUSCULAR | Status: DC | PRN

## 2019-02-19 MED ORDER — ONDANSETRON HCL 4 MG/2ML IJ SOLN
INTRAMUSCULAR | Status: AC
  Filled 2019-02-19: qty 2

## 2019-02-19 MED ORDER — PHENOL 1.4 % MT LIQD
1.0000 | OROMUCOSAL | Status: DC | PRN
  Filled 2019-02-19: qty 177

## 2019-02-19 MED ORDER — DILTIAZEM HCL ER COATED BEADS 240 MG PO CP24
240.0000 mg | ORAL_CAPSULE | Freq: Every day | ORAL | Status: DC
  Administered 2019-02-20: 240 mg via ORAL
  Filled 2019-02-19: qty 1

## 2019-02-19 MED ORDER — TAMSULOSIN HCL 0.4 MG PO CAPS
0.4000 mg | ORAL_CAPSULE | Freq: Every day | ORAL | Status: DC
  Administered 2019-02-19: 17:00:00 0.4 mg via ORAL
  Filled 2019-02-19: qty 1

## 2019-02-19 MED ORDER — DEXAMETHASONE SODIUM PHOSPHATE 10 MG/ML IJ SOLN
INTRAMUSCULAR | Status: DC | PRN
  Administered 2019-02-19: 5 mg via INTRAVENOUS

## 2019-02-19 MED ORDER — PROPOFOL 10 MG/ML IV BOLUS
INTRAVENOUS | Status: AC
  Filled 2019-02-19: qty 60

## 2019-02-19 MED ORDER — CHLORHEXIDINE GLUCONATE 4 % EX LIQD: 60.0000 mL | Freq: Once | CUTANEOUS | Status: DC

## 2019-02-19 MED ORDER — ACETAMINOPHEN 160 MG/5ML PO SOLN: 325.0000 mg | ORAL | Status: DC | PRN

## 2019-02-19 MED ORDER — METHOCARBAMOL 500 MG IVPB - SIMPLE MED
500.0000 mg | Freq: Four times a day (QID) | INTRAVENOUS | Status: DC | PRN
  Administered 2019-02-19: 09:00:00 500 mg via INTRAVENOUS
  Filled 2019-02-19: qty 50

## 2019-02-19 MED ORDER — MIDAZOLAM HCL 5 MG/5ML IJ SOLN
INTRAMUSCULAR | Status: DC | PRN
  Administered 2019-02-19: 2 mg via INTRAVENOUS

## 2019-02-19 MED ORDER — METOCLOPRAMIDE HCL 5 MG/ML IJ SOLN: 5.0000 mg | Freq: Three times a day (TID) | INTRAMUSCULAR | Status: DC | PRN

## 2019-02-19 MED ORDER — PANTOPRAZOLE SODIUM 40 MG PO TBEC
40.0000 mg | DELAYED_RELEASE_TABLET | Freq: Every day | ORAL | Status: DC
  Administered 2019-02-19 – 2019-02-20 (×2): 40 mg via ORAL
  Filled 2019-02-19 (×2): qty 1

## 2019-02-19 MED ORDER — ZINC GLUCONATE 50 MG PO TABS
50.0000 mg | ORAL_TABLET | ORAL | Status: DC
  Filled 2019-02-19: qty 1

## 2019-02-19 MED ORDER — FENTANYL CITRATE (PF) 100 MCG/2ML IJ SOLN
INTRAMUSCULAR | Status: AC
  Filled 2019-02-19: qty 2

## 2019-02-19 MED ORDER — MENTHOL 3 MG MT LOZG: 1.0000 | LOZENGE | OROMUCOSAL | Status: DC | PRN

## 2019-02-19 MED ORDER — EPHEDRINE SULFATE-NACL 50-0.9 MG/10ML-% IV SOSY
PREFILLED_SYRINGE | INTRAVENOUS | Status: DC | PRN
  Administered 2019-02-19: 5 mg via INTRAVENOUS
  Administered 2019-02-19: 10 mg via INTRAVENOUS

## 2019-02-19 MED ORDER — DOCUSATE SODIUM 100 MG PO CAPS
100.0000 mg | ORAL_CAPSULE | Freq: Two times a day (BID) | ORAL | Status: DC
  Administered 2019-02-19 – 2019-02-20 (×3): 100 mg via ORAL
  Filled 2019-02-19 (×3): qty 1

## 2019-02-19 MED ORDER — SODIUM CHLORIDE 0.9 % IV SOLN
INTRAVENOUS | Status: DC
  Administered 2019-02-20: 01:00:00 via INTRAVENOUS

## 2019-02-19 MED ORDER — HYDROMORPHONE HCL 1 MG/ML IJ SOLN: 0.5000 mg | INTRAMUSCULAR | Status: DC | PRN

## 2019-02-19 MED ORDER — MIDAZOLAM HCL 2 MG/2ML IJ SOLN
INTRAMUSCULAR | Status: AC
  Filled 2019-02-19: qty 2

## 2019-02-19 MED ORDER — ACETAMINOPHEN 10 MG/ML IV SOLN: 1000.0000 mg | Freq: Once | INTRAVENOUS | Status: DC | PRN

## 2019-02-19 MED ORDER — GABAPENTIN 100 MG PO CAPS
100.0000 mg | ORAL_CAPSULE | Freq: Three times a day (TID) | ORAL | Status: DC
  Administered 2019-02-19 – 2019-02-20 (×4): 100 mg via ORAL
  Filled 2019-02-19 (×4): qty 1

## 2019-02-19 MED ORDER — ALUM & MAG HYDROXIDE-SIMETH 200-200-20 MG/5ML PO SUSP: 30.0000 mL | ORAL | Status: DC | PRN

## 2019-02-19 MED ORDER — DIPHENHYDRAMINE HCL 12.5 MG/5ML PO ELIX: 12.5000 mg | ORAL_SOLUTION | ORAL | Status: DC | PRN

## 2019-02-19 MED ORDER — VITAMIN C 500 MG PO TABS
1000.0000 mg | ORAL_TABLET | Freq: Every day | ORAL | Status: DC
  Administered 2019-02-19: 1000 mg via ORAL
  Filled 2019-02-19: qty 2

## 2019-02-19 MED ORDER — OXYCODONE HCL 5 MG PO TABS
5.0000 mg | ORAL_TABLET | ORAL | Status: DC | PRN
  Administered 2019-02-19: 5 mg via ORAL
  Filled 2019-02-19: qty 1
  Filled 2019-02-19: qty 2

## 2019-02-19 MED ORDER — FENTANYL CITRATE (PF) 100 MCG/2ML IJ SOLN
INTRAMUSCULAR | Status: DC | PRN
  Administered 2019-02-19: 100 ug via INTRAVENOUS

## 2019-02-19 MED ORDER — CEFAZOLIN SODIUM-DEXTROSE 2-4 GM/100ML-% IV SOLN
INTRAVENOUS | Status: AC
  Filled 2019-02-19: qty 100

## 2019-02-19 MED ORDER — ONDANSETRON HCL 4 MG/2ML IJ SOLN: 4.0000 mg | Freq: Four times a day (QID) | INTRAMUSCULAR | Status: DC | PRN

## 2019-02-19 MED ORDER — PHENYLEPHRINE 40 MCG/ML (10ML) SYRINGE FOR IV PUSH (FOR BLOOD PRESSURE SUPPORT)
PREFILLED_SYRINGE | INTRAVENOUS | Status: AC
  Filled 2019-02-19: qty 10

## 2019-02-19 MED ORDER — CEFAZOLIN SODIUM-DEXTROSE 2-4 GM/100ML-% IV SOLN
2.0000 g | INTRAVENOUS | Status: AC
  Administered 2019-02-19: 07:00:00 2 g via INTRAVENOUS

## 2019-02-19 MED ORDER — METOCLOPRAMIDE HCL 5 MG PO TABS: 5.0000 mg | ORAL_TABLET | Freq: Three times a day (TID) | ORAL | Status: DC | PRN

## 2019-02-19 MED ORDER — METHOCARBAMOL 500 MG IVPB - SIMPLE MED
INTRAVENOUS | Status: AC
  Filled 2019-02-19: qty 50

## 2019-02-19 MED ORDER — LACTATED RINGERS IV SOLN
INTRAVENOUS | Status: DC
  Administered 2019-02-19 (×2): via INTRAVENOUS

## 2019-02-19 MED ORDER — SODIUM CHLORIDE 0.9 % IR SOLN
Status: DC | PRN
  Administered 2019-02-19: 1000 mL

## 2019-02-19 MED ORDER — PROPOFOL 10 MG/ML IV BOLUS
INTRAVENOUS | Status: AC
  Filled 2019-02-19: qty 20

## 2019-02-19 MED ORDER — VITAMIN D 25 MCG (1000 UNIT) PO TABS
1000.0000 [IU] | ORAL_TABLET | Freq: Every day | ORAL | Status: DC
  Administered 2019-02-19: 12:00:00 1000 [IU] via ORAL
  Filled 2019-02-19: qty 1

## 2019-02-19 MED ORDER — B COMPLEX-C PO TABS
1.0000 | ORAL_TABLET | Freq: Every day | ORAL | Status: DC
  Administered 2019-02-19: 1 via ORAL
  Filled 2019-02-19 (×2): qty 1

## 2019-02-19 MED ORDER — SUCCINYLCHOLINE CHLORIDE 200 MG/10ML IV SOSY
PREFILLED_SYRINGE | INTRAVENOUS | Status: AC
  Filled 2019-02-19: qty 10

## 2019-02-19 MED ORDER — PHENYLEPHRINE 40 MCG/ML (10ML) SYRINGE FOR IV PUSH (FOR BLOOD PRESSURE SUPPORT)
PREFILLED_SYRINGE | INTRAVENOUS | Status: DC | PRN
  Administered 2019-02-19 (×4): 80 ug via INTRAVENOUS

## 2019-02-19 MED ORDER — DEXAMETHASONE SODIUM PHOSPHATE 10 MG/ML IJ SOLN
INTRAMUSCULAR | Status: AC
  Filled 2019-02-19: qty 1

## 2019-02-19 MED ORDER — EPHEDRINE 5 MG/ML INJ
INTRAVENOUS | Status: AC
  Filled 2019-02-19: qty 10

## 2019-02-19 MED ORDER — VITAMIN E 45 MG (100 UNIT) PO CAPS
1000.0000 [IU] | ORAL_CAPSULE | Freq: Every day | ORAL | Status: DC
  Administered 2019-02-19: 1000 [IU] via ORAL
  Filled 2019-02-19 (×2): qty 2

## 2019-02-19 MED ORDER — HYDROMORPHONE HCL 1 MG/ML IJ SOLN: 0.2500 mg | INTRAMUSCULAR | Status: DC | PRN

## 2019-02-19 MED ORDER — 0.9 % SODIUM CHLORIDE (POUR BTL) OPTIME
TOPICAL | Status: DC | PRN
  Administered 2019-02-19: 08:00:00 1000 mL

## 2019-02-19 MED ORDER — CEFAZOLIN SODIUM-DEXTROSE 1-4 GM/50ML-% IV SOLN
1.0000 g | Freq: Four times a day (QID) | INTRAVENOUS | Status: AC
  Administered 2019-02-19 (×2): 1 g via INTRAVENOUS
  Filled 2019-02-19 (×2): qty 50

## 2019-02-19 MED ORDER — TRANEXAMIC ACID-NACL 1000-0.7 MG/100ML-% IV SOLN
1000.0000 mg | INTRAVENOUS | Status: AC
  Administered 2019-02-19: 1000 mg via INTRAVENOUS

## 2019-02-19 MED ORDER — MEPERIDINE HCL 50 MG/ML IJ SOLN: 6.2500 mg | INTRAMUSCULAR | Status: DC | PRN

## 2019-02-19 MED ORDER — PROPOFOL 10 MG/ML IV BOLUS
INTRAVENOUS | Status: DC | PRN
  Administered 2019-02-19: 20 mg via INTRAVENOUS

## 2019-02-19 MED ORDER — POLYETHYLENE GLYCOL 3350 17 G PO PACK: 17.0000 g | PACK | Freq: Every day | ORAL | Status: DC | PRN

## 2019-02-19 SURGICAL SUPPLY — 42 items
APL SKNCLS STERI-STRIP NONHPOA (GAUZE/BANDAGES/DRESSINGS)
BAG SPEC THK2 15X12 ZIP CLS (MISCELLANEOUS)
BAG ZIPLOCK 12X15 (MISCELLANEOUS) IMPLANT
BENZOIN TINCTURE PRP APPL 2/3 (GAUZE/BANDAGES/DRESSINGS) IMPLANT
BLADE SAW SGTL 18X1.27X75 (BLADE) ×2 IMPLANT
BLADE SAW SGTL 18X1.27X75MM (BLADE) ×1
BLADE SURG SZ10 CARB STEEL (BLADE) ×6 IMPLANT
CLOSURE WOUND 1/2 X4 (GAUZE/BANDAGES/DRESSINGS)
COVER PERINEAL POST (MISCELLANEOUS) ×3 IMPLANT
COVER SURGICAL LIGHT HANDLE (MISCELLANEOUS) ×3 IMPLANT
COVER WAND RF STERILE (DRAPES) IMPLANT
CUP ACET PINNACLE SECTR 60MM (Hips) IMPLANT
DRAPE STERI IOBAN 125X83 (DRAPES) ×3 IMPLANT
DRAPE U-SHAPE 47X51 STRL (DRAPES) ×6 IMPLANT
DRSG AQUACEL AG ADV 3.5X10 (GAUZE/BANDAGES/DRESSINGS) ×3 IMPLANT
DURAPREP 26ML APPLICATOR (WOUND CARE) ×3 IMPLANT
ELECT BLADE TIP CTD 4 INCH (ELECTRODE) ×3 IMPLANT
ELECT REM PT RETURN 15FT ADLT (MISCELLANEOUS) ×3 IMPLANT
GAUZE XEROFORM 1X8 LF (GAUZE/BANDAGES/DRESSINGS) ×2 IMPLANT
GLOVE BIO SURGEON STRL SZ7.5 (GLOVE) ×3 IMPLANT
GLOVE BIOGEL PI IND STRL 8 (GLOVE) ×2 IMPLANT
GLOVE BIOGEL PI INDICATOR 8 (GLOVE) ×4
GLOVE ECLIPSE 8.0 STRL XLNG CF (GLOVE) ×3 IMPLANT
GOWN STRL REUS W/TWL XL LVL3 (GOWN DISPOSABLE) ×6 IMPLANT
HANDPIECE INTERPULSE COAX TIP (DISPOSABLE) ×3
HEAD CERAMIC 36 PLUS 8.5 12 14 (Hips) ×2 IMPLANT
HOLDER FOLEY CATH W/STRAP (MISCELLANEOUS) ×3 IMPLANT
KIT TURNOVER KIT A (KITS) IMPLANT
LINER NEUTRAL 58X36MM PLUS4 ×2 IMPLANT
PACK ANTERIOR HIP CUSTOM (KITS) ×3 IMPLANT
PINNSECTOR W/GRIP ACE CUP 60MM (Hips) ×3 IMPLANT
SET HNDPC FAN SPRY TIP SCT (DISPOSABLE) ×1 IMPLANT
STEM FEM ACTIS HIGH SZ10 (Stem) ×2 IMPLANT
STRIP CLOSURE SKIN 1/2X4 (GAUZE/BANDAGES/DRESSINGS) IMPLANT
SUT ETHIBOND NAB CT1 #1 30IN (SUTURE) ×3 IMPLANT
SUT MNCRL AB 4-0 PS2 18 (SUTURE) IMPLANT
SUT VIC AB 0 CT1 36 (SUTURE) ×3 IMPLANT
SUT VIC AB 1 CT1 36 (SUTURE) ×3 IMPLANT
SUT VIC AB 2-0 CT1 27 (SUTURE) ×6
SUT VIC AB 2-0 CT1 TAPERPNT 27 (SUTURE) ×2 IMPLANT
TRAY FOLEY MTR SLVR 16FR STAT (SET/KITS/TRAYS/PACK) ×3 IMPLANT
YANKAUER SUCT BULB TIP 10FT TU (MISCELLANEOUS) ×3 IMPLANT

## 2019-02-19 NOTE — Op Note (Signed)
NAME: DERREL, MOORE MEDICAL RECORD HW:[id number] ACCOUNT [id number] DATE OF BIRTH:[birth year] FACILITY: [facility] LOCATION: [facility] PHYSICIAN:CHRISTOPHER Kerry Fort, MD  OPERATIVE REPORT  DATE OF PROCEDURE:  02/19/2019  PREOPERATIVE DIAGNOSIS:  Primary osteoarthritis and degenerative joint disease, right hip.  POSTOPERATIVE DIAGNOSIS:  Primary osteoarthritis and degenerative joint disease, right hip.  PROCEDURE:  Right total hip arthroplasty through direct anterior approach.  IMPLANTS:  DePuy Sector Gription acetabular component size 60, size 36+4 polyethylene liner, size 10 Actis femoral component with high offset, size 36+8.5 ceramic hip ball.  SURGEON:  Lind Guest. Ninfa Linden, MD  ASSISTANT:  Erskine Emery, PA-C  ANESTHESIA:  Spinal.  ANTIBIOTICS:  Two grams IV Ancef.  ESTIMATED BLOOD LOSS:  350 mL.  COMPLICATIONS:  None.  INDICATIONS:  The patient is a very pleasant 68 year old gentleman who I have been seeing for many years now due to worsening right hip pain and known severe osteoarthritis and degenerative joint disease of his right hip.  At this point, his pain has  become daily and has detrimentally affected his mobility, his quality of life, and his activities of daily living.  He walks with a significant Trendelenburg gait with associated limp.  At this point, he does wish to proceed with total hip arthroplasty.   We had a long and a thorough discussion about the risk of acute blood loss anemia, nerve and vessel injury, fracture, infection, DVT, dislocation, implant failure.  We talked about our goals being decreased pain, improved mobility, and overall improved  quality of life.  DESCRIPTION OF PROCEDURE:  After informed consent was obtained and appropriate right hip was marked, he was brought to the operating room and sat up on a stretcher.  Spinal anesthesia was obtained.  He was then laid in supine position on a stretcher.  I  assessed his leg lengths, and I felt  like his right operative hip side was just slightly shorter.  A Foley catheter was placed.  He was then placed supine on the Hana fracture table with the perineal post in place and both legs in in-line skeletal  traction device and no traction applied.  His right operative hip was prepped and draped with DuraPrep and sterile drapes.  A time-out was called, and he was identified as correct patient, correct right hip.  I then made an incision just inferior and  posterior to the anterior superior iliac spine and carried this obliquely down the leg.  We dissected down the tensor fascia lata muscle.  The tensor fascia was then divided longitudinally to proceed with direct anterior approach to the hip.  We  identified and cauterized circumflex vessels and identified the hip capsule and opened up the hip capsule in an L-type format, finding a moderate joint effusion and significant periarticular osteophytes around the femoral head and neck.  We placed Cobra  retractors around the medial and lateral femoral neck and then made our femoral neck cut with an oscillating saw just proximal to the lesser trochanter.  We completed this with an osteotome.  We placed a corkscrew guide in the femoral head and removed  the femoral head in its entirety and found a wide area completely devoid of cartilage with some cystic changes and hard as a rock with shiny-like marble.  It was definitely consistent with severe osteoarthritis.  We passed this off the back table.  I  then placed a bent Hohmann over the medial acetabular rim and released the remnants of the acetabular labrum and removed that and other debris.  I released the transverse acetabular ligament as well.  I then began reaming under direct visualization from  a size 44 reamer in stepwise increments up to a size 59 with all reamers under direct visualization, the last reamer under direct fluoroscopy so we could obtain our depth of reaming, our inclination and anteversion.   We then placed the real DePuy Sector  Gription acetabular component size 60 and a 36+4 neutral polyethylene liner, having medialized him from what his native position was out, more so from his significant osteophytes.  Attention was then turned to the femur.  With the leg externally rotated  to 120 degrees, extended and adducted, we were able to place a Mueller retractor medially and a Hohmann retractor behind the greater trochanter, released the lateral joint capsule, and used a box-cutting osteotome to enter the femoral canal and a rongeur  to lateralize and then began broaching from a size 0 broach using the Actis broaching system, going up all the way to a size 10.  With the size 10 in place, we trialed a standard offset femoral neck, and I went with a 36+5 hip ball based on the lower  neck cut and more medialization.  We reduced this in the acetabulum, and we felt like we definitely needed a little more offset and leg length.  We dislocated the hip and removed the trial components.  We placed the real Actis femoral component size 10  with high offset.  We went with a 36+8.5 ceramic hip ball, reduced this in the acetabulum.  I was pleased with the stability, and I felt his leg length and offset were ____.  We then irrigated the soft tissue with normal saline solution using pulsatile  lavage.  I was able to reapproximate the joint capsule using #1 Ethibond suture.  We used a running #1 Vicryl to close the tensor fascia, followed by 0 Vicryl to close the deep tissue, 2-0 Vicryl to close subcutaneous tissue, and we placed interrupted  staples on the skin.  Xeroform and an Aquacel dressing were applied.  He was taken off the Hana table and taken to recovery room in stable condition.  All final counts were correct.  There were no complications noted.  Of note Benita Stabile, PA-C, assisted  the entire case.  His assistance was crucial for facilitating all aspects of this case.  LN/NUANCE  D:02/19/2019  T:02/19/2019 JOB:[id number]

## 2019-02-19 NOTE — Brief Op Note (Signed)
02/19/2019  8:39 AM  PATIENT:  Harold Davis  68 y.o. male  PRE-OPERATIVE DIAGNOSIS:  osteoarthritis right hip  POST-OPERATIVE DIAGNOSIS:  osteoarthritis right hip  PROCEDURE:  Procedure(s): RIGHT TOTAL HIP ARTHROPLASTY ANTERIOR APPROACH (Right)  SURGEON:  Surgeon(s) and Role:    Mcarthur Rossetti, MD - Primary  PHYSICIAN ASSISTANT: Benita Stabile, PA-C  ANESTHESIA:   spinal  EBL:  350 mL   COUNTS:  YES  DICTATION: .Other Dictation: Dictation Number [id number]  PLAN OF CARE: Admit to inpatient   PATIENT DISPOSITION:  PACU - hemodynamically stable.   Delay start of Pharmacological VTE agent (>24hrs) due to surgical blood loss or risk of bleeding: no

## 2019-02-19 NOTE — Anesthesia Procedure Notes (Signed)
Procedure Name: MAC Date/Time: 02/19/2019 7:15 AM Performed by: Lollie Sails, CRNA Pre-anesthesia Checklist: Patient identified, Emergency Drugs available, Suction available and Patient being monitored Oxygen Delivery Method: Simple face mask

## 2019-02-19 NOTE — Transfer of Care (Signed)
Immediate Anesthesia Transfer of Care Note  Patient: Harold Davis  Procedure(s) Performed: RIGHT TOTAL HIP ARTHROPLASTY ANTERIOR APPROACH (Right Hip)  Patient Location: PACU  Anesthesia Type:Spinal  Level of Consciousness: sedated and responds to stimulation  Airway & Oxygen Therapy: Patient Spontanous Breathing and Patient connected to face mask oxygen  Post-op Assessment: Report given to RN and Post -op Vital signs reviewed and stable  Post vital signs: Reviewed and stable  Last Vitals:  Vitals Value Taken Time  BP 92/60 02/19/2019  9:00 AM  Temp    Pulse 71 02/19/2019  9:01 AM  Resp 13 02/19/2019  9:01 AM  SpO2 99 % 02/19/2019  9:01 AM  Vitals shown include unvalidated device data.  Last Pain:  Vitals:   02/19/19 0549  TempSrc:   PainSc: 0-No pain         Complications: No apparent anesthesia complications

## 2019-02-19 NOTE — Evaluation (Signed)
Physical Therapy Evaluation Patient Details Name: Harold Davis MRN: [id number] DOB: Nov 27, 1950 Today's Date: 02/19/2019   History of Present Illness  Pt s/p R THR  Clinical Impression  Pt s/p R THR and presents with decreased R LE strength/ROM and post op pain limiting functional mobility.  Pt should progress to dc home with family assist.    Follow Up Recommendations Home health PT;Follow surgeon's recommendation for DC plan and follow-up therapies    Equipment Recommendations  Rolling walker with 5" wheels    Recommendations for Other Services       Precautions / Restrictions Precautions Precautions: Fall Restrictions Weight Bearing Restrictions: No Other Position/Activity Restrictions: wbat      Mobility  Bed Mobility Overal bed mobility: Needs Assistance Bed Mobility: Supine to Sit     Supine to sit: Min assist     General bed mobility comments: cues for sequence and use of L LE to self assist  Transfers Overall transfer level: Needs assistance Equipment used: Rolling walker (2 wheeled) Transfers: Sit to/from Stand Sit to Stand: Min assist         General transfer comment: cues for LE management and use of UEs to self assist  Ambulation/Gait Ambulation/Gait assistance: Min assist Gait Distance (Feet): 100 Feet Assistive device: Rolling walker (2 wheeled) Gait Pattern/deviations: Step-to pattern;Decreased step length - right;Decreased step length - left;Shuffle;Trunk flexed Gait velocity: decr   General Gait Details: cues for posture, position from RW and sequence  Stairs            Wheelchair Mobility    Modified Rankin (Stroke Patients Only)       Balance Overall balance assessment: Mild deficits observed, not formally tested                                           Pertinent Vitals/Pain Pain Assessment: 0-10 Pain Score: 3  Pain Location: R hip/thigh Pain Descriptors / Indicators: Aching;Burning Pain  Intervention(s): Limited activity within patient's tolerance;Monitored during session;Premedicated before session;Ice applied    Home Living Family/patient expects to be discharged to:: Private residence Living Arrangements: Spouse/significant other Available Help at Discharge: Family Type of Home: House Home Access: Stairs to enter Entrance Stairs-Rails: None Entrance Stairs-Number of Steps: 2 Home Layout: One level Home Equipment: Bedside commode;Shower seat;Shower seat - built in      Prior Function Level of Independence: Independent               Journalist, newspaper        Extremity/Trunk Assessment   Upper Extremity Assessment Upper Extremity Assessment: Overall WFL for tasks assessed    Lower Extremity Assessment Lower Extremity Assessment: RLE deficits/detail    Cervical / Trunk Assessment Cervical / Trunk Assessment: Normal  Communication   Communication: No difficulties  Cognition Arousal/Alertness: Awake/alert Behavior During Therapy: WFL for tasks assessed/performed Overall Cognitive Status: Within Functional Limits for tasks assessed                                        General Comments      Exercises Total Joint Exercises Ankle Circles/Pumps: AROM;Both;15 reps;Supine   Assessment/Plan    PT Assessment Patient needs continued PT services  PT Problem List Decreased strength;Decreased range of motion;Decreased balance;Decreased mobility;Decreased knowledge of use of DME;Pain;Decreased knowledge of  precautions       PT Treatment Interventions Gait training;DME instruction;Stair training;Functional mobility training;Therapeutic activities;Therapeutic exercise;Patient/family education    PT Goals (Current goals can be found in the Care Plan section)  Acute Rehab PT Goals Patient Stated Goal: Walk cross country without pain PT Goal Formulation: With patient Time For Goal Achievement: 02/26/19 Potential to Achieve Goals: Good     Frequency 7X/week   Barriers to discharge        Co-evaluation               AM-PAC PT "6 Clicks" Mobility  Outcome Measure Help needed turning from your back to your side while in a flat bed without using bedrails?: A Little Help needed moving from lying on your back to sitting on the side of a flat bed without using bedrails?: A Little Help needed moving to and from a bed to a chair (including a wheelchair)?: A Little Help needed standing up from a chair using your arms (e.g., wheelchair or bedside chair)?: A Little Help needed to walk in hospital room?: A Little Help needed climbing 3-5 steps with a railing? : A Little 6 Click Score: 18    End of Session Equipment Utilized During Treatment: Gait belt Activity Tolerance: Patient tolerated treatment well Patient left: in chair;with call bell/phone within reach;with chair alarm set Nurse Communication: Mobility status PT Visit Diagnosis: Difficulty in walking, not elsewhere classified (R26.2)    Time: 1412-1440 PT Time Calculation (min) (ACUTE ONLY): 28 min   Charges:   PT Evaluation $PT Eval Low Complexity: 1 Low PT Treatments $Gait Training: 8-22 mins        [place] Pager [phone] Office [phone]   Enes Rokosz 02/19/2019, 3:24 PM

## 2019-02-19 NOTE — Anesthesia Procedure Notes (Signed)
Spinal  Patient location during procedure: OR Start time: 02/19/2019 7:00 AM End time: 02/19/2019 7:21 AM Staffing Anesthesiologist: Barnet Glasgow, MD Performed: anesthesiologist  Preanesthetic Checklist Completed: patient identified, site marked, surgical consent, pre-op evaluation, timeout performed, IV checked, risks and benefits discussed and monitors and equipment checked Spinal Block Patient position: sitting Prep: DuraPrep Patient monitoring: heart rate, cardiac monitor, continuous pulse ox and blood pressure Approach: midline Location: L3-4 Injection technique: single-shot Needle Needle type: Sprotte  Needle gauge: 24 G Needle length: 9 cm Needle insertion depth: 7 cm Assessment Sensory level: T4 Additional Notes 1 attempt.

## 2019-02-19 NOTE — Anesthesia Postprocedure Evaluation (Signed)
Anesthesia Post Note  Patient: Harold Davis  Procedure(s) Performed: RIGHT TOTAL HIP ARTHROPLASTY ANTERIOR APPROACH (Right Hip)     Patient location during evaluation: Nursing Unit Anesthesia Type: Spinal Level of consciousness: oriented and awake and alert Pain management: pain level controlled Vital Signs Assessment: post-procedure vital signs reviewed and stable Respiratory status: spontaneous breathing and respiratory function stable Cardiovascular status: blood pressure returned to baseline and stable Postop Assessment: no headache, no backache, no apparent nausea or vomiting and patient able to bend at knees Anesthetic complications: no    Last Vitals:  Vitals:   02/19/19 1133 02/19/19 1244  BP: (!) 128/91 122/69  Pulse: 60 (!) 56  Resp: 16 16  Temp: 36.7 C 36.4 C  SpO2: 100% 97%    Last Pain:  Vitals:   02/19/19 1244  TempSrc: Oral  PainSc:                  Barnet Glasgow

## 2019-02-19 NOTE — H&P (Signed)
TOTAL HIP ADMISSION H&P  Patient is admitted for right total hip arthroplasty.  Subjective:  Chief Complaint: right hip pain  HPI: [name], 68 y.o. male, has a history of pain and functional disability in the right hip(s) due to arthritis and patient has failed non-surgical conservative treatments for greater than 12 weeks to include NSAID's and/or analgesics, flexibility and strengthening excercises, weight reduction as appropriate and activity modification.  Onset of symptoms was gradual starting 4 years ago with gradually worsening course since that time.The patient noted no past surgery on the right hip(s).  Patient currently rates pain in the right hip at 10 out of 10 with activity. Patient has night pain, worsening of pain with activity and weight bearing, trendelenberg gait, pain that interfers with activities of daily living and pain with passive range of motion. Patient has evidence of subchondral cysts, subchondral sclerosis, periarticular osteophytes and joint space narrowing by imaging studies. This condition presents safety issues increasing the risk of falls.  There is no current active infection.  Patient Active Problem List   Diagnosis Date Noted  . Atrial fibrillation with controlled ventricular response ([place]) 02/11/2019  . Unilateral primary osteoarthritis, right hip 02/04/2019  . Arthritis of right hip 01/25/2019  . Other abnormal glucose (prediabetes) 01/22/2019  . Obesity (BMI 30.0-34.9) 01/22/2019  . Hyperlipidemia 01/22/2019  . Vitamin D deficiency 01/22/2019  . Essential hypertension 01/15/2018  . Sigmoid diverticulosis 01/15/2018  . History of diverticulitis 01/15/2018   Past Medical History:  Diagnosis Date  . Cataract 2008   os  . Cataract 2017   od  . Degenerative joint disease (DJD) of hip   . Diverticulitis large intestine   . History of acute pancreatitis 01/15/2018  . Hx of vitrectomy Sept 2010 Left eye  02/22/2015   Retinal detachment surgery  per Deloria Lair   . Hyperlipidemia 11/2017   LDL 108  . Hypertension 2009   started HCTZ & switched to Norvasc in Jan 2019  . Pancreatitis 09/2017  . Retinal detachment of left eye with single retinal tear 2010   Vitrectomy - Dr [name]    Past Surgical History:  Procedure Laterality Date  . EUS N/A 02/04/2018   Procedure: UPPER ENDOSCOPIC ULTRASOUND (EUS) RADIAL;  Surgeon: Arta Silence, MD;  Location: [facility];  Service: Endoscopy;  Laterality: N/A;  . EYE SURGERY Bilateral    Left 08/2007, R 02/02/2013    Current Facility-Administered Medications  Medication Dose Route Frequency Provider Last Rate Last Dose  . ceFAZolin (ANCEF) 2-4 GM/100ML-% IVPB           . ceFAZolin (ANCEF) IVPB 2g/100 mL premix  2 g Intravenous On Call to OR Pete Pelt, PA-C      . chlorhexidine (HIBICLENS) 4 % liquid 4 application  60 mL Topical Once Pete Pelt, PA-C      . lactated ringers infusion   Intravenous Continuous Myrtie Soman, MD 10 mL/hr at 02/19/19 [phone]    . tranexamic acid (CYKLOKAPRON) 1000MG /194mL IVPB           . tranexamic acid (CYKLOKAPRON) IVPB 1,000 mg  1,000 mg Intravenous To OR Pete Pelt, PA-C       No Known Allergies  Social History   Tobacco Use  . Smoking status: Never Smoker  . Smokeless tobacco: Never Used  Substance Use Topics  . Alcohol use: Yes    Comment: infrequent    Family History  Problem Relation Age of Onset  . Stroke Mother   .  Cancer Mother   . Cancer Father   . Heart disease Brother   . Cancer Brother   . Cancer Sister      Review of Systems  Musculoskeletal: Positive for joint pain.  All other systems reviewed and are negative.   Objective:  Physical Exam  Constitutional: He is oriented to person, place, and time. He appears well-developed and well-nourished.  HENT:  Head: Normocephalic and atraumatic.  Eyes: Pupils are equal, round, and reactive to light.  Neck: Normal range of motion.  Cardiovascular: Normal rate. An  irregular rhythm present.  Respiratory: Effort normal.  GI: Soft.  Musculoskeletal:     Right hip: He exhibits decreased range of motion, decreased strength, tenderness and bony tenderness.  Neurological: He is alert and oriented to person, place, and time.  Skin: Skin is warm and dry.  Psychiatric: He has a normal mood and affect.    Vital signs in last 24 hours: Temp:  [97.6 F (36.4 C)] 97.6 F (36.4 C) (05/22 0537) Pulse Rate:  [63] 63 (05/22 0537) Resp:  [14] 14 (05/22 0537) BP: (148)/(77) 148/77 (05/22 0537) SpO2:  [100 %] 100 % (05/22 0537) Weight:  [116.1 kg] 116.1 kg (05/22 0532)  Labs:   Estimated body mass index is 32.87 kg/m as calculated from the following:   Height as of this encounter: 6\' 2"  (1.88 m).   Weight as of this encounter: 116.1 kg.   Imaging Review Plain radiographs demonstrate severe degenerative joint disease of the right hip(s). The bone quality appears to be good for age and reported activity level.      Assessment/Plan:  End stage arthritis, right hip(s)  The patient history, physical examination, clinical judgement of the provider and imaging studies are consistent with end stage degenerative joint disease of the right hip(s) and total hip arthroplasty is deemed medically necessary. The treatment options including medical management, injection therapy, arthroscopy and arthroplasty were discussed at length. The risks and benefits of total hip arthroplasty were presented and reviewed. The risks due to aseptic loosening, infection, stiffness, dislocation/subluxation,  thromboembolic complications and other imponderables were discussed.  The patient acknowledged the explanation, agreed to proceed with the plan and consent was signed. Patient is being admitted for inpatient treatment for surgery, pain control, PT, OT, prophylactic antibiotics, VTE prophylaxis, progressive ambulation and ADL's and discharge planning.The patient is planning to be  discharged home with home health services

## 2019-02-20 DIAGNOSIS — Z96641 Presence of right artificial hip joint: Secondary | ICD-10-CM | POA: Diagnosis not present

## 2019-02-20 DIAGNOSIS — M1611 Unilateral primary osteoarthritis, right hip: Secondary | ICD-10-CM | POA: Diagnosis not present

## 2019-02-20 LAB — CBC
HCT: 38.5 % — ABNORMAL LOW (ref 39.0–52.0)
Hemoglobin: 12.7 g/dL — ABNORMAL LOW (ref 13.0–17.0)
MCH: 30.3 pg (ref 26.0–34.0)
MCHC: 33 g/dL (ref 30.0–36.0)
MCV: 91.9 fL (ref 80.0–100.0)
Platelets: 274 10*3/uL (ref 150–400)
RBC: 4.19 MIL/uL — ABNORMAL LOW (ref 4.22–5.81)
RDW: 13.7 % (ref 11.5–15.5)
WBC: 16.7 10*3/uL — ABNORMAL HIGH (ref 4.0–10.5)
nRBC: 0 % (ref 0.0–0.2)

## 2019-02-20 LAB — BASIC METABOLIC PANEL
Anion gap: 8 (ref 5–15)
BUN: 15 mg/dL (ref 8–23)
CO2: 25 mmol/L (ref 22–32)
Calcium: 8.9 mg/dL (ref 8.9–10.3)
Chloride: 104 mmol/L (ref 98–111)
Creatinine, Ser: 0.8 mg/dL (ref 0.61–1.24)
GFR calc Af Amer: >60 mL/min (ref >60)
GFR calc non Af Amer: >60 mL/min (ref >60)
Glucose, Bld: 138 mg/dL — ABNORMAL HIGH (ref 70–99)
Potassium: 3.6 mmol/L (ref 3.5–5.1)
Sodium: 137 mmol/L (ref 135–145)

## 2019-02-20 MED ORDER — RIVAROXABAN 10 MG PO TABS: 10.0000 mg | ORAL_TABLET | Freq: Every day | ORAL | 0 refills | Status: DC

## 2019-02-20 MED ORDER — METHOCARBAMOL 500 MG PO TABS: 500.0000 mg | ORAL_TABLET | Freq: Four times a day (QID) | ORAL | 0 refills | Status: DC | PRN

## 2019-02-20 MED ORDER — OXYCODONE HCL 5 MG PO TABS: 5.0000 mg | ORAL_TABLET | Freq: Four times a day (QID) | ORAL | 0 refills | Status: DC | PRN

## 2019-02-20 NOTE — Discharge Summary (Signed)
Patient ID: Harold Davis MRN: [id number] DOB/AGE: 1951-02-17 68 y.o.  Admit date: 02/19/2019 Discharge date: 02/20/2019  Admission Diagnoses:  Principal Problem:   Unilateral primary osteoarthritis, right hip Active Problems:   Status post total replacement of right hip   Discharge Diagnoses:  Same  Past Medical History:  Diagnosis Date  . Cataract 2008   os  . Cataract 2017   od  . Degenerative joint disease (DJD) of hip   . Diverticulitis large intestine   . History of acute pancreatitis 01/15/2018  . Hx of vitrectomy Sept 2010 Left eye  02/22/2015   Retinal detachment surgery per Deloria Lair   . Hyperlipidemia 11/2017   LDL 108  . Hypertension 2009   started HCTZ & switched to Norvasc in Jan 2019  . Pancreatitis 09/2017  . Retinal detachment of left eye with single retinal tear 2010   Vitrectomy - Dr [name]    Surgeries: Procedure(s): RIGHT TOTAL HIP ARTHROPLASTY ANTERIOR APPROACH on 02/19/2019   Consultants: Treatment Team:  Alphonsa Overall, MD  Discharged Condition: Improved  Hospital Course: Harold Davis is an 68 y.o. male who was admitted 02/19/2019 for operative treatment ofUnilateral primary osteoarthritis, right hip. Patient has severe unremitting pain that affects sleep, daily activities, and work/hobbies. After pre-op clearance the patient was taken to the operating room on 02/19/2019 and underwent  Procedure(s): RIGHT TOTAL HIP ARTHROPLASTY ANTERIOR APPROACH.    Patient was given perioperative antibiotics:  Anti-infectives (From admission, onward)   Start     Dose/Rate Route Frequency Ordered Stop   02/19/19 1400  ceFAZolin (ANCEF) IVPB 1 g/50 mL premix     1 g 100 mL/hr over 30 Minutes Intravenous Every 6 hours 02/19/19 1033 02/19/19 2029   02/19/19 0600  ceFAZolin (ANCEF) IVPB 2g/100 mL premix     2 g 200 mL/hr over 30 Minutes Intravenous On call to O.R. 02/19/19 3474 02/19/19 0722   02/19/19 0536  ceFAZolin (ANCEF) 2-4 GM/100ML-% IVPB    Note  to Pharmacy:  Kyra Leyland   : cabinet override      02/19/19 0536 02/19/19 2595       Patient was given sequential compression devices, early ambulation, and chemoprophylaxis to prevent DVT.  Patient benefited maximally from hospital stay and there were no complications.    Recent vital signs:  Patient Vitals for the past 24 hrs:  BP Temp Temp src Pulse Resp SpO2  02/20/19 1040 138/74 (!) 97.4 F (36.3 C) Oral 63 16 99 %  02/20/19 0543 132/89 97.8 F (36.6 C) Oral 74 20 99 %  02/20/19 0059 (!) 139/92 97.9 F (36.6 C) Oral 67 20 98 %  02/19/19 2156 135/87 98.3 F (36.8 C) Oral 63 20 95 %  02/19/19 1814 129/82 97.8 F (36.6 C) Oral 68 16 94 %  02/19/19 1341 130/69 (!) 97.5 F (36.4 C) Oral (!) 56 16 94 %  02/19/19 1244 122/69 97.6 F (36.4 C) Oral (!) 56 16 97 %     Recent laboratory studies:  Recent Labs    02/20/19 0336  WBC 16.7*  HGB 12.7*  HCT 38.5*  PLT 274  NA 137  K 3.6  CL 104  CO2 25  BUN 15  CREATININE 0.80  GLUCOSE 138*  CALCIUM 8.9     Discharge Medications:   Allergies as of 02/20/2019   No Known Allergies     Medication List    TAKE these medications   b complex vitamins tablet Take 1 tablet by  mouth daily.   diltiazem 240 MG 24 hr capsule Commonly known as:  Cartia XT Take 1 capsule Daily for BP   HM Omega-3-6-9 Fatty Acids Caps Takes Daily What changed:    how much to take  how to take this  when to take this  additional instructions   LECITHIN PO Take 1 tablet by mouth daily.   methocarbamol 500 MG tablet Commonly known as:  ROBAXIN Take 1 tablet (500 mg total) by mouth every 6 (six) hours as needed for muscle spasms.   oxyCODONE 5 MG immediate release tablet Commonly known as:  Oxy IR/ROXICODONE Take 1-2 tablets (5-10 mg total) by mouth every 6 (six) hours as needed for moderate pain (pain score 4-6).   RESVERATROL DIET PO Take 1 capsule by mouth daily.   rivaroxaban 10 MG Tabs tablet Commonly known as:   XARELTO Take 1 tablet (10 mg total) by mouth daily with breakfast. Start taking on:  Feb 21, 2019   vitamin C 1000 MG tablet Takes Daily What changed:    how much to take  how to take this  when to take this  additional instructions   Vitamin D3 250 MCG (10000 UT) Tabs Take 10,000 Units by mouth daily.   vitamin E 1000 UNIT capsule Take 1 capsule (1,000 Units total) by mouth daily.   zinc gluconate 50 MG tablet Takes 1 tablet every other day What changed:    how much to take  how to take this  when to take this  additional instructions            Durable Medical Equipment  (From admission, onward)         Start     Ordered   02/19/19 1034  DME Walker rolling  Once    Question:  Patient needs a walker to treat with the following condition  Answer:  Status post total replacement of right hip   02/19/19 1033   02/19/19 1034  DME 3 n 1  Once     02/19/19 1033          Diagnostic Studies: Dg Pelvis Portable  Result Date: 02/19/2019 CLINICAL DATA:  Postop right hip replacement EXAM: PORTABLE PELVIS 1-2 VIEWS COMPARISON:  None. FINDINGS: Interval right total hip arthroplasty. No fracture or dislocation. Postsurgical changes in the surrounding soft tissues. No aggressive osseous lesion. IMPRESSION: Interval right total hip arthroplasty. Electronically Signed   By: Kathreen Devoid   On: 02/19/2019 10:53   Dg C-arm 1-60 Min-no Report  Result Date: 02/19/2019 Fluoroscopy was utilized by the requesting physician.  No radiographic interpretation.   Dg Hip Operative Unilat With Pelvis Right  Result Date: 02/19/2019 CLINICAL DATA:  Status post right hip replacement. EXAM: OPERATIVE right HIP (WITH PELVIS IF PERFORMED) 2 VIEWS TECHNIQUE: Fluoroscopic spot image(s) were submitted for interpretation post-operatively. Radiation exposure index: 5.88 mGy. COMPARISON:  Radiographs of November 30, 2018. FINDINGS: Two intraoperative fluoroscopic images of the right hip demonstrate  the femoral and acetabular components to be well situated. Expected postoperative changes seen in the surrounding soft tissues. IMPRESSION: Status post right total hip arthroplasty. Electronically Signed   By: Marijo Conception M.D.   On: 02/19/2019 09:50    Disposition: Discharge disposition: 01-Home or [facility] Follow up.   Specialty:  Home Health Services Contact information: [address] [place] [address] [phone]  Mcarthur Rossetti, MD. Schedule an appointment as soon as possible for a visit in 2 week(s).   Specialty:  Orthopedic Surgery Contact information: [place] [address] [phone]            Signed: Mcarthur Rossetti 02/20/2019, 12:01 PM

## 2019-02-20 NOTE — Progress Notes (Signed)
Physical Therapy Treatment Patient Details Name: Harold Davis MRN: [id number] DOB: 1950-11-03 Today's Date: 02/20/2019    History of Present Illness Pt s/p R THR    PT Comments    Pt motivated to progress but ltd this am by nausea/mild dizziness with ambulation.   Follow Up Recommendations  Home health PT;Follow surgeon's recommendation for DC plan and follow-up therapies     Equipment Recommendations  Rolling walker with 5" wheels    Recommendations for Other Services       Precautions / Restrictions Precautions Precautions: Fall Restrictions Weight Bearing Restrictions: No Other Position/Activity Restrictions: wbat    Mobility  Bed Mobility Overal bed mobility: Needs Assistance Bed Mobility: Supine to Sit;Sit to Supine     Supine to sit: Min guard Sit to supine: Min guard   General bed mobility comments: cues for sequence and use of L LE to self assist  Transfers Overall transfer level: Needs assistance Equipment used: Rolling walker (2 wheeled) Transfers: Sit to/from Stand Sit to Stand: Min guard;Supervision         General transfer comment: cues for LE management and use of UEs to self assist  Ambulation/Gait Ambulation/Gait assistance: Min assist;Min guard Gait Distance (Feet): 28 Feet Assistive device: Rolling walker (2 wheeled) Gait Pattern/deviations: Step-to pattern;Decreased step length - right;Decreased step length - left;Shuffle;Trunk flexed Gait velocity: decr   General Gait Details: cues for posture, position from RW and sequence; distance ltd by onset nausea and mild dizziness - BP 104/70 on return to sitting   Stairs             Wheelchair Mobility    Modified Rankin (Stroke Patients Only)       Balance Overall balance assessment: Mild deficits observed, not formally tested                                          Cognition Arousal/Alertness: Awake/alert Behavior During Therapy: WFL for tasks  assessed/performed Overall Cognitive Status: Within Functional Limits for tasks assessed                                        Exercises Total Joint Exercises Ankle Circles/Pumps: AROM;Both;15 reps;Supine Quad Sets: AROM;Both;10 reps;Supine Heel Slides: AAROM;Right;20 reps;Supine Hip ABduction/ADduction: AAROM;Right;20 reps;Supine    General Comments        Pertinent Vitals/Pain Pain Assessment: 0-10 Pain Score: 4  Pain Location: R hip/thigh Pain Descriptors / Indicators: Burning;Tightness Pain Intervention(s): Limited activity within patient's tolerance;Monitored during session;Premedicated before session;Ice applied    Home Living                      Prior Function            PT Goals (current goals can now be found in the care plan section) Acute Rehab PT Goals Patient Stated Goal: Walk cross country without pain PT Goal Formulation: With patient Time For Goal Achievement: 02/26/19 Potential to Achieve Goals: Good Progress towards PT goals: Progressing toward goals    Frequency    7X/week      PT Plan Current plan remains appropriate    Co-evaluation              AM-PAC PT "6 Clicks" Mobility   Outcome Measure  Help needed turning from  your back to your side while in a flat bed without using bedrails?: A Little Help needed moving from lying on your back to sitting on the side of a flat bed without using bedrails?: A Little Help needed moving to and from a bed to a chair (including a wheelchair)?: A Little Help needed standing up from a chair using your arms (e.g., wheelchair or bedside chair)?: A Little Help needed to walk in hospital room?: A Little Help needed climbing 3-5 steps with a railing? : A Little 6 Click Score: 18    End of Session Equipment Utilized During Treatment: Gait belt Activity Tolerance: Patient tolerated treatment well Patient left: in chair;with call bell/phone within reach;with chair alarm  set Nurse Communication: Mobility status PT Visit Diagnosis: Difficulty in walking, not elsewhere classified (R26.2)     Time: 5038-8828 PT Time Calculation (min) (ACUTE ONLY): 31 min  Charges:  $Gait Training: 8-22 mins $Therapeutic Exercise: 8-22 mins                     [place] Pager [phone] Office [phone]    Felishia Wartman 02/20/2019, 2:56 PM

## 2019-02-20 NOTE — Progress Notes (Signed)
Subjective: 1 Day Post-Op Procedure(s) (LRB): RIGHT TOTAL HIP ARTHROPLASTY ANTERIOR APPROACH (Right) Patient reports pain as moderate.    Objective: Vital signs in last 24 hours: Temp:  [97.4 F (36.3 C)-98.3 F (36.8 C)] 97.4 F (36.3 C) (05/23 1040) Pulse Rate:  [56-74] 63 (05/23 1040) Resp:  [16-20] 16 (05/23 1040) BP: (122-139)/(69-92) 138/74 (05/23 1040) SpO2:  [94 %-99 %] 99 % (05/23 1040)  Intake/Output from previous day: 05/22 0701 - 05/23 0700 In: 4044.9 [P.O.:940; I.V.:2754.9; IV Piggyback:350] Out: 5400 [Urine:5050; Blood:350] Intake/Output this shift: Total I/O In: 240 [P.O.:240] Out: 200 [Urine:200]  Recent Labs    02/20/19 0336  HGB 12.7*   Recent Labs    02/20/19 0336  WBC 16.7*  RBC 4.19*  HCT 38.5*  PLT 274   Recent Labs    02/20/19 0336  NA 137  K 3.6  CL 104  CO2 25  BUN 15  CREATININE 0.80  GLUCOSE 138*  CALCIUM 8.9   No results for input(s): LABPT, INR in the last 72 hours.  Sensation intact distally Intact pulses distally Dorsiflexion/Plantar flexion intact Incision: dressing C/D/I   Assessment/Plan: 1 Day Post-Op Procedure(s) (LRB): RIGHT TOTAL HIP ARTHROPLASTY ANTERIOR APPROACH (Right) Up with therapy Discharge home with home health this afternoon if doing well.      Mcarthur Rossetti 02/20/2019, 11:55 AM

## 2019-02-20 NOTE — Progress Notes (Signed)
RN reviewed discharge instructions with patient and family.   Paperwork given to patient.   RN and NT rolled patient down with all belongings to family car.

## 2019-02-20 NOTE — Progress Notes (Signed)
Physical Therapy Treatment Patient Details Name: Harold Davis MRN: [id number] DOB: 07/25/1951 Today's Date: 02/20/2019    History of Present Illness Pt s/p R THR    PT Comments    Marked improvement in activity tolerance from am session and no c/o nausea or dizziness.  Pt ambulated increased distance in hall, negotiated stairs, and reviewed car transfers and shower transfers.   Follow Up Recommendations  Home health PT;Follow surgeon's recommendation for DC plan and follow-up therapies     Equipment Recommendations  Rolling walker with 5" wheels    Recommendations for Other Services       Precautions / Restrictions Precautions Precautions: Fall Restrictions Weight Bearing Restrictions: No Other Position/Activity Restrictions: wbat    Mobility  Bed Mobility Overal bed mobility: Needs Assistance Bed Mobility: Supine to Sit;Sit to Supine     Supine to sit: Supervision Sit to supine: Supervision   General bed mobility comments: Pt self cueing and using L LE to assist  R LE  Transfers Overall transfer level: Needs assistance Equipment used: Rolling walker (2 wheeled) Transfers: Sit to/from Stand Sit to Stand: Min guard;Supervision         General transfer comment: cues for LE management and use of UEs to self assist  Ambulation/Gait Ambulation/Gait assistance: Min guard;Supervision Gait Distance (Feet): 200 Feet Assistive device: Rolling walker (2 wheeled) Gait Pattern/deviations: Step-to pattern;Decreased step length - right;Decreased step length - left;Shuffle;Trunk flexed Gait velocity: decr   General Gait Details: cues for posture, position from RW and sequence;    Stairs Stairs: Yes Stairs assistance: Min assist Stair Management: No rails;Step to pattern;Forwards;With walker Number of Stairs: 6 General stair comments: 2 steps fwd with RW at top of stairs; 2 steps twice bkwd with RW; cues for sequence and foot/RW placement   Wheelchair Mobility    Modified Rankin (Stroke Patients Only)       Balance Overall balance assessment: Mild deficits observed, not formally tested                                          Cognition Arousal/Alertness: Awake/alert Behavior During Therapy: WFL for tasks assessed/performed Overall Cognitive Status: Within Functional Limits for tasks assessed                                        Exercises Total Joint Exercises Ankle Circles/Pumps: AROM;Both;15 reps;Supine Quad Sets: AROM;Both;10 reps;Supine Heel Slides: AAROM;Right;20 reps;Supine Hip ABduction/ADduction: AAROM;Right;20 reps;Supine Long Arc Quad: AROM;Right;10 reps;Seated    General Comments        Pertinent Vitals/Pain Pain Assessment: 0-10 Pain Score: 4  Pain Location: R hip/thigh Pain Descriptors / Indicators: Burning;Tightness Pain Intervention(s): Limited activity within patient's tolerance;Monitored during session;Premedicated before session;Ice applied    Home Living                      Prior Function            PT Goals (current goals can now be found in the care plan section) Acute Rehab PT Goals Patient Stated Goal: Walk [place] country without pain PT Goal Formulation: With patient Time For Goal Achievement: 02/26/19 Potential to Achieve Goals: Good Progress towards PT goals: Progressing toward goals    Frequency    7X/week      PT  Plan Current plan remains appropriate    Co-evaluation              AM-PAC PT "6 Clicks" Mobility   Outcome Measure  Help needed turning from your back to your side while in a flat bed without using bedrails?: A Little Help needed moving from lying on your back to sitting on the side of a flat bed without using bedrails?: None Help needed moving to and from a bed to a chair (including a wheelchair)?: A Little Help needed standing up from a chair using your arms (e.g., wheelchair or bedside chair)?: A Little Help needed  to walk in hospital room?: A Little Help needed climbing 3-5 steps with a railing? : A Little 6 Click Score: 19    End of Session Equipment Utilized During Treatment: Gait belt Activity Tolerance: Patient tolerated treatment well Patient left: in bed;with call bell/phone within reach Nurse Communication: Mobility status PT Visit Diagnosis: Difficulty in walking, not elsewhere classified (R26.2)     Time: 9798-9211 PT Time Calculation (min) (ACUTE ONLY): 47 min  Charges:  $Gait Training: 8-22 mins $Therapeutic Exercise: 8-22 mins $Therapeutic Activity: 8-22 mins                     [place] Pager [phone] Office [phone]    Ricard Faulkner 02/20/2019, 3:07 PM

## 2019-02-20 NOTE — Care Management (Signed)
Pt for home health with [facility]. RW and 3in1 ordered from [employer]. Marney Doctor RN,BSN [phone]

## 2019-02-20 NOTE — Discharge Instructions (Signed)
Information on my medicine - XARELTO (Rivaroxaban)   Why was Xarelto prescribed for you? Xarelto was prescribed for you to reduce the risk of blood clots forming after orthopedic surgery. The medical term for these abnormal blood clots is venous thromboembolism (VTE).  What do you need to know about xarelto ? Take your Xarelto ONCE DAILY at the same time every day. You may take it either with or without food.  If you have difficulty swallowing the tablet whole, you may crush it and mix in applesauce just prior to taking your dose.  Take Xarelto exactly as prescribed by your doctor and DO NOT stop taking Xarelto without talking to the doctor who prescribed the medication.  Stopping without other VTE prevention medication to take the place of Xarelto may increase your risk of developing a clot.  After discharge, you should have regular check-up appointments with your healthcare provider that is prescribing your Xarelto.    What do you do if you miss a dose? If you miss a dose, take it as soon as you remember on the same day then continue your regularly scheduled once daily regimen the next day. Do not take two doses of Xarelto on the same day.   Important Safety Information A possible side effect of Xarelto is bleeding. You should call your healthcare provider right away if you experience any of the following: ? Bleeding from an injury or your nose that does not stop. ? Unusual colored urine (red or dark brown) or unusual colored stools (red or black). ? Unusual bruising for unknown reasons. ? A serious fall or if you hit your head (even if there is no bleeding).  Some medicines may interact with Xarelto and might increase your risk of bleeding while on Xarelto. To help avoid this, consult your healthcare provider or pharmacist prior to using any new prescription or non-prescription medications, including herbals, vitamins, non-steroidal anti-inflammatory drugs (NSAIDs) and  supplements.  This website has more information on Xarelto: [email or web address].[email or web address]/.  INSTRUCTIONS AFTER JOINT REPLACEMENT   o Remove items at home which could result in a fall. This includes throw rugs or furniture in walking pathways o ICE to the affected joint every three hours while awake for 30 minutes at a time, for at least the first 3-5 days, and then as needed for pain and swelling.  Continue to use ice for pain and swelling. You may notice swelling that will progress down to the foot and ankle.  This is normal after surgery.  Elevate your leg when you are not up walking on it.   o Continue to use the breathing machine you got in the hospital (incentive spirometer) which will help keep your temperature down.  It is common for your temperature to cycle up and down following surgery, especially at night when you are not up moving around and exerting yourself.  The breathing machine keeps your lungs expanded and your temperature down.   DIET:  As you were doing prior to hospitalization, we recommend a well-balanced diet.  DRESSING / WOUND CARE / SHOWERING  Keep the surgical dressing until follow up.  The dressing is water proof, so you can shower without any extra covering.  IF THE DRESSING FALLS OFF or the wound gets wet inside, change the dressing with sterile gauze.  Please use good hand washing techniques before changing the dressing.  Do not use any lotions or creams on the incision until instructed by your surgeon.    ACTIVITY  o Increase activity  slowly as tolerated, but follow the weight bearing instructions below.   o No driving for 6 weeks or until further direction given by your physician.  You cannot drive while taking narcotics.  o No lifting or carrying greater than 10 lbs. until further directed by your surgeon. o Avoid periods of inactivity such as sitting longer than an hour when not asleep. This helps prevent blood clots.  o You may return to work once you are authorized by  your doctor.     WEIGHT BEARING   Weight bearing as tolerated with assist device (walker, cane, etc) as directed, use it as long as suggested by your surgeon or therapist, typically at least 4-6 weeks.   EXERCISES  Results after joint replacement surgery are often greatly improved when you follow the exercise, range of motion and muscle strengthening exercises prescribed by your doctor. Safety measures are also important to protect the joint from further injury. Any time any of these exercises cause you to have increased pain or swelling, decrease what you are doing until you are comfortable again and then slowly increase them. If you have problems or questions, call your caregiver or physical therapist for advice.   Rehabilitation is important following a joint replacement. After just a few days of immobilization, the muscles of the leg can become weakened and shrink (atrophy).  These exercises are designed to build up the tone and strength of the thigh and leg muscles and to improve motion. Often times heat used for twenty to thirty minutes before working out will loosen up your tissues and help with improving the range of motion but do not use heat for the first two weeks following surgery (sometimes heat can increase post-operative swelling).   These exercises can be done on a training (exercise) mat, on the floor, on a table or on a bed. Use whatever works the best and is most comfortable for you.    Use music or television while you are exercising so that the exercises are a pleasant break in your day. This will make your life better with the exercises acting as a break in your routine that you can look forward to.   Perform all exercises about fifteen times, three times per day or as directed.  You should exercise both the operative leg and the other leg as well.  Exercises include:    Quad Sets - Tighten up the muscle on the front of the thigh (Quad) and hold for 5-10 seconds.     Straight Leg Raises - With your knee straight (if you were given a brace, keep it on), lift the leg to 60 degrees, hold for 3 seconds, and slowly lower the leg.  Perform this exercise against resistance later as your leg gets stronger.   Leg Slides: Lying on your back, slowly slide your foot toward your buttocks, bending your knee up off the floor (only go as far as is comfortable). Then slowly slide your foot back down until your leg is flat on the floor again.   Angel Wings: Lying on your back spread your legs to the side as far apart as you can without causing discomfort.   Hamstring Strength:  Lying on your back, push your heel against the floor with your leg straight by tightening up the muscles of your buttocks.  Repeat, but this time bend your knee to a comfortable angle, and push your heel against the floor.  You may put a pillow under the heel to make  it more comfortable if necessary.   A rehabilitation program following joint replacement surgery can speed recovery and prevent re-injury in the future due to weakened muscles. Contact your doctor or a physical therapist for more information on knee rehabilitation.    CONSTIPATION  Constipation is defined medically as fewer than three stools per week and severe constipation as less than one stool per week.  Even if you have a regular bowel pattern at home, your normal regimen is likely to be disrupted due to multiple reasons following surgery.  Combination of anesthesia, postoperative narcotics, change in appetite and fluid intake all can affect your bowels.   YOU MUST use at least one of the following options; they are listed in order of increasing strength to get the job done.  They are all available over the counter, and you may need to use some, POSSIBLY even all of these options:    Drink plenty of fluids (prune juice may be helpful) and high fiber foods Colace 100 mg by mouth twice a day  Senokot for constipation as directed and as  needed Dulcolax (bisacodyl), take with full glass of water  Miralax (polyethylene glycol) once or twice a day as needed.  If you have tried all these things and are unable to have a bowel movement in the first 3-4 days after surgery call either your surgeon or your primary doctor.    If you experience loose stools or diarrhea, hold the medications until you stool forms back up.  If your symptoms do not get better within 1 week or if they get worse, check with your doctor.  If you experience "the worst abdominal pain ever" or develop nausea or vomiting, please contact the office immediately for further recommendations for treatment.   ITCHING:  If you experience itching with your medications, try taking only a single pain pill, or even half a pain pill at a time.  You can also use Benadryl over the counter for itching or also to help with sleep.   TED HOSE STOCKINGS:  Use stockings on both legs until for at least 2 weeks or as directed by physician office. They may be removed at night for sleeping.  MEDICATIONS:  See your medication summary on the After Visit Summary that nursing will review with you.  You may have some home medications which will be placed on hold until you complete the course of blood thinner medication.  It is important for you to complete the blood thinner medication as prescribed.  PRECAUTIONS:  If you experience chest pain or shortness of breath - call 911 immediately for transfer to the hospital emergency department.   If you develop a fever greater that 101 F, purulent drainage from wound, increased redness or drainage from wound, foul odor from the wound/dressing, or calf pain - CONTACT YOUR SURGEON.                                                   FOLLOW-UP APPOINTMENTS:  If you do not already have a post-op appointment, please call the office for an appointment to be seen by your surgeon.  Guidelines for how soon to be seen are listed in your After Visit Summary, but  are typically between 1-4 weeks after surgery.  OTHER INSTRUCTIONS:   Knee Replacement:  Do not place pillow under knee, focus  on keeping the knee straight while resting. CPM instructions: 0-90 degrees, 2 hours in the morning, 2 hours in the afternoon, and 2 hours in the evening. Place foam block, curve side up under heel at all times except when in CPM or when walking.  DO NOT modify, tear, cut, or change the foam block in any way.  MAKE SURE YOU:   Understand these instructions.   Get help right away if you are not doing well or get worse.    Thank you for letting us be a part of your medical care team.  It is a privilege we respect greatly.  We hope these instructions will help you stay on track for a fast and full recovery!

## 2019-02-22 DIAGNOSIS — I10 Essential (primary) hypertension: Secondary | ICD-10-CM | POA: Diagnosis not present

## 2019-02-22 DIAGNOSIS — Z7901 Long term (current) use of anticoagulants: Secondary | ICD-10-CM | POA: Diagnosis not present

## 2019-02-22 DIAGNOSIS — Z96641 Presence of right artificial hip joint: Secondary | ICD-10-CM | POA: Diagnosis not present

## 2019-02-22 DIAGNOSIS — E669 Obesity, unspecified: Secondary | ICD-10-CM | POA: Diagnosis not present

## 2019-02-22 DIAGNOSIS — Z6834 Body mass index (BMI) 34.0-34.9, adult: Secondary | ICD-10-CM | POA: Diagnosis not present

## 2019-02-22 DIAGNOSIS — E559 Vitamin D deficiency, unspecified: Secondary | ICD-10-CM | POA: Diagnosis not present

## 2019-02-22 DIAGNOSIS — Z471 Aftercare following joint replacement surgery: Secondary | ICD-10-CM | POA: Diagnosis not present

## 2019-02-22 DIAGNOSIS — E785 Hyperlipidemia, unspecified: Secondary | ICD-10-CM | POA: Diagnosis not present

## 2019-02-22 DIAGNOSIS — I4891 Unspecified atrial fibrillation: Secondary | ICD-10-CM | POA: Diagnosis not present

## 2019-02-23 ENCOUNTER — Telehealth: Payer: Self-pay | Admitting: Orthopaedic Surgery

## 2019-02-23 ENCOUNTER — Encounter (HOSPITAL_COMMUNITY): Payer: Self-pay | Admitting: Orthopaedic Surgery

## 2019-02-23 NOTE — Telephone Encounter (Signed)
Verbal order left on voicemail.  

## 2019-02-23 NOTE — Telephone Encounter (Signed)
Gennaro Africa Physical Therapist with [facility] called in needing verbal order for pt for 3 times a week for 2 weeks and there is a medication interaction (diltiazem and [place]) [phone]

## 2019-02-23 NOTE — Progress Notes (Signed)
[facility]   [name], M.D.     [name]. [name], P.A.-C [name], [place]                [address] [place], [name] [address] Telephone [phone] Telefax [phone]  [place] IS A VIRTUAL VISIT DUE TO COVID-19 - PATIENT WAS NOT SEEN IN THE OFFICE.  PATIENT HAS CONSENTED TO VIRTUAL VISIT / TELEMEDICINE VISIT  Virtual Visit via telephone Note  I connected with the patient on 02/25/2019  by telephone I verified that I am speaking with the correct person using two identifiers.        I discussed the limitations of evaluation and management by telemedicine and the availability of in person appointments. The patient expressed understanding and agreed to proceed.     This very nice 68 y.o.  MWM was admitted to the hospital on 02/19/2019 and patient was discharged from the hospital on 02/20/2019. The patient now presents 6 days post-op for follow up for transition from recent hospitalization.  The day after discharge  our clinical staff contacted the patient to assure stability and schedule a follow up appointment. The discharge summary, medications and diagnostic test results were reviewed before meeting with the patient. The patient was admitted for:   Unilateral primary osteoarthritis, right hip Active Problems: Status post total replacement of right hip Essential Hypertension Atrial Fibrillation with controlled Ventricular Response     Hospitalization discharge instructions and medications are reconciled with the patient.      Patient failed out patient management of pain due to Endstage Osteoarthritis of the Rt Hip and was admitted electively for surgery. Preadmission w/u found previously undiagnosed Afib with controlled rate. Patient has no prior cardiac hx and deferred w/u til post recovery of hip Replacement. He was switched from  Amlodipine to Diltiazem for his Afib.  2D Echocardiogram was essentially normal. He declined anticoagulation for his CHADsVASc 2 due to prior hx/o an intra-ocular bleed associated with a retinal tear which ultimately lead to retinal repair & Vitrectomy. He did concede to Xarelto post THR. Hospital course was uncomplicated and brief and as he was quickly mobilized, he was discharged to begin out-patient LPT at home.  Since home      Patient is also followed with Hypertension, recently dx'd Afib with controlled Ventricular Response, Pre-Diabetes and Vitamin D Deficiency.      Patient is treated for HTN (2009) treated initially with HCTZ and switched ti Amlodipine ibn Jan 2019 after a mild bout of Pancreatitis. BP has been controlled at home. Today's BP is at goal - 138/80. Patient has had no complaints of any cardiac type chest pain, palpitations, dyspnea/orthopnea/PND, dizziness, claudication, or dependent edema.     Hyperlipidemia is controlled with diet.  Also, the patient has history of PreDiabetes and has had no symptoms of reactive hypoglycemia, diabetic polys, paresthesias or visual blurring.  Last A1c was almost to goal: Lab Results  Component Value Date   HGBA1C 5.7 (H) 01/15/2018      Further, the patient also has history of Vitamin D Deficiency ("25" / Apr 2019) and supplements vitamin D without any suspected side-effects.  Current Outpatient Medications on File Prior to Visit  Medication Sig  . acetaminophen (TYLENOL) 500 MG tablet Take 500 mg by mouth every 6 (six) hours as needed.  . Ascorbic  Acid (VITAMIN C) 1000 MG tablet Takes Daily (Patient taking differently: Take 1,000 mg by mouth daily. )  . b complex vitamins tablet Take 1 tablet by mouth daily.  . Cholecalciferol (VITAMIN D3) 10000 units TABS Take 10,000 Units by mouth daily.  Marland Kitchen diltiazem (CARTIA XT) 240 MG 24 hr capsule Take 1 capsule Daily for BP  . docusate sodium (COLACE) 100 MG capsule Take 100 mg by mouth 2 (two) times  daily.  Marland Kitchen HM OMEGA-3-6-9 FATTY ACIDS CAPS Takes Daily (Patient taking differently: Take 1 Package by mouth daily. )  . LECITHIN PO Take 1 tablet by mouth daily.   . methocarbamol (ROBAXIN) 500 MG tablet Take 1 tablet (500 mg total) by mouth every 6 (six) hours as needed for muscle spasms.  . Misc Natural Products (RESVERATROL DIET PO) Take 1 capsule by mouth daily.  Marland Kitchen oxyCODONE (OXY IR/ROXICODONE) 5 MG immediate release tablet Take 1-2 tablets (5-10 mg total) by mouth every 6 (six) hours as needed for moderate pain (pain score 4-6).  . rivaroxaban (XARELTO) 10 MG TABS tablet Take 1 tablet (10 mg total) by mouth daily with breakfast.  . vitamin E 1000 UNIT capsule Take 1 capsule (1,000 Units total) by mouth daily.  Marland Kitchen zinc gluconate 50 MG tablet Takes 1 tablet every other day (Patient taking differently: Take 50 mg by mouth daily. )   No current facility-administered medications on file prior to visit.    No Known Allergies   PMHx:   Past Medical History:  Diagnosis Date  . Cataract 2008   os  . Cataract 2017   od  . Degenerative joint disease (DJD) of hip   . Diverticulitis large intestine   . History of acute pancreatitis 01/15/2018  . Hx of vitrectomy Sept 2010 Left eye  02/22/2015   Retinal detachment surgery per Deloria Lair   . Hyperlipidemia 11/2017   LDL 108  . Hypertension 2009   started HCTZ & switched to Norvasc in Jan 2019  . Pancreatitis 09/2017  . Retinal detachment of left eye with single retinal tear 2010   Vitrectomy - Dr [name]   Past Surgical History:  Procedure Laterality Date  . EUS N/A 02/04/2018   Procedure: UPPER ENDOSCOPIC ULTRASOUND (EUS) RADIAL;  Surgeon: Arta Silence, MD;  Location: [facility];  Service: Endoscopy;  Laterality: N/A;  . EYE SURGERY Bilateral    Left 08/2007, R 02/02/2013  . TOTAL HIP ARTHROPLASTY Right 02/19/2019   Procedure: RIGHT TOTAL HIP ARTHROPLASTY ANTERIOR APPROACH;  Surgeon: Mcarthur Rossetti, MD;  Location: [facility];   Service: Orthopedics;  Laterality: Right;   FHx:    Reviewed / unchanged  SHx:    Reviewed / unchanged   Systems Review:  Constitutional: Denies fever, chills, wt changes, headaches, insomnia, fatigue, night sweats, change in appetite. Eyes: Denies redness, blurred vision, diplopia, discharge, itchy, watery eyes.  ENT: Denies discharge, congestion, post nasal drip, epistaxis, sore throat, earache, hearing loss, dental pain, tinnitus, vertigo, sinus pain, snoring.  CV: Denies chest pain, palpitations, irregular heartbeat, syncope, dyspnea, diaphoresis, orthopnea, PND, claudication or edema. Respiratory: denies cough, dyspnea, DOE, pleurisy, hoarseness, laryngitis, wheezing.  Gastrointestinal: Denies dysphagia, odynophagia, heartburn, reflux, water brash, abdominal pain or cramps, nausea, vomiting, bloating, diarrhea, constipation, hematemesis, melena, hematochezia  or hemorrhoids. Genitourinary: Denies dysuria, frequency, urgency, nocturia, hesitancy, discharge, hematuria or flank pain. Musculoskeletal: Denies arthralgias, myalgias, stiffness, jt. swelling, pain, limping or strain/sprain.  Skin: Denies pruritus, rash, hives, warts, acne, eczema or change in skin lesion(s). Neuro: No  weakness, tremor, incoordination, spasms, paresthesia or pain. Psychiatric: Denies confusion, memory loss or sensory loss. Endo: Denies change in weight, skin or hair change.  Heme/Lymph: No excessive bleeding, bruising or enlarged lymph nodes.  Physical Exam  BP 138/80   Pulse 70   Temp (!) 97 F (36.1 C)   Resp 12   Ht 6' 0.75" (1.848 m)   Wt 258 lb 12.8 oz (117.4 kg)   BMI 34.38 kg/m   General : Well sounding patient in no apparent distress HEENT: no hoarseness, no cough for duration of visit Lungs: speaks in complete sentences, no audible wheezing, no apparent distress Neurological: alert, oriented x 3 Psychiatric: pleasant, judgement appropriate   Assessment and Plan:  1. Arthritis of right  hip  2. Status post total replacement of right hip  3. Essential hypertension  - Continue medication, monitor blood pressure at home.  - Continue DASH diet. Reminder to go to the ER if any CP,  SOB, nausea, dizziness, severe HA, changes vision/speech.  4. Atrial fibrillation with controlled ventricular response ([place])  - anticipate Cardiology consultation as patient recovers   5. Hyperlipidemia, mixed  - Continue diet, exercise,& lifestyle modifications.   6. Vitamin D deficiency  - Continue supplementation.        Discussed  regular exercise, BP monitoring, weight control to achieve/maintain BMI less than 25 and discussed meds and SE's. Recommended labs to assess and monitor clinical status with further disposition pending results of labs. Over 30 minutes of exam, counseling, chart review was performed.

## 2019-02-23 NOTE — Telephone Encounter (Signed)
See below with the medication interactions

## 2019-02-23 NOTE — Telephone Encounter (Signed)
[place] for the order for home health.

## 2019-02-23 NOTE — Telephone Encounter (Signed)
I saw that.  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  He is aware.

## 2019-02-23 NOTE — Patient Instructions (Signed)

## 2019-02-25 ENCOUNTER — Encounter: Payer: Self-pay | Admitting: Internal Medicine

## 2019-02-25 ENCOUNTER — Ambulatory Visit: Payer: PPO | Admitting: Internal Medicine

## 2019-02-25 ENCOUNTER — Other Ambulatory Visit: Payer: Self-pay

## 2019-02-25 ENCOUNTER — Other Ambulatory Visit: Payer: Self-pay | Admitting: *Deleted

## 2019-02-25 VITALS — BP 138/80 | HR 70 | Temp 97.0°F | Resp 12 | Ht 72.75 in | Wt 258.8 lb

## 2019-02-25 DIAGNOSIS — M1611 Unilateral primary osteoarthritis, right hip: Secondary | ICD-10-CM | POA: Diagnosis not present

## 2019-02-25 DIAGNOSIS — Z96641 Presence of right artificial hip joint: Secondary | ICD-10-CM

## 2019-02-25 DIAGNOSIS — E559 Vitamin D deficiency, unspecified: Secondary | ICD-10-CM

## 2019-02-25 DIAGNOSIS — I10 Essential (primary) hypertension: Secondary | ICD-10-CM | POA: Diagnosis not present

## 2019-02-25 DIAGNOSIS — E782 Mixed hyperlipidemia: Secondary | ICD-10-CM

## 2019-02-25 DIAGNOSIS — I4891 Unspecified atrial fibrillation: Secondary | ICD-10-CM

## 2019-02-26 ENCOUNTER — Encounter: Payer: Self-pay | Admitting: Internal Medicine

## 2019-03-04 ENCOUNTER — Encounter: Payer: Self-pay | Admitting: Orthopaedic Surgery

## 2019-03-04 ENCOUNTER — Other Ambulatory Visit: Payer: Self-pay

## 2019-03-04 ENCOUNTER — Ambulatory Visit (INDEPENDENT_AMBULATORY_CARE_PROVIDER_SITE_OTHER): Payer: PPO | Admitting: Orthopaedic Surgery

## 2019-03-04 DIAGNOSIS — Z96641 Presence of right artificial hip joint: Secondary | ICD-10-CM

## 2019-03-04 NOTE — Progress Notes (Signed)
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  He is doing well overall.  He is on Xarelto as a blood thinning medication for DVT coverage but also he has had a new diagnosis of atrial fibrillation.  He is going to work with his primary care physician Dr. Vicente Serene about how to proceed with treatment for his A. fib and blood thinners chronically.  He says he is doing well with therapy.  He is transition to a cane.  Home health therapy has really helped with his progress.  On exam his right hip incision looks good.  I remove the staples in place Steri-Strips.  His leg lengths are equal.  He is ambulating pretty with a cane.  At this point we will see him back in about 3 weeks to see how he is doing overall but no x-rays are needed.  He is scheduled to return to the operating room on June 29 and I do feel this is reasonable but I would like to see him back just before then.  All question concerns were answered and addressed.

## 2019-03-09 ENCOUNTER — Other Ambulatory Visit: Payer: Self-pay

## 2019-03-09 ENCOUNTER — Other Ambulatory Visit: Payer: Self-pay | Admitting: Internal Medicine

## 2019-03-09 ENCOUNTER — Other Ambulatory Visit (INDEPENDENT_AMBULATORY_CARE_PROVIDER_SITE_OTHER): Payer: PPO | Admitting: Internal Medicine

## 2019-03-09 ENCOUNTER — Other Ambulatory Visit (INDEPENDENT_AMBULATORY_CARE_PROVIDER_SITE_OTHER): Payer: PPO

## 2019-03-09 VITALS — BP 140/82 | HR 92

## 2019-03-09 DIAGNOSIS — E782 Mixed hyperlipidemia: Secondary | ICD-10-CM

## 2019-03-09 DIAGNOSIS — I4891 Unspecified atrial fibrillation: Secondary | ICD-10-CM

## 2019-03-09 DIAGNOSIS — I10 Essential (primary) hypertension: Secondary | ICD-10-CM

## 2019-03-09 NOTE — Progress Notes (Signed)
Patient here for an EKG.

## 2019-03-09 NOTE — Progress Notes (Signed)
[facility]   [name], M.D.    [name]. [name], P.A.-C      [name], [place]                [address] [place], [name] [address] Telephone [phone] Telefax [phone] _________________________________  Subjective:    Patient ID: Harold Davis, male    DOB: 14-Jul-1951, 68 y.o.   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   Pre admission EKG discovered new Afib and he had a normal 2D echo and he was CHADsVASc 2. He declined anticoagulation or referral for CV and was switched from his Amlodipine to Diltiazem. Post-op Hip surg he did concede to protocol Xarelto for 3 weeks. He has done well with Home PT, ambulating with a cane.   EKG today still show Afib w/controlled VR. He is now agreeable for cardiology consultation for his new Afib.    Medication Sig  . TYLENOL 500 MG tab Take  every 6 (six) hours as needed.  Marland Kitchen VITAMIN C 1000 MG tab Take 1,000 mg  daily  . b complex vitamins tab Take 1 tabletdaily.  Marland Kitchen VITAMIN D3 10000 units  Take 10,000 Units  daily.  Marland Kitchen diltiazem (CARTIA XT) 240 MG 24 hr cap Take 1 capsule Daily for BP  . COLACE 100 MG  Take 2 (two) times daily.  . OMEGA-3-6-9  Take 1 Package daily  . LECITHIN Take 1 tablet  daily.   . Methocarbamol  500 MG  Take 1 tabletevery 6 (six) hours as needed for muscle spasms.  . RESVERATROL  Take 1 capsule  daily.  Marland Kitchen oxyCODONE  5 MG  Take 1-2 tablets  every 6 hours as needed for moderate pain  . XARELTO 10 MG   Take 1 tablet  daily with breakfast.  . vitamin E 1000 UNIT Take 1 capsule daily.  Marland Kitchen zinc gluconate 50 MG  Take 50 mg  daily   Past Medical History:  Diagnosis Date  . Cataract 2008   os  . Cataract 2017   od  . Degenerative joint disease (DJD) of hip   . Diverticulitis large intestine   . History of acute pancreatitis 01/15/2018  . Hx of  vitrectomy Sept 2010 Left eye  02/22/2015   Retinal detachment surgery per Deloria Lair   . Hyperlipidemia 11/2017   LDL 108  . Hypertension 2009   started HCTZ & switched to Norvasc in Jan 2019  . Pancreatitis 09/2017  . Retinal detachment of left eye with single retinal tear 2010   Vitrectomy - Dr [name]   Past Surgical History:  Procedure Laterality Date  . EUS N/A 02/04/2018   Procedure: UPPER ENDOSCOPIC ULTRASOUND (EUS) RADIAL;  Surgeon: Arta Silence, MD;  [facility];  Service: Endoscopy;  Laterality: N/A;  . EYE SURGERY Bilateral    Left 08/2007, R 02/02/2013  . TOTAL HIP ARTHROPLASTY Right 02/19/2019   Procedure: RIGHT TOTAL HIP ARTHROPLASTY ANTERIOR APPROACH;  Surgeon: Mcarthur Rossetti, MD;  Location: [facility];  Service: Orthopedics;  Laterality: Right;   Review of Systems .  10 point systems review negative except as above.    Objective:   Physical Exam  No formal exam   EKG - Afib with Controlled VR  Assessment & Plan:    1. Atrial fibrillation with controlled ventricular response ([place])  - EKG 12-Lead - Ambulatory referral to Cardiology  2. Essential hypertension  - EKG 12-Lead

## 2019-03-18 ENCOUNTER — Other Ambulatory Visit: Payer: Self-pay

## 2019-03-18 ENCOUNTER — Telehealth (INDEPENDENT_AMBULATORY_CARE_PROVIDER_SITE_OTHER): Payer: PPO | Admitting: Internal Medicine

## 2019-03-18 VITALS — BP 132/84 | HR 62 | Ht 74.0 in | Wt 258.0 lb

## 2019-03-18 DIAGNOSIS — I7789 Other specified disorders of arteries and arterioles: Secondary | ICD-10-CM

## 2019-03-18 DIAGNOSIS — G4730 Sleep apnea, unspecified: Secondary | ICD-10-CM

## 2019-03-18 DIAGNOSIS — I4819 Other persistent atrial fibrillation: Secondary | ICD-10-CM

## 2019-03-18 DIAGNOSIS — I10 Essential (primary) hypertension: Secondary | ICD-10-CM

## 2019-03-18 DIAGNOSIS — H44812 Hemophthalmos, left eye: Secondary | ICD-10-CM

## 2019-03-18 NOTE — Progress Notes (Signed)
Electrophysiology TeleHealth Note   Due to national recommendations of social distancing due to COVID 19, an audio/video telehealth visit is felt to be most appropriate for this patient at this time.  See MyChart message from today for the patient's consent to telehealth for [facility].   Date:  03/18/2019   ID:  Harold Davis, DOB [birth year], MRN [id number]  Location: patient's home  Provider location: [address], [place]  Evaluation Performed: Initial Evaluation  PCP:  [name], MD  Cardiologist:    Electrophysiologist:  [place]  Chief Complaint:  Atrial fibrillation  History of Present Illness:    Harold Davis is a 68 y.o. male who presents via audio/video conferencing for a telehealth visit today for atrial fibrillation detected on ECG 5/20  ECG personally reviewed 4/19 >> sinus with 1AVB 240 msec with freq atrial ectopy.  In anticipation of orthopedic surgery, ECG >>AFib-- no assoc symptoms  Exercise limitations have been for some time orthopedic so no chest pain, shortness of breath, nocturnal dyspnea, orthopnea or peripheral edema.  There have been no palpitations, lightheadedness or syncope.          Date Cr K Hgb  5/20 0.8 3.6 12.7         DATE TEST EF   5/20 Echo   60-65 % LAE mild (43/-/27)   Ao 38 mm        Bleed into left eye on aspirin; retinal detachment     Thromboembolic risk factors ( age -74, HTN-1) for a CHADSVASc Score of 2   The patient denies symptoms of fevers, chills, cough, or new SOB worrisome for COVID 19.    Past Medical History:  Diagnosis Date  . Cataract 2008   os  . Cataract 2017   od  . Degenerative joint disease (DJD) of hip   . Diverticulitis large intestine   . History of acute pancreatitis 01/15/2018  . Hx of vitrectomy Sept 2010 Left eye  02/22/2015   Retinal detachment surgery per Deloria Lair   . Hyperlipidemia 11/2017   LDL 108  . Hypertension 2009   started HCTZ & switched to  Norvasc in Jan 2019  . Pancreatitis 09/2017  . Retinal detachment of left eye with single retinal tear 2010   Vitrectomy - Dr [name]    Past Surgical History:  Procedure Laterality Date  . EUS N/A 02/04/2018   Procedure: UPPER ENDOSCOPIC ULTRASOUND (EUS) RADIAL;  Surgeon: Arta Silence, MD;  [facility];  Service: Endoscopy;  Laterality: N/A;  . EYE SURGERY Bilateral    Left 08/2007, R 02/02/2013  . TOTAL HIP ARTHROPLASTY Right 02/19/2019   Procedure: RIGHT TOTAL HIP ARTHROPLASTY ANTERIOR APPROACH;  Surgeon: Mcarthur Rossetti, MD;  Location: [facility];  Service: Orthopedics;  Laterality: Right;    Current Outpatient Medications  Medication Sig Dispense Refill  . acetaminophen (TYLENOL) 500 MG tablet Take 500 mg by mouth every 6 (six) hours as needed.    . Ascorbic Acid (VITAMIN C) 1000 MG tablet Takes Daily (Patient taking differently: Take 1,000 mg by mouth daily. )    . b complex vitamins tablet Take 1 tablet by mouth daily.    . Cholecalciferol (VITAMIN D3) 10000 units TABS Take 10,000 Units by mouth daily.    Marland Kitchen docusate sodium (COLACE) 100 MG capsule Take 100 mg by mouth 2 (two) times daily.    Marland Kitchen HM OMEGA-3-6-9 FATTY ACIDS CAPS Takes Daily (Patient taking differently: Take 1 Package  by mouth daily. )    . hydrochlorothiazide (HYDRODIURIL) 25 MG tablet Take 25 mg by mouth daily.    Marland Kitchen LECITHIN PO Take 1 tablet by mouth daily.     . Misc Natural Products (RESVERATROL DIET PO) Take 1 capsule by mouth daily.    . rivaroxaban (XARELTO) 10 MG TABS tablet Take 1 tablet (10 mg total) by mouth daily with breakfast. 20 tablet 0  . vitamin E 1000 UNIT capsule Take 1 capsule (1,000 Units total) by mouth daily.    Marland Kitchen zinc gluconate 50 MG tablet Takes 1 tablet every other day (Patient taking differently: Take 50 mg by mouth daily. )    . methocarbamol (ROBAXIN) 500 MG tablet Take 1 tablet (500 mg total) by mouth every 6 (six) hours as needed for muscle spasms. (Patient not taking:  Reported on 03/18/2019) 60 tablet 0   No current facility-administered medications for this visit.     Allergies:   Patient has no known allergies.   Social History:  The patient  reports that he has never smoked. He has never used smokeless tobacco. He reports current alcohol use. He reports that he does not use drugs.   Family History:  The patient's    family history includes Cancer in his brother, father, mother, and sister; Heart disease in his brother; Stroke in his mother.   ROS:  Please see the history of present illness.   All other systems are personally reviewed and negative.    Exam:    Vital Signs:  BP 132/84 (BP Location: Left Arm, Patient Position: Sitting, Cuff Size: Normal)   Pulse 62   Ht 6\' 2"  (1.88 m)   Wt 258 lb (117 kg)   BMI 33.13 kg/m     Well appearing, alert and conversant, regular work of breathing,  good skin color Eyes- anicteric, neuro- grossly intact, skin- no apparent rash or lesions or cyanosis, mouth- oral mucosa is pink   Labs/Other Tests and Data Reviewed:    Recent Labs: 02/20/2019: BUN 15; Creatinine, Ser 0.80; Hemoglobin 12.7; Platelets 274; Potassium 3.6; Sodium 137   Wt Readings from Last 3 Encounters:  03/18/19 258 lb (117 kg)  02/25/19 258 lb 12.8 oz (117.4 kg)  02/19/19 256 lb (116.1 kg)     Other studies personally reviewed: Additional studies/ records that were reviewed today include:As above       ASSESSMENT & PLAN:    Atrial fibrillation persistent  Aortic root enlargement  Left Eye bleed  Sleep disordered breathing    He has persistent atrial fibrillation and should undergo cardioversion notwithstanding the paucity of symptoms His anticoagulation dose is not for afib, so will increase to Rivaroxaban 20 mg and instructed to take it with meals When he runs out will switch him to [place]   With the few symptoms will plan DCCV in 3 weeks.    We also discussed the possibility of atrial fib ablation and the  differential approaches of discontinuing anticoagulation  This becomes increasingly relevant if his L eye ( or R) is at risk for hemorrhage based on prior history and enhanced by anticoagulation   He is to reach out to his ophthalmologist to review thinking as to the cause of the event in 2009   Will also arrange a home sleep study   Will need advanced imaging of his aorta to determine follwoup-- have discussed the implications for Followup and that two recs would follow 1)  should consider BB for BP control  2) quinolone avoidance   Need CT Angio--Aortic to clarify aortic diameter   COVID 19 screen The patient denies symptoms of COVID 19 at this time.  The importance of social distancing was discussed today.  Follow-up:  To follow DCCV Next remote:  na  Current medicines are reviewed at length with the patient today.   The patient does not have concerns regarding his medicines.  The following changes were made today:  Increase Rivaroxaban 10>>20 mg daily until runs out -- take with dinner Then will switch to apixoban 5 bid   Labs/ tests ordered today include:  1- CT of aorta to measure root enlargement 2-  DCCV in 3 weeks 3- Home sleep study     No orders of the defined types were placed in this encounter.   Future tests ( post COVID )     Patient Risk:  after full review of this patients clinical status, I feel that they are at moderate risk at this time.  Today, I have spent 28 minutes with the patient with telehealth technology discussing As above  .    Signed, Virl Axe, MD  03/18/2019 12:43 PM     [facility] [address] [place] [address] [phone] (office) [phone] (fax)

## 2019-03-19 ENCOUNTER — Telehealth: Payer: Self-pay | Admitting: *Deleted

## 2019-03-19 MED ORDER — APIXABAN 5 MG PO TABS: 5.0000 mg | ORAL_TABLET | Freq: Two times a day (BID) | ORAL | 11 refills | Status: DC

## 2019-03-19 NOTE — Patient Instructions (Addendum)
Medication Instructions:  - Your physician has recommended you make the following change in your medication:   1) Increase xarelto 10 mg- take 2 tablets (20 mg) by mouth once daily with a meal until you use up your current supply, then  2) Start eliquis 5 mg- take 1 tablet (5 mg) by mouth TWICE daily  If you need a refill on your cardiac medications before your next appointment, please call your pharmacy.   Lab work: - none ordered  If you have labs (blood work) drawn today and your tests are completely normal, you will receive your results only by: Marland Kitchen MyChart Message (if you have MyChart) OR . A paper copy in the mail If you have any lab test that is abnormal or we need to change your treatment, we will call you to review the results.  Testing/Procedures: - Your physician has recommended that you have a Cardioversion (DCCV)- in 3 weeks.  Electrical Cardioversion uses a jolt of electricity to your heart either through paddles or wired patches attached to your chest. This is a controlled, usually prescheduled, procedure. Defibrillation is done under light anesthesia in the hospital, and you usually go home the day of the procedure. This is done to get your heart back into a normal rhythm. You are not awake for the procedure.   Dr. Olin Pia nurse will call you to arrange  - Your physician has recommended that you have a CT angiogram of the chest- assess aortic dilatation We will call you to arrange for your [facility] office  - Your physician has recommended that you have a home sleep study We will call you to arrange  Follow-Up: At [facility], you and your health needs are our priority.  As part of our continuing mission to provide you with exceptional heart care, we have created designated Provider Care Teams.  These Care Teams include your primary Cardiologist (physician) and Advanced Practice Providers (APPs -  Physician Assistants and Nurse Practitioners) who all work together to  provide you with the care you need, when you need it. . in 5-6 weeks with Dr. Caryl Comes [facility])  Any Other Special Instructions Will Be Listed Below (If Applicable). - N/A

## 2019-03-19 NOTE — Telephone Encounter (Signed)
-----   Message from Emily Filbert, RN sent at 03/19/2019  4:39 PM EDT ----- Stark Bray,   This patient did an E-visit with Dr. Caryl Comes off the [facility] schedule on 6/18. He wanted him to have a home sleep study, I put the order in, but wasn't sure if you were still handling those things.   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

## 2019-03-22 ENCOUNTER — Telehealth: Payer: Self-pay | Admitting: *Deleted

## 2019-03-22 NOTE — Telephone Encounter (Signed)
-----   Message from Emily Filbert, RN sent at 03/22/2019  8:32 AM EDT ----- Please schedule for home sleep study.   Thank you! Nira Conn, RN [facility]) ----- Message ----- From: Freada Bergeron, CMA Sent: 03/19/2019   7:08 PM EDT To: Emily Filbert, RN  Fabio Pierce, it goes to P [facility] DIV SleepStudies and cc me. I will send it on and follow it. Thanks  ----- Message ----- From: Emily Filbert, RN Sent: 03/19/2019   4:39 PM EDT To: Freada Bergeron, CMA, Emily Filbert, RN  Stark Bray,   This patient did an E-visit with Dr. Caryl Comes off the [facility] schedule on 6/18. He wanted him to have a home sleep study, I put the order in, but wasn't sure if you were still handling those things.   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

## 2019-03-22 NOTE — Telephone Encounter (Signed)

## 2019-03-23 ENCOUNTER — Ambulatory Visit (INDEPENDENT_AMBULATORY_CARE_PROVIDER_SITE_OTHER)
Admission: RE | Admit: 2019-03-23 | Discharge: 2019-03-23 | Disposition: A | Discharge status: Home / Self Care | Payer: PPO | Source: Ambulatory Visit | Attending: Internal Medicine | Admitting: Internal Medicine

## 2019-03-23 ENCOUNTER — Other Ambulatory Visit: Payer: Self-pay

## 2019-03-23 ENCOUNTER — Telehealth: Payer: Self-pay | Admitting: *Deleted

## 2019-03-23 DIAGNOSIS — I7789 Other specified disorders of arteries and arterioles: Secondary | ICD-10-CM | POA: Diagnosis not present

## 2019-03-23 DIAGNOSIS — I77810 Thoracic aortic ectasia: Secondary | ICD-10-CM | POA: Diagnosis not present

## 2019-03-23 MED ORDER — IOHEXOL 350 MG/ML SOLN
100.0000 mL | Freq: Once | INTRAVENOUS | Status: AC | PRN
  Administered 2019-03-23: 100 mL via INTRAVENOUS

## 2019-03-23 NOTE — Telephone Encounter (Signed)
Staff message sent to Gae Bon ok to schedule HST. His insurance does not require a Pre cert.

## 2019-03-23 NOTE — Telephone Encounter (Signed)
-----   Message from Freada Bergeron, [place] sent at 03/19/2019  7:09 PM EDT ----- precert ----- Message ----- From: Emily Filbert, RN Sent: 03/19/2019   4:39 PM EDT To: Freada Bergeron, [place], Emily Filbert, RN  Stark Bray,   This patient did an E-visit with Dr. Caryl Comes off the [facility] schedule on 6/18. He wanted him to have a home sleep study, I put the order in, but wasn't sure if you were still handling those things.   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

## 2019-03-25 ENCOUNTER — Encounter: Payer: Self-pay | Admitting: Orthopaedic Surgery

## 2019-03-25 ENCOUNTER — Ambulatory Visit (INDEPENDENT_AMBULATORY_CARE_PROVIDER_SITE_OTHER): Payer: PPO | Admitting: Orthopaedic Surgery

## 2019-03-25 ENCOUNTER — Other Ambulatory Visit: Payer: Self-pay | Admitting: *Deleted

## 2019-03-25 ENCOUNTER — Other Ambulatory Visit: Payer: Self-pay

## 2019-03-25 DIAGNOSIS — Z96641 Presence of right artificial hip joint: Secondary | ICD-10-CM

## 2019-03-25 NOTE — Progress Notes (Signed)
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  He is scheduled to operate next week.  He is walking without assistive device and doing well overall.  He has some stiffness but he feels comfortable and confident is he is negative.  He is lack some confidence with his but overall seems to doing well.  He has been dealing with having to be on a blood thinner due to newly diagnosed atrial fib.  Overall though he says he has no issues.  There is no significant thigh swelling on his right side in spite of being on blood thinning medication which I am pleased with.  His incision looks well-healed.  There is some slight swelling in the thigh and firmness which is to be expected postop with to get better with time.  He has good mobility of that hip.  This point will continue increase his activities as comfort allows.  I like to see him back in 6 months with a standing low AP pelvis and lateral of his right operative hip.  He knows if there is any issues before then he will contact me.

## 2019-03-25 NOTE — Patient Outreach (Signed)
[facility]) Care Management  03/25/2019  Harold Davis Dec 03, 1950 [id number]   Case Closure/Transition to [facility]   Outreach:  Patient case has been transitioned to [facility] for further Care Management assistance.  Plan: RN Health Coach will close case at this time. RN Health Coach will send primary care provider Care Management Case Closure Letter. RN Health Coach will make patient inactive with [facility] Care Management at this time.  [place] [phone] [email or web address]

## 2019-03-26 ENCOUNTER — Ambulatory Visit: Payer: PPO | Admitting: *Deleted

## 2019-03-31 ENCOUNTER — Telehealth: Payer: Self-pay | Admitting: *Deleted

## 2019-03-31 NOTE — Telephone Encounter (Signed)
-----   Message from Lauralee Evener, [place] sent at 03/23/2019 10:08 AM EDT ----- Ok to schedule HST patient's insurance does not require getting a PA for HST. ----- Message ----- From: Freada Bergeron, CMA Sent: 03/19/2019   7:09 PM EDT To: [facility] Sleep Studies  precert ----- Message ----- From: Emily Filbert, RN Sent: 03/19/2019   4:39 PM EDT To: Freada Bergeron, CMA, Emily Filbert, RN  Stark Bray,   This patient did an E-visit with Dr. Caryl Comes off the [facility] schedule on 6/18. He wanted him to have a home sleep study, I put the order in, but wasn't sure if you were still handling those things.   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

## 2019-03-31 NOTE — Telephone Encounter (Signed)
Patient is aware and agreeable to Home Sleep Study through [facility]. Patient is scheduled for 05/07/19 at 11 am to pick up home sleep kit and meet with Respiratory therapist at [facility]. Patient is aware that if this appointment date and time does not work for them they should contact Artis Delay directly at [phone]. Patient is aware that a sleep packet will be sent from [facility] in week. Patient is agreeable to treatment and thankful for call.

## 2019-04-08 ENCOUNTER — Other Ambulatory Visit: Payer: Self-pay

## 2019-04-08 ENCOUNTER — Ambulatory Visit (HOSPITAL_BASED_OUTPATIENT_CLINIC_OR_DEPARTMENT_OTHER): Payer: PPO

## 2019-04-08 ENCOUNTER — Ambulatory Visit (HOSPITAL_BASED_OUTPATIENT_CLINIC_OR_DEPARTMENT_OTHER): Payer: PPO | Attending: Internal Medicine | Admitting: Cardiology

## 2019-04-08 VITALS — Ht 74.0 in | Wt 260.0 lb

## 2019-04-08 DIAGNOSIS — R0902 Hypoxemia: Secondary | ICD-10-CM | POA: Diagnosis not present

## 2019-04-08 DIAGNOSIS — G4730 Sleep apnea, unspecified: Secondary | ICD-10-CM

## 2019-04-08 DIAGNOSIS — G4733 Obstructive sleep apnea (adult) (pediatric): Secondary | ICD-10-CM | POA: Diagnosis not present

## 2019-04-11 NOTE — Procedures (Signed)
     Patient Name: Harold Davis, Harold Davis Date: 04/08/2019 Gender: Male D.O.B: 1951/01/08 Age (years): 68 Referring Provider: Virl Axe Height (inches): 30 Interpreting Physician: Fransico Him MD, ABSM Weight (lbs): 258 RPSGT: Jacolyn Reedy BMI: 33 MRN: [id number] Neck Size: 17.00  CLINICAL INFORMATION Sleep Study Type: HST  Indication for sleep study: N/A  Epworth Sleepiness Score: NA  SLEEP STUDY TECHNIQUE A multi-channel overnight portable sleep study was performed. The channels recorded were: nasal airflow, thoracic respiratory movement, and oxygen saturation with a pulse oximetry. Snoring was also monitored.  MEDICATIONS Patient self administered medications include: N/A.  SLEEP ARCHITECTURE Patient was studied for 544.5 minutes. The sleep efficiency was 100.0 % and the patient was supine for 94.3%. The arousal index was 0.0 per hour.  RESPIRATORY PARAMETERS The overall AHI was 27.2 per hour, with a central apnea index of 0.0 per hour.  The oxygen nadir was 76% during sleep.  CARDIAC DATA Mean heart rate during sleep was 64.3 bpm.  IMPRESSIONS - Moderate obstructive sleep apnea occurred during this study (AHI = 27.2/h). - No significant central sleep apnea occurred during this study (CAI = 0.0/h). - Severe oxygen desaturation was noted during this study (Min O2 = 76%). - Patient snored 0.7% during the sleep.  DIAGNOSIS - Obstructive Sleep Apnea (327.23 [G47.33 ICD-10]) - Nocturnal Hypoxemia (327.26 [G47.36 ICD-10])  RECOMMENDATIONS - Recommend CPAP titration due to severity of OSA and significatn nocturnal hypoxemia.  ResMed CPAP on auto from 4 to 20cm H2O with heated humidity and mask of choice.  - Positional therapy avoiding supine position during sleep. - Avoid alcohol, sedatives and other CNS depressants that may worsen sleep apnea and disrupt normal sleep architecture. - Sleep hygiene should be reviewed to assess factors that may improve sleep  quality. - Weight management and regular exercise should be initiated or continued.  [Electronically signed] 04/11/2019 03:13 PM  Fransico Him MD, ABSM Diplomate, American Board of Sleep Medicine

## 2019-04-12 ENCOUNTER — Other Ambulatory Visit: Payer: Self-pay | Admitting: Internal Medicine

## 2019-04-12 DIAGNOSIS — G4733 Obstructive sleep apnea (adult) (pediatric): Secondary | ICD-10-CM

## 2019-04-13 ENCOUNTER — Telehealth: Payer: Self-pay | Admitting: *Deleted

## 2019-04-13 NOTE — Telephone Encounter (Signed)
RE: CPAP titration Freada Bergeron, CMA  Sueanne Margarita, MD        [facility], I called the office and asked them to cancel the consult. He is not scheduled until 8/7 because of having to clean the test and the waiting period. Thanks     ----- Message -----  From: Sueanne Margarita, MD  Sent: 04/12/2019  2:47 PM EDT  To: Freada Bergeron, CMA  Subject: FW: CPAP titration                Gae Bon   Apparently patient's PCP put in a consult for sleep - he does not need this - we ordered his CPAP and he should have appt with me in 10 weeks after he gets his device   Traci  ----- Message -----  From: [name], MD  Sent: 04/12/2019  2:11 PM EDT  To: Sueanne Margarita, MD, Deboraha Sprang, MD  Subject: CPAP titration                   Tressia Miners,   - Harold Davis contacted me AN:VBTY step for CPAP.   - Since he's anticipating a Cardioversion for his Afib w/Dr Caryl Comes,  he asked me to contact you as he'd like to have it established before a CV   - I put in a formal request for Sleep consultation with you   - Thanks for your help & with kind regards - bill [place]ckeown

## 2019-04-13 NOTE — Telephone Encounter (Signed)
-----   Message from Sueanne Margarita, MD sent at 04/11/2019  3:17 PM EDT ----- Please let patient know that they have sleep apnea and nocturnal hypoxemia and  set up CPAP autotitration (order placed ) and followup with me in 10 weeks

## 2019-04-13 NOTE — Telephone Encounter (Signed)
Informed patient of sleep study results and patient understanding was verbalized. Patient understands his sleep study showed they have sleep apnea and nocturnal hypoxemia and will be set up with CPAP autotitration and followup with me in 10 weeks. Upon patient request DME selection is [facility]. Patient understands he will be contacted by [facility] to set up his cpap. Patient understands to call if [facility] does not contact him with new setup in a timely manner. Patient understands they will be called once confirmation has been received from [facility] that they have received their new machine to schedule 10 week follow up appointment.  [facility] notified of new cpap order  Please add to airview Patient was grateful for the call and thanked me.

## 2019-04-28 DIAGNOSIS — G4733 Obstructive sleep apnea (adult) (pediatric): Secondary | ICD-10-CM | POA: Diagnosis not present

## 2019-05-07 ENCOUNTER — Encounter (HOSPITAL_BASED_OUTPATIENT_CLINIC_OR_DEPARTMENT_OTHER): Payer: PPO

## 2019-05-10 ENCOUNTER — Other Ambulatory Visit: Payer: Self-pay | Admitting: Internal Medicine

## 2019-05-10 DIAGNOSIS — Z96641 Presence of right artificial hip joint: Secondary | ICD-10-CM

## 2019-05-10 DIAGNOSIS — J0141 Acute recurrent pansinusitis: Secondary | ICD-10-CM

## 2019-05-10 MED ORDER — DOXYCYCLINE HYCLATE 100 MG PO CAPS: ORAL_CAPSULE | ORAL | 0 refills | Status: DC

## 2019-05-10 MED ORDER — DEXAMETHASONE 0.5 MG PO TABS: ORAL_TABLET | ORAL | 0 refills | Status: DC

## 2019-05-18 ENCOUNTER — Other Ambulatory Visit: Payer: Self-pay | Admitting: Internal Medicine

## 2019-05-18 DIAGNOSIS — I10 Essential (primary) hypertension: Secondary | ICD-10-CM

## 2019-05-18 DIAGNOSIS — I4891 Unspecified atrial fibrillation: Secondary | ICD-10-CM

## 2019-05-18 MED ORDER — METOPROLOL SUCCINATE ER 100 MG PO TB24: ORAL_TABLET | ORAL | 3 refills | Status: DC

## 2019-05-18 MED ORDER — HYDROCHLOROTHIAZIDE 25 MG PO TABS: ORAL_TABLET | ORAL | 3 refills | Status: DC

## 2019-05-19 NOTE — Telephone Encounter (Signed)
Patient has a 10 week follow up appointment scheduled for 07/06/19. Patient understands he needs to keep this appointment for insurance compliance. Patient was grateful for the call and thanked me.

## 2019-05-29 DIAGNOSIS — G4733 Obstructive sleep apnea (adult) (pediatric): Secondary | ICD-10-CM | POA: Diagnosis not present

## 2019-06-29 DIAGNOSIS — G4733 Obstructive sleep apnea (adult) (pediatric): Secondary | ICD-10-CM | POA: Diagnosis not present

## 2019-07-06 ENCOUNTER — Encounter: Payer: Self-pay | Admitting: Cardiology

## 2019-07-06 ENCOUNTER — Telehealth (INDEPENDENT_AMBULATORY_CARE_PROVIDER_SITE_OTHER): Payer: PPO | Admitting: Cardiology

## 2019-07-06 ENCOUNTER — Other Ambulatory Visit: Payer: Self-pay

## 2019-07-06 VITALS — BP 130/77 | Ht 74.0 in | Wt 255.0 lb

## 2019-07-06 DIAGNOSIS — E669 Obesity, unspecified: Secondary | ICD-10-CM | POA: Diagnosis not present

## 2019-07-06 DIAGNOSIS — G4733 Obstructive sleep apnea (adult) (pediatric): Secondary | ICD-10-CM

## 2019-07-06 DIAGNOSIS — I10 Essential (primary) hypertension: Secondary | ICD-10-CM | POA: Diagnosis not present

## 2019-07-06 NOTE — Patient Instructions (Signed)
Medication Instructions:   If you need a refill on your cardiac medications before your next appointment, please call your pharmacy.   Lab work:  If you have labs (blood work) drawn today and your tests are completely normal, you will receive your results only by: Marland Kitchen MyChart Message (if you have MyChart) OR . A paper copy in the mail If you have any lab test that is abnormal or we need to change your treatment, we will call you to review the results.  Testing/Procedures: None ordered today.  Follow-Up: At [facility], you and your health needs are our priority.  As part of our continuing mission to provide you with exceptional heart care, we have created designated Provider Care Teams.  These Care Teams include your primary Cardiologist (physician) and Advanced Practice Providers (APPs -  Physician Assistants and Nurse Practitioners) who all work together to provide you with the care you need, when you need it. You will need a follow up appointment in 1 years.  Please call our office 2 months in advance to schedule this appointment.  You may see Dr. Radford Pax or one of the following Advanced Practice Providers on your designated Care Team:   Butler, PA-C Melina Copa, PA-C . Ermalinda Barrios, PA-C

## 2019-07-06 NOTE — Progress Notes (Signed)
Virtual Visit via Video Note   This visit type was conducted due to national recommendations for restrictions regarding the COVID-19 Pandemic (e.g. social distancing) in an effort to limit this patient's exposure and mitigate transmission in our community.  Due to his co-morbid illnesses, this patient is at least at moderate risk for complications without adequate follow up.  This format is felt to be most appropriate for this patient at this time.  All issues noted in this document were discussed and addressed.  A limited physical exam was performed with this format.  Please refer to the patient's chart for his consent to telehealth for [facility].   Evaluation Performed:  Follow-up visit  This visit type was conducted due to national recommendations for restrictions regarding the COVID-19 Pandemic (e.g. social distancing).  This format is felt to be most appropriate for this patient at this time.  All issues noted in this document were discussed and addressed.  No physical exam was performed (except for noted visual exam findings with Video Visits).  Please refer to the patient's chart (MyChart message for video visits and phone note for telephone visits) for the patient's consent to telehealth for [facility].  Date:  07/06/2019   ID:  Harold Davis, DOB [birth year], MRN [id number]  Patient Location:  Home  Provider location:   [place]  PCP:  Harold Pinto, MD  Sleep Medicine:  Fransico Him, MD Electrophysiologist:  None   Chief Complaint:  OSA  History of Present Illness:    Harold Davis is a 68 y.o. male who presents via audio/video conferencing for a telehealth visit today.    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  He underwent Home sleep study showing moderate to severe OSA with an AHI of 27.2/hr with nocturnal hypoxemia with time spent with O2 sats < 88% was 8.5 minutes.  He was placed on auto CPAP  and is back now for followup.    He is doing well with his CPAP device and thinks that he is getting used to it.  He tolerates the mask and feels the pressure is adequate.  Since going on CPAP he feels more rested in the am and has no significant daytime sleepiness.  He does have a lot of problems with sinus issues and has recently been treated for possible sinusitis.  The patient does not have symptoms concerning for COVID-19 infection (fever, chills, cough, or new shortness of breath).    Prior CV studies:   The following studies were reviewed today:  PAP compliance download  Past Medical History:  Diagnosis Date  . Cataract 2008   os  . Cataract 2017   od  . Degenerative joint disease (DJD) of hip   . Diverticulitis large intestine   . History of acute pancreatitis 01/15/2018  . Hx of vitrectomy Sept 2010 Left eye  02/22/2015   Retinal detachment surgery per Deloria Lair   . Hyperlipidemia 11/2017   LDL 108  . Hypertension 2009   started HCTZ & switched to Norvasc in Jan 2019  . Pancreatitis 09/2017  . Retinal detachment of left eye with single retinal tear 2010   Vitrectomy - Dr [name]   Past Surgical History:  Procedure Laterality Date  . EUS N/A 02/04/2018   Procedure: UPPER ENDOSCOPIC ULTRASOUND (EUS) RADIAL;  Surgeon: Arta Silence, MD;  Location: [facility] ENDOSCOPY;  Service: Endoscopy;  Laterality: N/A;  .  EYE SURGERY Bilateral    Left 08/2007, R 02/02/2013  . TOTAL HIP ARTHROPLASTY Right 02/19/2019   Procedure: RIGHT TOTAL HIP ARTHROPLASTY ANTERIOR APPROACH;  Surgeon: Mcarthur Rossetti, MD;  Location: [facility] ORS;  Service: Orthopedics;  Laterality: Right;     Current Meds  Medication Sig  . Ascorbic Acid (VITAMIN C) 1000 MG tablet Takes Daily (Patient taking differently: Take 1,000 mg by mouth daily. )  . b complex vitamins tablet Take 1 tablet by mouth daily.  . Cholecalciferol (VITAMIN D3) 10000 units TABS Take 10,000 Units by mouth daily.  Marland Kitchen HM OMEGA-3-6-9 FATTY  ACIDS CAPS Takes Daily (Patient taking differently: Take 1 Package by mouth daily. )  . hydrochlorothiazide (HYDRODIURIL) 25 MG tablet Take 1 tablet Daily for BP & Fluid retention  . LECITHIN PO Take 1 tablet by mouth daily.   . Misc Natural Products (RESVERATROL DIET PO) Take 1 capsule by mouth daily.  . vitamin E 1000 UNIT capsule Take 1 capsule (1,000 Units total) by mouth daily.  Marland Kitchen zinc gluconate 50 MG tablet Takes 1 tablet every other day (Patient taking differently: Take 50 mg by mouth daily. )     Allergies:   Patient has no known allergies.   Social History   Tobacco Use  . Smoking status: Never Smoker  . Smokeless tobacco: Never Used  Substance Use Topics  . Alcohol use: Yes    Comment: infrequent  . Drug use: Never     Family Hx: The patient's family history includes Cancer in his brother, father, mother, and sister; Heart disease in his brother; Stroke in his mother.  ROS:   Please see the history of present illness.     All other systems reviewed and are negative.   Labs/Other Tests and Data Reviewed:    Recent Labs: 02/20/2019: BUN 15; Creatinine, Ser 0.80; Hemoglobin 12.7; Platelets 274; Potassium 3.6; Sodium 137   Recent Lipid Panel No results found for: CHOL, TRIG, HDL, CHOLHDL, LDLCALC, LDLDIRECT  Wt Readings from Last 3 Encounters:  07/06/19 255 lb (115.7 kg)  04/08/19 260 lb (117.9 kg)  03/18/19 258 lb (117 kg)     Objective:    Vital Signs:  BP 130/77   Ht 6\' 2"  (1.88 m)   Wt 255 lb (115.7 kg)   BMI 32.74 kg/m    CONSTITUTIONAL:  Well nourished, well developed male in no acute distress.  EYES: anicteric MOUTH: oral mucosa is pink RESPIRATORY: Normal respiratory effort, symmetric expansion CARDIOVASCULAR: No peripheral edema SKIN: No rash, lesions or ulcers MUSCULOSKELETAL: no digital cyanosis NEURO: Cranial Nerves II-XII grossly intact, moves all extremities PSYCH: Intact judgement and insight.  A&O x 3, Mood/affect appropriate    ASSESSMENT & PLAN:    1.  OSA  - The pathophysiology of obstructive sleep apnea , it's cardiovascular consequences & modes of treatment including CPAP were discused with the patient in detail & they evidenced understanding.   -The patient is tolerating PAP therapy well without any problems.  -The PAP download was reviewed today and showed an AHI of 2.8/hr on auto PAP with 27% compliance in using more than 4 hours nightly.   -he patient has been using and benefiting from PAP use and will continue to benefit from therapy.  -we discussed at length issues that limit compliance . 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  Also, if is is sleeping on call he has found that with the device he may not be able to hear his phone or pager.   -he also has been having some significant problems with sinusitis recently and is being treated by his PCP and this has affected compliance as well. -I am documenting compliance issues in his note as I do not want insurance to take back his device as his compliance is related to his job and he uses it nightly when he is not on call.  2.  HTN -BP controlled -continue HCTZ 25mg  daily and Toprol XL 100mg  daily  3.  Obesity -I have encouraged  to get into a routine exercise program and cut back on carbs and portions. His exercise has been limited by recent hip surgery.  COVID-19 Education: The signs and symptoms of COVID-19 were discussed with the patient and how to seek care for testing (follow up with PCP or arrange E-visit).  The importance of social distancing was discussed today.  Patient Risk:   After full review of this patient's clinical status, I feel that they are at least moderate risk at this time.  Time:   Today, I have spent 20 minutes directly with the patient on telemedicine discussing medical problems including OSA, HTN.  We also reviewed the  symptoms of COVID 19 and the ways to protect against contracting the virus with telehealth technology.  I spent an additional 5 minutes reviewing patient's chart including PAP compliance download.  Medication Adjustments/Labs and Tests Ordered: Current medicines are reviewed at length with the patient today.  Concerns regarding medicines are outlined above.  Tests Ordered: No orders of the defined types were placed in this encounter.  Medication Changes: No orders of the defined types were placed in this encounter.   Disposition:  Follow up in 1 year(s)  Signed, Fransico Him, MD  07/06/2019 9:36 AM    [facility]

## 2019-09-16 ENCOUNTER — Ambulatory Visit: Payer: PPO | Admitting: Orthopaedic Surgery

## 2019-10-14 ENCOUNTER — Other Ambulatory Visit: Payer: Self-pay

## 2019-10-14 ENCOUNTER — Ambulatory Visit (INDEPENDENT_AMBULATORY_CARE_PROVIDER_SITE_OTHER): Payer: PPO

## 2019-10-14 ENCOUNTER — Ambulatory Visit: Payer: PPO | Admitting: Orthopaedic Surgery

## 2019-10-14 ENCOUNTER — Encounter: Payer: Self-pay | Admitting: Orthopaedic Surgery

## 2019-10-14 DIAGNOSIS — Z96641 Presence of right artificial hip joint: Secondary | ICD-10-CM

## 2019-10-14 NOTE — Progress Notes (Signed)
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  He is doing well overall.  He denies any left hip pain.  He does report some pain and numbness around his incision but overall feels like he is doing much better.  On exam he does have really good range of motion of both hips.  There is no pain in the groin with either hip in terms of range of motion.  At this point follow-up can be as needed.  X-rays show well-seated implant with bone ingrowth and no complicating features.  If he develops any issues with his hip at all he knows to come see me or call me and we will get new x-rays.  If he develops any issues with his left hip he will let us know.  We had a long thorough discussion about this.  All question concerns were answered addressed.  Follow-up can be as needed.

## 2019-11-17 ENCOUNTER — Other Ambulatory Visit: Payer: Self-pay | Admitting: Internal Medicine

## 2019-11-22 IMAGING — CT CT ANGIOGRAPHY CHEST
2 of 7 series · 18 of 46 positions shown · IV contrast (OMNIPAQUE 350)
Comparison: Report from an echocardiogram dated 02/15/2019. Prior
CT of the chest without contrast on 08/26/2003.

CLINICAL DATA: Mild dilatation of the ascending thoracic aorta by
echocardiography.

EXAM:
CT ANGIOGRAPHY CHEST WITH CONTRAST
TECHNIQUE: Multidetector CT imaging of the chest was performed using the
standard protocol during bolus administration of intravenous
contrast. Multiplanar CT image reconstructions and MIPs were
obtained to evaluate the vascular anatomy.
CONTRAST:  100mL OMNIPAQUE IOHEXOL 350 MG/ML SOLN

[Series 4: aorta 3.0 i31f 2 · axial · 0.87mm/px · z∈[-366,-54]mm · 15 of 114 slices shown]
[im 5/114  lung]
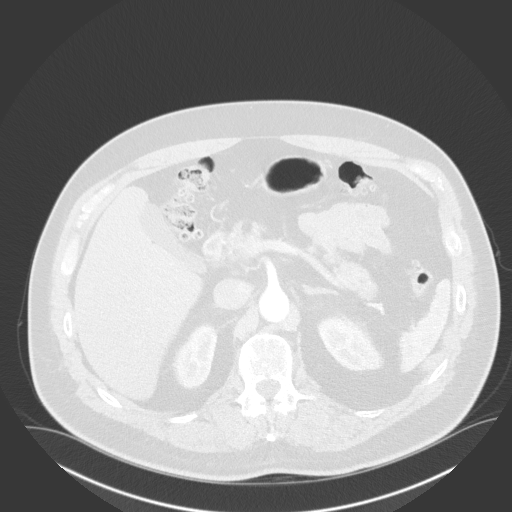
[im 13/114  soft-tissue]
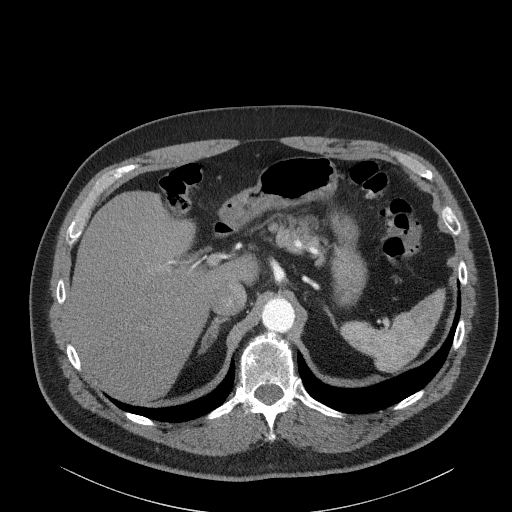
[im 21/114  lung]
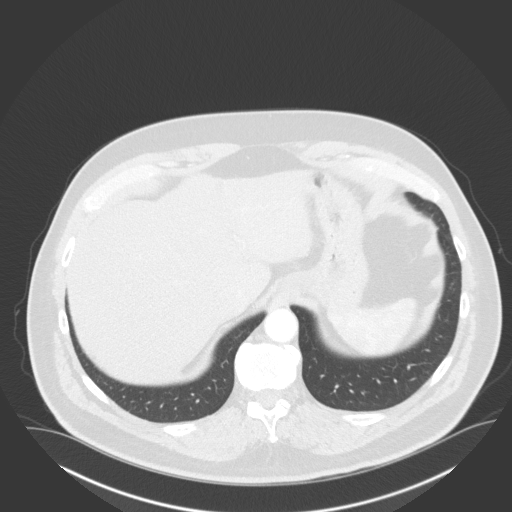
[im 30/114  soft-tissue]
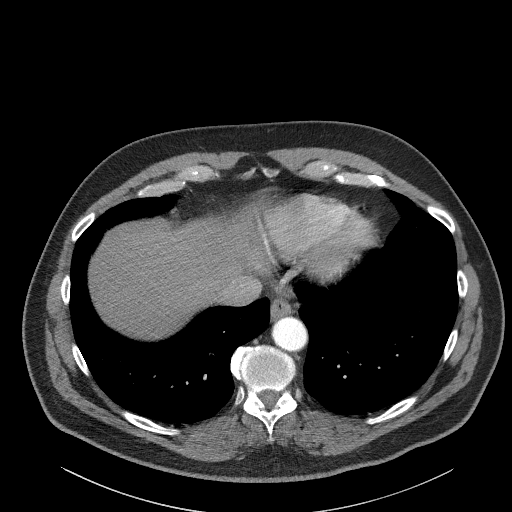
[im 34/114  lung]
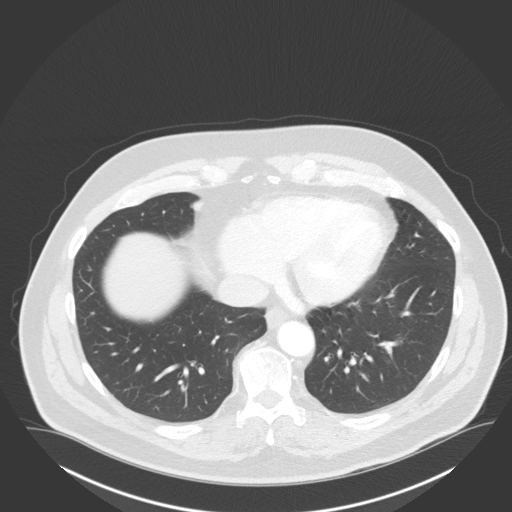
[im 42/114  soft-tissue]
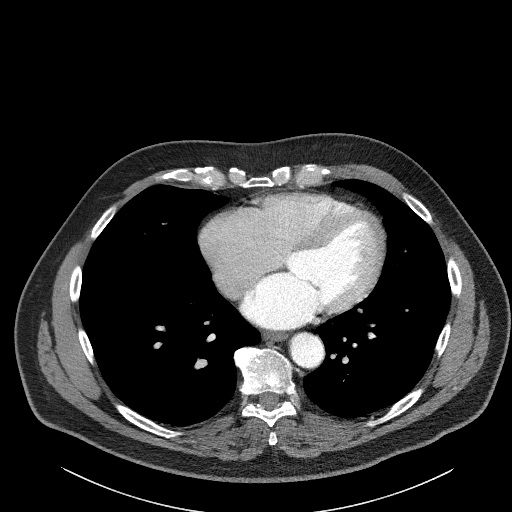
[im 51/114  lung]
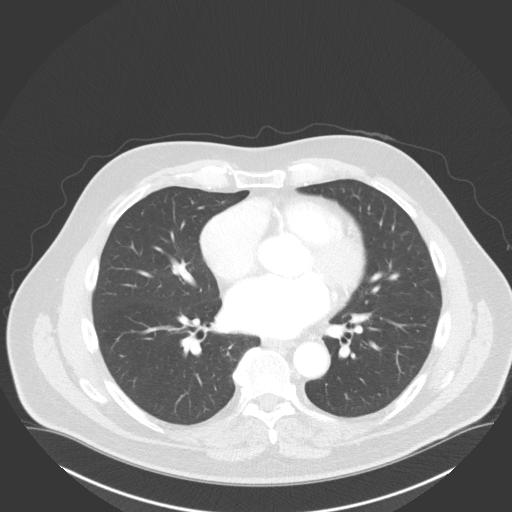
[im 59/114  soft-tissue]
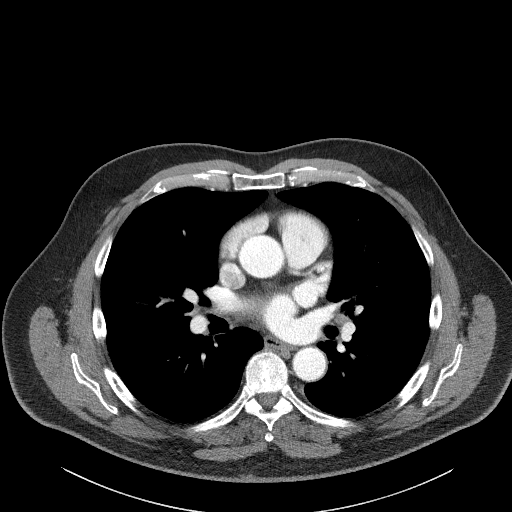
[im 63/114  lung]
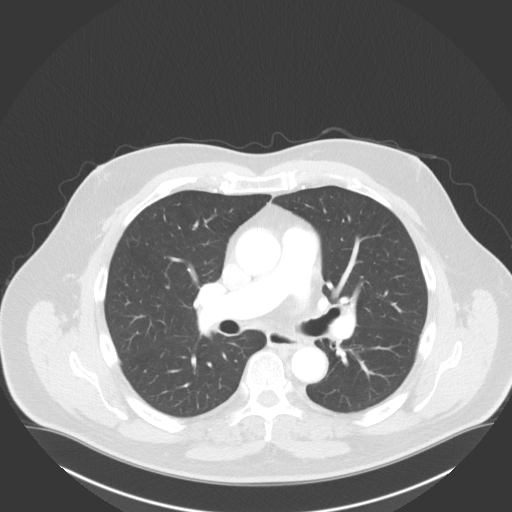
[im 72/114  soft-tissue]
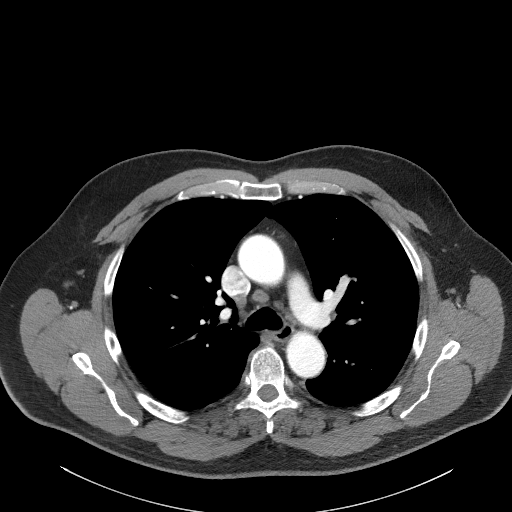
[im 80/114  lung]
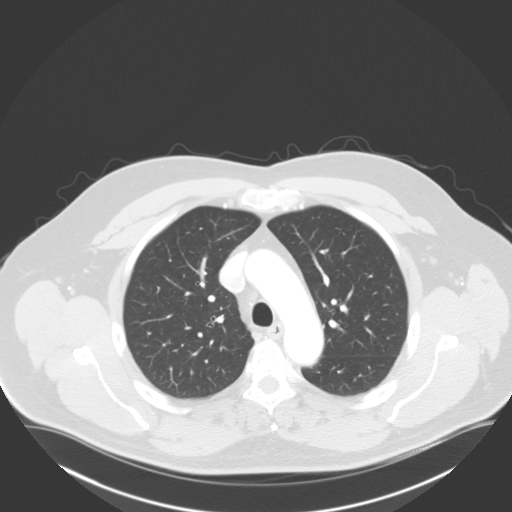
[im 84/114  soft-tissue]
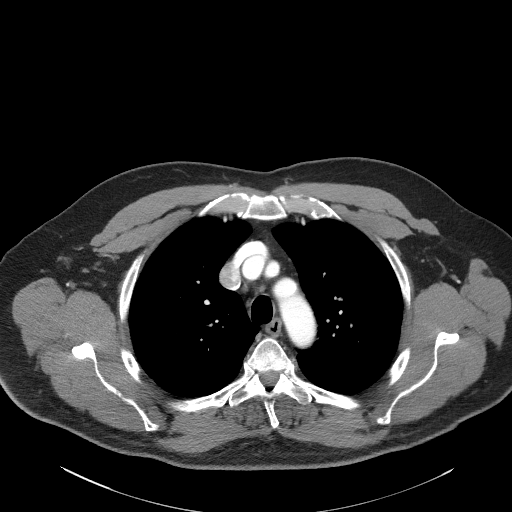
[im 93/114  lung]
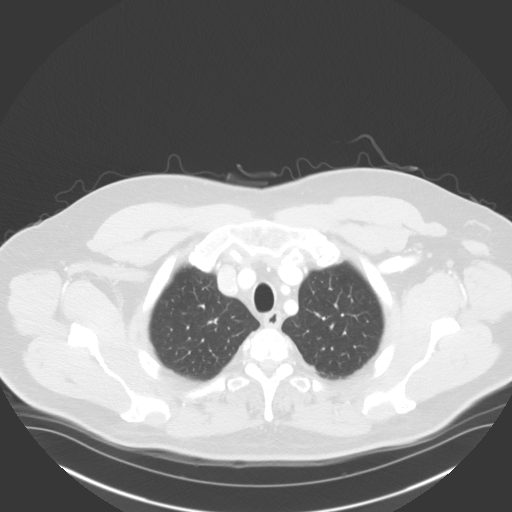
[im 101/114  soft-tissue]
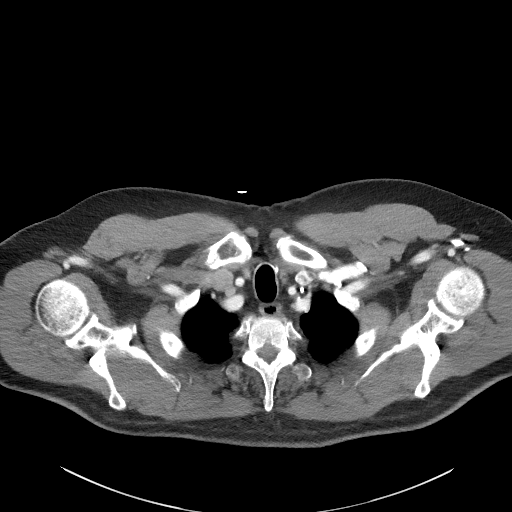
[im 109/114  lung]
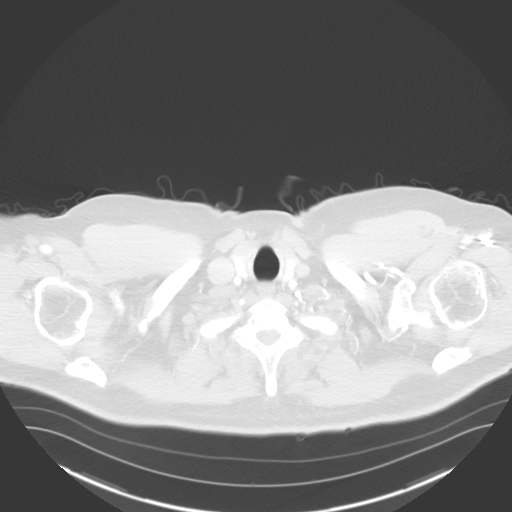

[Series 7: coronals · coronal · 0.67mm/px · 3 of 129 slices shown]
[im 33/129  soft-tissue]
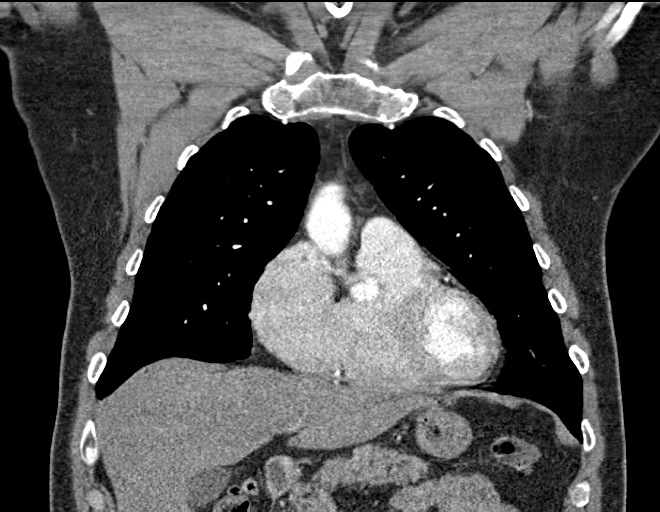
[im 65/129  soft-tissue]
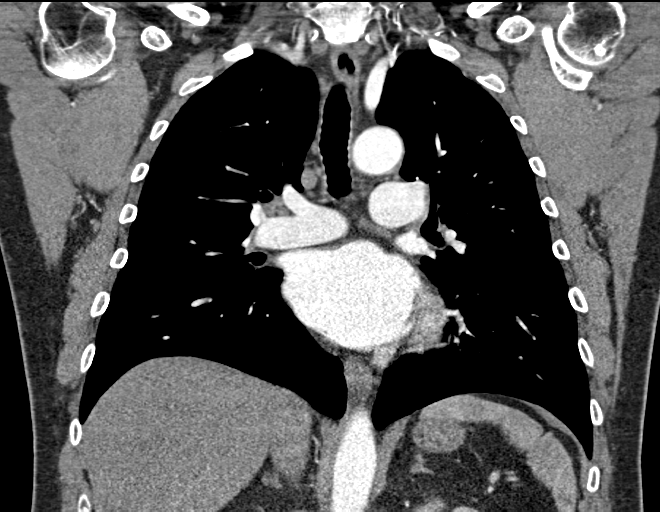
[im 97/129  soft-tissue]
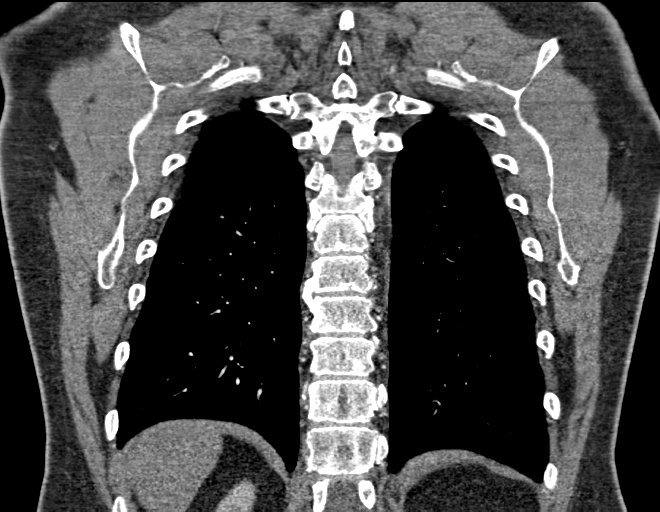

[18 of 46 positions shown; findings below may reference images not displayed]

FINDINGS: Cardiovascular: The thoracic aorta is normal in caliber by CTA. The
aortic root measures 3.8 cm at the level of the sinuses of Valsalva.
The ascending thoracic aorta measures 3.9 cm in greatest diameter.
The proximal arch measures 3.4 cm and the distal arch 3.5 cm. The
descending thoracic aorta measures 2.9 cm. There is no evidence of
aortic dissection or significant atherosclerosis. Proximal great
vessels are normally patent and demonstrate normal branching
anatomy.

The heart size is normal. No pericardial fluid identified. No
significant calcified coronary artery plaque. Central pulmonary
arteries are normal in caliber.

Mediastinum/Nodes: There are a few small nonenlarged mediastinal
lymph nodes. The largest in the lower right paratracheal region
measures 10 mm in short axis. No enlarged mediastinal, hilar, or
axillary lymph nodes. Thyroid gland, trachea, and esophagus
demonstrate no significant findings.

Lungs/Pleura: There is no evidence of pulmonary edema,
consolidation, pneumothorax, nodule or pleural fluid.

Upper Abdomen: No acute abnormality.

Musculoskeletal: No chest wall abnormality. No acute or significant
osseous findings.

Review of the MIP images confirms the above findings.
IMPRESSION: No evidence of aneurysmal disease of the thoracic aorta by CTA. The
ascending thoracic aorta measures 3.9 cm in greatest diameter.

## 2019-11-29 ENCOUNTER — Other Ambulatory Visit: Payer: Self-pay | Admitting: Internal Medicine

## 2019-11-29 DIAGNOSIS — E782 Mixed hyperlipidemia: Secondary | ICD-10-CM

## 2019-11-29 DIAGNOSIS — R7309 Other abnormal glucose: Secondary | ICD-10-CM

## 2019-11-29 DIAGNOSIS — Z79899 Other long term (current) drug therapy: Secondary | ICD-10-CM

## 2019-11-29 DIAGNOSIS — M25572 Pain in left ankle and joints of left foot: Secondary | ICD-10-CM

## 2019-11-29 DIAGNOSIS — I10 Essential (primary) hypertension: Secondary | ICD-10-CM

## 2019-11-29 DIAGNOSIS — E559 Vitamin D deficiency, unspecified: Secondary | ICD-10-CM

## 2019-11-30 ENCOUNTER — Other Ambulatory Visit: Payer: PPO

## 2019-11-30 ENCOUNTER — Other Ambulatory Visit: Payer: Self-pay | Admitting: Internal Medicine

## 2019-11-30 ENCOUNTER — Other Ambulatory Visit: Payer: Self-pay

## 2019-11-30 DIAGNOSIS — Z79899 Other long term (current) drug therapy: Secondary | ICD-10-CM | POA: Diagnosis not present

## 2019-11-30 DIAGNOSIS — M25572 Pain in left ankle and joints of left foot: Secondary | ICD-10-CM | POA: Diagnosis not present

## 2019-11-30 DIAGNOSIS — I10 Essential (primary) hypertension: Secondary | ICD-10-CM | POA: Diagnosis not present

## 2019-11-30 DIAGNOSIS — R7309 Other abnormal glucose: Secondary | ICD-10-CM | POA: Diagnosis not present

## 2019-11-30 DIAGNOSIS — E782 Mixed hyperlipidemia: Secondary | ICD-10-CM

## 2019-11-30 DIAGNOSIS — E559 Vitamin D deficiency, unspecified: Secondary | ICD-10-CM

## 2019-12-01 ENCOUNTER — Encounter: Payer: Self-pay | Admitting: *Deleted

## 2019-12-01 LAB — CBC WITH DIFFERENTIAL/PLATELET
Absolute Monocytes: 546 cells/uL (ref 200–950)
Basophils Absolute: 78 cells/uL (ref 0–200)
Basophils Relative: 1.2 %
Eosinophils Absolute: 280 cells/uL (ref 15–500)
Eosinophils Relative: 4.3 %
HCT: 34.3 % — ABNORMAL LOW (ref 38.5–50.0)
Hemoglobin: 11.2 g/dL — ABNORMAL LOW (ref 13.2–17.1)
Lymphs Abs: 1229 cells/uL (ref 850–3900)
MCH: 29.7 pg (ref 27.0–33.0)
MCHC: 32.7 g/dL (ref 32.0–36.0)
MCV: 91 fL (ref 80.0–100.0)
MPV: 11.5 fL (ref 7.5–12.5)
Monocytes Relative: 8.4 %
Neutro Abs: 4368 cells/uL (ref 1500–7800)
Neutrophils Relative %: 67.2 %
Platelets: 346 10*3/uL (ref 140–400)
RBC: 3.77 10*6/uL — ABNORMAL LOW (ref 4.20–5.80)
RDW: 13.3 % (ref 11.0–15.0)
Total Lymphocyte: 18.9 %
WBC: 6.5 10*3/uL (ref 3.8–10.8)

## 2019-12-01 LAB — COMPLETE METABOLIC PANEL WITH GFR
AG Ratio: 1.5 (calc) (ref 1.0–2.5)
ALT: 21 U/L (ref 9–46)
AST: 25 U/L (ref 10–35)
Albumin: 4.3 g/dL (ref 3.6–5.1)
Alkaline phosphatase (APISO): 65 U/L (ref 35–144)
BUN: 14 mg/dL (ref 7–25)
CO2: 30 mmol/L (ref 20–32)
Calcium: 9.5 mg/dL (ref 8.6–10.3)
Chloride: 103 mmol/L (ref 98–110)
Creat: 1.01 mg/dL (ref 0.70–1.25)
GFR, Est African American: 89 mL/min/{1.73_m2} (ref >=60)
GFR, Est Non African American: 77 mL/min/{1.73_m2} (ref >=60)
Globulin: 2.8 g/dL (calc) (ref 1.9–3.7)
Glucose, Bld: 117 mg/dL — ABNORMAL HIGH (ref 65–99)
Potassium: 4 mmol/L (ref 3.5–5.3)
Sodium: 142 mmol/L (ref 135–146)
Total Bilirubin: 0.7 mg/dL (ref 0.2–1.2)
Total Protein: 7.1 g/dL (ref 6.1–8.1)

## 2019-12-01 LAB — LIPID PANEL
Cholesterol: 158 mg/dL (ref <200)
HDL: 40 mg/dL (ref >=40)
LDL Cholesterol (Calc): 105 mg/dL (calc) — ABNORMAL HIGH
Non-HDL Cholesterol (Calc): 118 mg/dL (calc) (ref <130)
Total CHOL/HDL Ratio: 4 (calc) (ref <5.0)
Triglycerides: 51 mg/dL (ref <150)

## 2019-12-01 LAB — TSH: TSH: 1.62 mIU/L (ref 0.40–4.50)

## 2019-12-01 LAB — HEMOGLOBIN A1C
Hgb A1c MFr Bld: 5.9 % of total Hgb — ABNORMAL HIGH (ref <5.7)
Mean Plasma Glucose: 123 (calc)
eAG (mmol/L): 6.8 (calc)

## 2019-12-01 LAB — MAGNESIUM: Magnesium: 2.1 mg/dL (ref 1.5–2.5)

## 2019-12-01 LAB — URIC ACID: Uric Acid, Serum: 8.8 mg/dL — ABNORMAL HIGH (ref 4.0–8.0)

## 2019-12-01 LAB — VITAMIN D 25 HYDROXY (VIT D DEFICIENCY, FRACTURES): Vit D, 25-Hydroxy: 52 ng/mL (ref 30–100)

## 2019-12-01 LAB — C-REACTIVE PROTEIN: CRP: 15.9 mg/L — ABNORMAL HIGH (ref <8.0)

## 2019-12-01 LAB — INSULIN, RANDOM: Insulin: 9.7 u[IU]/mL

## 2019-12-01 LAB — SEDIMENTATION RATE: Sed Rate: 6 mm/h (ref 0–20)

## 2019-12-07 DIAGNOSIS — Z1159 Encounter for screening for other viral diseases: Secondary | ICD-10-CM | POA: Diagnosis not present

## 2019-12-10 DIAGNOSIS — K219 Gastro-esophageal reflux disease without esophagitis: Secondary | ICD-10-CM | POA: Diagnosis not present

## 2019-12-10 DIAGNOSIS — K921 Melena: Secondary | ICD-10-CM | POA: Diagnosis not present

## 2019-12-10 DIAGNOSIS — D123 Benign neoplasm of transverse colon: Secondary | ICD-10-CM | POA: Diagnosis not present

## 2019-12-10 DIAGNOSIS — K5730 Diverticulosis of large intestine without perforation or abscess without bleeding: Secondary | ICD-10-CM | POA: Diagnosis not present

## 2019-12-10 DIAGNOSIS — K228 Other specified diseases of esophagus: Secondary | ICD-10-CM | POA: Diagnosis not present

## 2019-12-10 DIAGNOSIS — Z800 Family history of malignant neoplasm of digestive organs: Secondary | ICD-10-CM | POA: Diagnosis not present

## 2019-12-10 DIAGNOSIS — D62 Acute posthemorrhagic anemia: Secondary | ICD-10-CM | POA: Diagnosis not present

## 2019-12-10 LAB — HM COLONOSCOPY

## 2019-12-14 DIAGNOSIS — D123 Benign neoplasm of transverse colon: Secondary | ICD-10-CM | POA: Diagnosis not present

## 2019-12-14 DIAGNOSIS — K219 Gastro-esophageal reflux disease without esophagitis: Secondary | ICD-10-CM | POA: Diagnosis not present

## 2020-01-12 ENCOUNTER — Encounter: Payer: Self-pay | Admitting: *Deleted

## 2020-01-18 ENCOUNTER — Encounter: Payer: Self-pay | Admitting: *Deleted

## 2020-01-20 ENCOUNTER — Encounter: Payer: Self-pay | Admitting: Internal Medicine

## 2020-02-07 ENCOUNTER — Encounter: Payer: Self-pay | Admitting: Adult Health

## 2020-02-07 DIAGNOSIS — G4733 Obstructive sleep apnea (adult) (pediatric): Secondary | ICD-10-CM | POA: Insufficient documentation

## 2020-02-07 DIAGNOSIS — Z9989 Dependence on other enabling machines and devices: Secondary | ICD-10-CM | POA: Insufficient documentation

## 2020-02-07 NOTE — Progress Notes (Deleted)
MEDICARE ANNUAL WELLNESS VISIT AND FOLLOW UP Assessment:   Harold Davis was seen today for medicare wellness.  Diagnoses and all orders for this visit:  Encounter for Medicare annual wellness exam Requested reports for colonoscopies; getting q5y via Dr. [name] He declines tetanus booster Getting flu vaccine annually via work Defer prevnar 13 to next visit He will contact back regarding labs - checking with hospital about annual free lab fair  Essential hypertension Continue medication Monitor blood pressure at home; call if consistently over 130/80 Continue DASH diet.   Reminder to go to the ER if any CP, SOB, nausea, dizziness, severe HA, changes vision/speech, left arm numbness and tingling and jaw pain.  Sigmoid diverticulosis Continue lifestyle modification; consider adding daily fiber supplement  Other abnormal glucose (prediabetes) Discussed disease and risks Discussed diet/exercise, weight management  Monitor A1C  Obesity (BMI 30.0-34.9) Long discussion about weight loss, diet, and exercise Recommended diet heavy in fruits and veggies and low in animal meats, cheeses, and dairy products, appropriate calorie intake Discussed appropriate weight for height  Follow up at next visit  Hyperlipidemia, unspecified hyperlipidemia type Mild elevations not requiring statin therapy; lifestyle only at this time Continue low cholesterol diet and exercise.  Check lipid panel.   Vitamin D deficiency Continue supplementation for goal of 60-100 Check vitamin D level  Anemia - recent colonoscopy per Dr. Cristina Gong with numerous diverticuloses - check CBC, iron/TIBC/ferritin today per Dr. Melford Aase   Abnormal serum uric acid  ***  S/p R hip THA ***  A. Fib with controlled ventricular response (HCC) ***   Over 30 minutes of exam, counseling, chart review, and critical decision making was performed  Future Appointments  Date Time Provider Jones  02/09/2020  2:00 PM  [name], NP [facility] None     Plan:   During the course of the visit the patient was educated and counseled about appropriate screening and preventive services including:    Pneumococcal vaccine   Influenza vaccine  Prevnar 13  Td vaccine  Screening electrocardiogram  Colorectal cancer screening  Diabetes screening  Glaucoma screening  Nutrition counseling    Subjective:  Harold Davis is a 69 y.o. male who presents for Medicare Annual Wellness Visit and 3 month follow up for HTN, hyperlipidemia, prediabetes, and vitamin D Def.   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   He had elective R hip THA by Dr. Ninfa Linden in 01/2019 and has done well since. He was found to be in asymptomatic persistent a. Fib at preop screening, ECHO 02/15/2019 showed Persistent Afib, Normal Heart Valves, Normal Ejection Fraction (60-65%), mild -moderate Atrial dilation consequent of the Afib and was referred to cardiology, saw Dr. Caryl Comes who transitioned to Reserve 5 bid ***, recommended DCCV, referred for sleep study by Dr. Radford Pax which found mod/severe OSA and was initiated on CPAP.   Labs/ tests ordered today include:  1- CT of aorta to measure root enlargement 2-  DCCV in 3 weeks  3- Home sleep study   He recently had joint pain concerning for possible gout ***, elevated uric acid, was recommended lifestyle changes and recheck to consider allopurinol *** Lab Results  Component Value Date   LABURIC 8.8 (H) 11/30/2019      CBC trending down from last year, recently reported black stools after taking iburprofen for "gout" attack - With hx/o Colon Ca in both parents and last Colon in 2006, had colonoscopy 12/10/2019 Dr  Buccini for colonoscopy which showed 1 polyp and widespread colonic diverticuloses  - Will recheck CBC along w/ ferritin & iron studies *** CBC Latest Ref Rng & Units 11/30/2019 02/20/2019  02/11/2019  WBC 3.8 - 10.8 Thousand/uL 6.5 16.7(H) 5.5  Hemoglobin 13.2 - 17.1 g/dL 11.2(L) 12.7(L) 14.0  Hematocrit 38.5 - 50.0 % 34.3(L) 38.5(L) 42.7  Platelets 140 - 400 Thousand/uL 346 274 265       BMI is There is no height or weight on file to calculate BMI., he has been working on diet and exercise as he can manage with hip pain. 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  Wt Readings from Last 3 Encounters:  07/06/19 255 lb (115.7 kg)  04/08/19 260 lb (117.9 kg)  03/18/19 258 lb (117 kg)   His blood pressure has been controlled at home by HCTZ 25 mg daily, today their BP is   He does workout. He denies chest pain, shortness of breath, dizziness.   He is not on cholesterol medication and denies myalgias. His cholesterol is not at goal, mild LDL elevation The cholesterol last visit was:   Lab Results  Component Value Date   CHOL 158 11/30/2019   HDL 40 11/30/2019   LDLCALC 105 (H) 11/30/2019   TRIG 51 11/30/2019   CHOLHDL 4.0 11/30/2019    He has been working on diet and exercise for prediabetes, and denies increased appetite, nausea, paresthesia of the feet, polydipsia, polyuria and visual disturbances. Last A1C in the office was:  Lab Results  Component Value Date   HGBA1C 5.9 (H) 11/30/2019   Last GFR Lab Results  Component Value Date   GFRNONAA 77 11/30/2019   Patient is on Vitamin D supplement, taking 10000 IU daily:  Lab Results  Component Value Date   VD25OH 52 11/30/2019        Medication Review:    Current Outpatient Medications (Cardiovascular):  .  hydrochlorothiazide (HYDRODIURIL) 25 MG tablet, Take 1 tablet Daily for BP & Fluid retention     Current Outpatient Medications (Other):  Marland Kitchen  Ascorbic Acid (VITAMIN C) 1000 MG tablet, Takes Daily (Patient taking differently: Take 1,000 mg by mouth daily. ) .  b complex vitamins tablet, Take 1 tablet by mouth daily. .  Cholecalciferol (VITAMIN D3) 10000 units TABS, Take  10,000 Units by mouth daily. Marland Kitchen  HM OMEGA-3-6-9 FATTY ACIDS CAPS, Takes Daily (Patient taking differently: Take 1 Package by mouth daily. ) .  LECITHIN PO, Take 1 tablet by mouth daily.  .  Misc Natural Products (RESVERATROL DIET PO), Take 1 capsule by mouth daily. .  vitamin E 1000 UNIT capsule, Take 1 capsule (1,000 Units total) by mouth daily. Marland Kitchen  zinc gluconate 50 MG tablet, Takes 1 tablet every other day (Patient taking differently: Take 50 mg by mouth daily. )  Allergies: No Known Allergies  Current Problems (verified) has Essential hypertension; Sigmoid diverticulosis; Abnormal glucose; Obesity (BMI 30.0-34.9); Hyperlipidemia, mixed; Vitamin D deficiency; Atrial fibrillation with controlled ventricular response ([place]); and Status post total replacement of right hip on their problem list.  Screening Tests Immunization History  Administered Date(s) Administered  . PFIZER SARS-COV-2 Vaccination 09/19/2019, 10/07/2019    Preventative care: Last colonoscopy: last 11/2019, getting every 5 years by Dr. [name]  Prior vaccinations: TD or Tdap: 2005 declines   Influenza: 2019 via hospital ***  Pneumococcal:  Prevnar13: DUE - *** Shingles/Zostavax: declines at this time  Covid 19: 2/2, 2021 pfizer  Names of Other Physician/Practitioners you currently use: 1. [facility] here for primary care 2. Dr. Kathrin Penner, eye doctor, last visit ? 2018  3. Dr. Redmond Pulling, dentist, last visit 2019  Patient Care Team: [name], MD as PCP - General (Internal Medicine)  Surgical: He  has a past surgical history that includes EUS (N/A, 02/04/2018); Eye surgery (Bilateral); and Total hip arthroplasty (Right, 02/19/2019). Family His family history includes Cancer in his brother, father, mother, and sister; Heart disease in his brother; Stroke in his mother. Social history  He reports that he has never smoked. He has never used smokeless tobacco. He reports  current alcohol use. He reports that he does not use drugs.  MEDICARE WELLNESS OBJECTIVES: Physical activity:   Cardiac risk factors:   Depression/mood screen:   Depression screen [facility] 2/9 02/26/2019  Decreased Interest 0  Down, Depressed, Hopeless 0  PHQ - 2 Score 0    ADLs:  In your present state of health, do you have any difficulty performing the following activities: 02/19/2019 02/11/2019  Hearing? N N  Vision? N N  Difficulty concentrating or making decisions? N N  Walking or climbing stairs? Y Y  Dressing or bathing? N N  Doing errands, shopping? N N  Some recent data might be hidden     Cognitive Testing  Alert? Yes  Normal Appearance?Yes  Oriented to person? Yes  Place? Yes   Time? Yes  Recall of three objects?  Yes  Can perform simple calculations? Yes  Displays appropriate judgment?Yes  Can read the correct time from a watch face?Yes  EOL planning:     Objective:   There were no vitals filed for this visit. There is no height or weight on file to calculate BMI.  Eyes: PERRLA, EOMs, conjunctiva no swelling or erythema, normal fundi and vessels. Sinuses: No frontal/maxillary tenderness ENT/Mouth: EACs patent / TMs  nl. Nares clear without erythema, swelling, mucoid exudates. Oral hygiene is good. No erythema, swelling, or exudate. Tongue normal, non-obstructing. Tonsils not swollen or erythematous. Hearing normal.  Neck: Supple, thyroid not palpable. No bruits, nodes or JVD. Respiratory: Respiratory effort normal.  BS equal and clear bilateral without rales, rhonci, wheezing or stridor. Cardio: Heart sounds are normal with regular rate and rhythm *** and no murmurs, rubs or gallops. Peripheral pulses are normal and equal bilaterally without edema/ PP 2-3(+) bilateral.  No aortic or femoral bruits. Chest: symmetric with normal excursions and percussion.  Abdomen: Soft, with Nl bowel sounds. Nontender, no guarding, rebound, hernias, masses, or organomegaly.   Lymphatics: Non tender without lymphadenopathy.  Musculoskeletal: Full ROM all peripheral extremities, joint stability, 5/5 strength, and normal gait. Skin: Warm and dry without rashes, lesions, cyanosis, clubbing or  ecchymosis.  Neuro: Cranial nerves intact, reflexes equal bilaterally. Normal muscle tone, no cerebellar symptoms. Sensation intact/  Pysch: Alert and oriented X 3 with normal affect, insight and judgment appropriate.   Medicare Attestation I have personally reviewed: The patient's medical and social history Their use of alcohol, tobacco or illicit drugs Their current medications and supplements The patient's functional ability including ADLs,fall risks, home safety risks, cognitive, and hearing and visual impairment Diet and physical activities Evidence for depression or mood disorders  The patient's weight, height, BMI, and visual acuity have been recorded in the chart.  I have made referrals, counseling, and provided education to the patient based on review of the above and I have provided the patient with a written personalized care plan for  preventive services.     Izora Ribas, NP   02/07/2020

## 2020-02-09 ENCOUNTER — Ambulatory Visit: Payer: PPO | Admitting: Adult Health

## 2020-02-09 DIAGNOSIS — R7309 Other abnormal glucose: Secondary | ICD-10-CM

## 2020-02-09 DIAGNOSIS — Z96641 Presence of right artificial hip joint: Secondary | ICD-10-CM

## 2020-02-09 DIAGNOSIS — E559 Vitamin D deficiency, unspecified: Secondary | ICD-10-CM

## 2020-02-09 DIAGNOSIS — D649 Anemia, unspecified: Secondary | ICD-10-CM

## 2020-02-09 DIAGNOSIS — K5730 Diverticulosis of large intestine without perforation or abscess without bleeding: Secondary | ICD-10-CM

## 2020-02-09 DIAGNOSIS — G4733 Obstructive sleep apnea (adult) (pediatric): Secondary | ICD-10-CM

## 2020-02-09 DIAGNOSIS — I4891 Unspecified atrial fibrillation: Secondary | ICD-10-CM

## 2020-02-09 DIAGNOSIS — Z0000 Encounter for general adult medical examination without abnormal findings: Secondary | ICD-10-CM

## 2020-02-09 DIAGNOSIS — E790 Hyperuricemia without signs of inflammatory arthritis and tophaceous disease: Secondary | ICD-10-CM

## 2020-02-09 DIAGNOSIS — E782 Mixed hyperlipidemia: Secondary | ICD-10-CM

## 2020-02-09 DIAGNOSIS — I10 Essential (primary) hypertension: Secondary | ICD-10-CM

## 2020-02-09 DIAGNOSIS — E669 Obesity, unspecified: Secondary | ICD-10-CM

## 2020-03-09 ENCOUNTER — Other Ambulatory Visit: Payer: Self-pay

## 2020-03-09 ENCOUNTER — Ambulatory Visit (INDEPENDENT_AMBULATORY_CARE_PROVIDER_SITE_OTHER): Payer: PPO | Admitting: Adult Health

## 2020-03-09 ENCOUNTER — Encounter: Payer: Self-pay | Admitting: Adult Health

## 2020-03-09 VITALS — BP 120/70 | HR 67 | Temp 96.3°F | Ht 74.0 in | Wt 273.0 lb

## 2020-03-09 DIAGNOSIS — G4733 Obstructive sleep apnea (adult) (pediatric): Secondary | ICD-10-CM

## 2020-03-09 DIAGNOSIS — Z0000 Encounter for general adult medical examination without abnormal findings: Secondary | ICD-10-CM

## 2020-03-09 DIAGNOSIS — E782 Mixed hyperlipidemia: Secondary | ICD-10-CM

## 2020-03-09 DIAGNOSIS — Z9989 Dependence on other enabling machines and devices: Secondary | ICD-10-CM

## 2020-03-09 DIAGNOSIS — E790 Hyperuricemia without signs of inflammatory arthritis and tophaceous disease: Secondary | ICD-10-CM | POA: Diagnosis not present

## 2020-03-09 DIAGNOSIS — R6889 Other general symptoms and signs: Secondary | ICD-10-CM | POA: Diagnosis not present

## 2020-03-09 DIAGNOSIS — Z1159 Encounter for screening for other viral diseases: Secondary | ICD-10-CM | POA: Diagnosis not present

## 2020-03-09 DIAGNOSIS — K5730 Diverticulosis of large intestine without perforation or abscess without bleeding: Secondary | ICD-10-CM | POA: Diagnosis not present

## 2020-03-09 DIAGNOSIS — E559 Vitamin D deficiency, unspecified: Secondary | ICD-10-CM

## 2020-03-09 DIAGNOSIS — R7309 Other abnormal glucose: Secondary | ICD-10-CM

## 2020-03-09 DIAGNOSIS — Z96641 Presence of right artificial hip joint: Secondary | ICD-10-CM

## 2020-03-09 DIAGNOSIS — Z0001 Encounter for general adult medical examination with abnormal findings: Secondary | ICD-10-CM

## 2020-03-09 DIAGNOSIS — I10 Essential (primary) hypertension: Secondary | ICD-10-CM | POA: Diagnosis not present

## 2020-03-09 DIAGNOSIS — I4891 Unspecified atrial fibrillation: Secondary | ICD-10-CM

## 2020-03-09 NOTE — Patient Instructions (Addendum)
Harold Davis , Thank you for taking time to come for your Medicare Wellness Visit. I appreciate your ongoing commitment to your health goals. Please review the following plan we discussed and let me know if I can assist you in the future.   These are the goals we discussed: Goals    .  Weight (lb) < 250 lb (113.4 kg) (pt-stated)       This is a list of the screening recommended for you and due dates:  Health Maintenance  Topic Date Due  .  Hepatitis C: One time screening is recommended by Center for Disease Control  (CDC) for  adults born from [birth year] through 1963.   Never done  . Tetanus Vaccine  03/09/2021*  . Flu Shot  04/30/2020  . Colon Cancer Screening  12/09/2024  . COVID-19 Vaccine  Completed  . Pneumonia vaccines  Discontinued  *Topic was postponed. The date shown is not the original due date.     Please schedule follow up with Dr. Kathrin Penner  Overdue follow up with Dentist      Aim for 7+ servings of fruits and vegetables daily  65-80+ fluid ounces of water or unsweet tea for healthy kidneys  Limit to max 1-2 drink of alcohol per day;   Limit animal fats in diet for cholesterol and heart health - choose grass fed whenever available  Avoid highly processed foods, and foods high in saturated/trans fats  Aim for low stress - take time to unwind and care for your mental health  Aim for 150 min of moderate intensity exercise weekly for heart health, and weights twice weekly for bone health     Drink 1/2 your body weight in fluid ounces of water daily; drink a tall glass of water 30 min before meals  Don't eat until you're stuffed- listen to your stomach and eat until you are 80% full   Try eating off of a salad plate; wait 10 min after finishing before going back for seconds  Start by eating the vegetables on your plate; aim for 50% of your meals to be fruits or vegetables  Then eat your protein - lean meats (grass fed if possible), fish, beans, nuts in  moderation  Eat your carbs/starch last ONLY if you still are hungry. If you can, stop before finishing it all  Avoid sugar and flour - the closer it looks to it's original form in nature, typically the better it is for you  Splurge in moderation - "assign" days when you get to splurge and have the "bad stuff" - I like to follow a 80% - 20% plan- "good" choices 80 % of the time, "bad" choices in moderation 20% of the time  Simple equation is: Calories out > calories in = weight loss - even if you eat the bad stuff, if you limit portions, you will still lose weight     High-Fiber Diet Fiber, also called dietary fiber, is a type of carbohydrate that is found in fruits, vegetables, whole grains, and beans. A high-fiber diet can have many health benefits. Your health care provider may recommend a high-fiber diet to help:  Prevent constipation. Fiber can make your bowel movements more regular.  Lower your cholesterol.  Relieve the following conditions: ? Swelling of veins in the anus (hemorrhoids). ? Swelling and irritation (inflammation) of specific areas of the digestive tract (uncomplicated diverticulosis). ? A problem of the large intestine (colon) that sometimes causes pain and diarrhea (irritable bowel syndrome, IBS).  Prevent overeating as part of a weight-loss plan.  Prevent heart disease, type 2 diabetes, and certain cancers. What is my plan? The recommended daily fiber intake in grams (g) includes:  38 g for men age 60 or younger.  30 g for men over age 58.  74 g for women age 26 or younger.  21 g for women over age 9. You can get the recommended daily intake of dietary fiber by:  Eating a variety of fruits, vegetables, grains, and beans.  Taking a fiber supplement, if it is not possible to get enough fiber through your diet. What do I need to know about a high-fiber diet?  It is better to get fiber through food sources rather than from fiber supplements. There is  not a lot of research about how effective supplements are.  Always check the fiber content on the nutrition facts label of any prepackaged food. Look for foods that contain 5 g of fiber or more per serving.  Talk with a diet and nutrition specialist (dietitian) if you have questions about specific foods that are recommended or not recommended for your medical condition, especially if those foods are not listed below.  Gradually increase how much fiber you consume. If you increase your intake of dietary fiber too quickly, you may have bloating, cramping, or gas.  Drink plenty of water. Water helps you to digest fiber. What are tips for following this plan?  Eat a wide variety of high-fiber foods.  Make sure that half of the grains that you eat each day are whole grains.  Eat breads and cereals that are made with whole-grain flour instead of refined flour or white flour.  Eat brown rice, bulgur wheat, or millet instead of white rice.  Start the day with a breakfast that is high in fiber, such as a cereal that contains 5 g of fiber or more per serving.  Use beans in place of meat in soups, salads, and pasta dishes.  Eat high-fiber snacks, such as berries, raw vegetables, nuts, and popcorn.  Choose whole fruits and vegetables instead of processed forms like juice or sauce. What foods can I eat?  Fruits Berries. Pears. Apples. Oranges. Avocado. Prunes and raisins. Dried figs. Vegetables Sweet potatoes. Spinach. Kale. Artichokes. Cabbage. Broccoli. Cauliflower. Green peas. Carrots. Squash. Grains Whole-grain breads. Multigrain cereal. Oats and oatmeal. Brown rice. Barley. Bulgur wheat. [place]. Quinoa. Bran muffins. Popcorn. Rye wafer crackers. Meats and other proteins Navy, kidney, and pinto beans. Soybeans. Split peas. Lentils. Nuts and seeds. Dairy Fiber-fortified yogurt. Beverages Fiber-fortified soy milk. Fiber-fortified orange juice. Other foods Fiber bars. The items listed  above may not be a complete list of recommended foods and beverages. Contact a dietitian for more options. What foods are not recommended? Fruits Fruit juice. Cooked, strained fruit. Vegetables Fried potatoes. Canned vegetables. Well-cooked vegetables. Grains White bread. Pasta made with refined flour. White rice. Meats and other proteins Fatty cuts of meat. Fried chicken or fried fish. Dairy Milk. Yogurt. Cream cheese. Sour cream. Fats and oils Butters. Beverages Soft drinks. Other foods Cakes and pastries. The items listed above may not be a complete list of foods and beverages to avoid. Contact a dietitian for more information. Summary  Fiber is a type of carbohydrate. It is found in fruits, vegetables, whole grains, and beans.  There are many health benefits of eating a high-fiber diet, such as preventing constipation, lowering blood cholesterol, helping with weight loss, and reducing your risk of heart disease, diabetes, and certain  cancers.  Gradually increase your intake of fiber. Increasing too fast can result in cramping, bloating, and gas. Drink plenty of water while you increase your fiber.  The best sources of fiber include whole fruits and vegetables, whole grains, nuts, seeds, and beans. This information is not intended to replace advice given to you by your health care provider. Make sure you discuss any questions you have with your health care provider. Document Revised: 07/21/2017 Document Reviewed: 07/21/2017 Elsevier Patient Education  2020 [employer].

## 2020-03-09 NOTE — Progress Notes (Signed)
MEDICARE ANNUAL WELLNESS VISIT AND FOLLOW UP Assessment:   Harold Davis was seen today for medicare wellness.  Diagnoses and all orders for this visit:  Encounter for Medicare annual wellness exam He declines tetanus booster, pneumonia vaccines, shingrix  Getting flu vaccine annually via work Encouraged to schedule follow up with vision and dental providers   Essential hypertension Continue medication Monitor blood pressure at home; call if consistently over 130/80 Continue DASH diet.   Reminder to go to the ER if any CP, SOB, nausea, dizziness, severe HA, changes vision/speech, left arm numbness and tingling and jaw pain.  Sigmoid diverticulosis Continue lifestyle modification; consider adding daily fiber supplement  Other abnormal glucose (prediabetes) Discussed disease and risks Discussed diet/exercise, weight management  Monitor A1C routinely; defer today as had check <90 days ago  Morbid obesity - BMI 30+ with OSA Long discussion about weight loss, diet, and exercise Recommended diet heavy in fruits and veggies and low in animal meats, cheeses, and dairy products, appropriate calorie intake Discussed appropriate weight for height  Follow up at next visit  Hyperlipidemia, unspecified hyperlipidemia type Mild elevations not requiring statin therapy; lifestyle only at this time Continue low cholesterol diet and exercise.  Monitor routinely; defer today as had <90 days ago   Vitamin D deficiency Continue supplementation for goal of 60-100 Defer vitamin D level  Anemia  - recent colonoscopy per Dr. Cristina Gong with numerous diverticuloses - resolved on CBC at work 3/24, recheck CBC, add iron if declined  Abnormal serum uric acid  Has adjusted lifestyle;  Recheck uric acid  S/p R hip THA Doing well without complications  A. Fib with controlled ventricular response ([place]) EXTENDED discussion today regarding treatment Discussed chadsvasc score of 2; 2.9% risk of clot/CVA,  would recommend anticoagulation, Dr. Caryl Comes recommended CV Patient has considered at length, states he understands risks (patient is surgeon/MD) and has decided to NOT PURSUE He monitors via apple watch, taking ASA 81. Understands this is not proven to reduce risk of a. Fib related clots patient to go to ER if there is weakness, thunderclap headache, visual changes, or any concerning factors Continue to monitor;   Over 30 minutes of exam, counseling, chart review, and critical decision making was performed  Future Appointments  Date Time Provider [name]  08/11/2020 11:00 AM [name], MD [facility] None  03/13/2021  2:00 PM [name], NP [facility] None     Plan:   During the course of the visit the patient was educated and counseled about appropriate screening and preventive services including:    Pneumococcal vaccine   Influenza vaccine  Prevnar 13  Td vaccine  Screening electrocardiogram  Colorectal cancer screening  Diabetes screening  Glaucoma screening  Nutrition counseling    Subjective:  Harold Davis is a 69 y.o. male who presents for Medicare Annual Wellness Visit and 3 month follow up for HTN, hyperlipidemia, prediabetes, and vitamin D Def.   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 Ninfa Linden in 01/2019 and has done well since (mild residual numbness of the thigh).   He was found to be in asymptomatic persistent a. Fib at preop screening, ECHO 02/15/2019 showed Persistent Afib, Normal Heart Valves, Normal Ejection Fraction (60-65%), mild -moderate Atrial dilation consequent of the Afib and was referred to cardiology, saw Dr. Caryl Comes who transitioned to apixoban 5 mg BID (Cha2ds2-vasc of 2), recommended DCCV, referred for sleep study by Dr. Radford Pax which found mod/severe  OSA and was initiated on CPAP, reports is trying to increase use, has been using using intermittently, has used 3 times in the past  week.   Patient preferred to avoid NOAC (has seen numerous complications with patients personally), and today shares he has considered risk of CVA (2.9% per chadsvasc score), after understanding risks, has decided wouldn't pursue aggressive treatment either way. Declines follow up with cardiology, would not pursue DCCV, declines NOAC. Tracking with apple watch.   He recently had joint pain concerning for possible gout, elevated uric acid, was recommended lifestyle changes and recheck to consider allopurinol. He reports has been avoiding red meat, no recurrent sx since that time -  Lab Results  Component Value Date   LABURIC 8.8 (H) 11/30/2019   CBC trending down from last year, recently reported black stools after taking iburprofen/prednisone for "gout" attack, With hx/o Colon Ca in both parents and last Colon in 2006, had negative and colonoscopy 12/10/2019 Dr Cristina Gong for colonoscopy which showed 1 polyp and widespread colonic diverticuloses.   - CBC was rechecked 12/22/2019 at work, was completely normal at that time (see labs scanned in) CBC Latest Ref Rng & Units 11/30/2019 02/20/2019 02/11/2019  WBC 3.8 - 10.8 Thousand/uL 6.5 16.7(H) 5.5  Hemoglobin 13.2 - 17.1 g/dL 11.2(L) 12.7(L) 14.0  Hematocrit 38 - 50 % 34.3(L) 38.5(L) 42.7  Platelets 140 - 400 Thousand/uL 346 274 265   BMI is Body mass index is 35.05 kg/m., he has been working on diet and exercise. 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  Doing [place], greek yogurt, planning to restart a modified keto type diet, protein + low starch veggies  Wt Readings from Last 3 Encounters:  03/09/20 273 lb (123.8 kg)  07/06/19 255 lb (115.7 kg)  04/08/19 260 lb (117.9 kg)   His blood pressure has been controlled at home by HCTZ 25 mg daily, today their BP is BP: 120/70 He does workout. He denies chest pain, shortness of breath, dizziness.   He is not  on cholesterol medication and denies myalgias. His cholesterol is not at goal, mild LDL elevation Had recheck on 12/22/2019 at work, LDL was 93 at that time  The cholesterol last visit was:   Lab Results  Component Value Date   CHOL 158 11/30/2019   HDL 40 11/30/2019   LDLCALC 105 (H) 11/30/2019   TRIG 51 11/30/2019   CHOLHDL 4.0 11/30/2019    He has been working on diet and exercise for prediabetes, and denies increased appetite, nausea, paresthesia of the feet, polydipsia, polyuria and visual disturbances.  Had A1C checked at work on 12/22/2019, was 5.7% Last A1C in the office was:  Lab Results  Component Value Date   HGBA1C 5.9 (H) 11/30/2019   Last GFR Lab Results  Component Value Date   GFRNONAA 77 11/30/2019   Patient is on Vitamin D supplement, taking 10000 IU daily:  Lab Results  Component Value Date   VD25OH 52 11/30/2019        Medication Review:    Current Outpatient Medications (Cardiovascular):  .  hydrochlorothiazide (HYDRODIURIL) 25 MG tablet, Take 1 tablet Daily for BP & Fluid retention     Current Outpatient Medications (Other):  Marland Kitchen  Ascorbic Acid (VITAMIN C) 1000 MG tablet, Takes Daily (Patient taking differently: Take 1,000 mg by mouth daily. ) .  b complex vitamins tablet, Take 1 tablet by mouth  daily. .  Cholecalciferol (VITAMIN D3) 10000 units TABS, Take 10,000 Units by mouth daily. Marland Kitchen  HM OMEGA-3-6-9 FATTY ACIDS CAPS, Takes Daily (Patient taking differently: Take 1 Package by mouth daily. ) .  LECITHIN PO, Take 1 tablet by mouth daily.  .  Misc Natural Products (RESVERATROL DIET PO), Take 1 capsule by mouth daily. .  vitamin E 1000 UNIT capsule, Take 1 capsule (1,000 Units total) by mouth daily. Marland Kitchen  zinc gluconate 50 MG tablet, Takes 1 tablet every other day (Patient taking differently: Take 50 mg by mouth daily. )  Allergies: No Known Allergies  Current Problems (verified) has Essential hypertension; Sigmoid diverticulosis; Abnormal glucose;  Morbid obesity ([place]) - BMI 30+ with OSA; Hyperlipidemia, mixed; Vitamin D deficiency; Atrial fibrillation with controlled ventricular response ([place]); Status post total replacement of right hip; and OSA on CPAP on their problem list.  Screening Tests Immunization History  Administered Date(s) Administered  . PFIZER SARS-COV-2 Vaccination 09/19/2019, 10/07/2019    Preventative care: Last colonoscopy: last 11/2019, getting every 5 years by Dr. Cristina Gong, diverticulosis and 1 polyp (benign)  Prior vaccinations: TD or Tdap: 2005 declines   Influenza: 2020 via hospital   Pneumococcal: declines  Prevnar13: declines Shingles/Zostavax: declines shingrix  Covid 19: 2/2, 2021 [place] of Other Physician/Practitioners you currently use: 1. [facility] here for primary care 2. Dr. Kathrin Penner, eye doctor, last visit ? 2018, needs to follow up   3. Dr. Redmond Pulling, dentist, last visit 2019  Patient Care Team: [name], MD as PCP - General (Internal Medicine)  Surgical: He  has a past surgical history that includes EUS (N/A, 02/04/2018); Eye surgery (Bilateral); and Total hip arthroplasty (Right, 02/19/2019). Family His family history includes Cancer in his brother, father, mother, and sister; Heart disease in his brother; Stroke in his mother. Social history  He reports that he has never smoked. He has never used smokeless tobacco. He reports current alcohol use. He reports that he does not use drugs.  MEDICARE WELLNESS OBJECTIVES: Physical activity: Current Exercise Habits: The patient does not participate in regular exercise at present, Exercise limited by: None identified Cardiac risk factors: Cardiac Risk Factors include: dyslipidemia;advanced age (>72men, >82 women);hypertension;male gender;obesity (BMI >30kg/m2);sedentary lifestyle Depression/mood screen:   Depression screen [facility] 2/9 03/09/2020  Decreased Interest 0  Down, Depressed, Hopeless 0   PHQ - 2 Score 0    ADLs:  In your present state of health, do you have any difficulty performing the following activities: 03/09/2020  Hearing? N  Vision? N  Difficulty concentrating or making decisions? N  Walking or climbing stairs? N  Dressing or bathing? N  Doing errands, shopping? N  Some recent data might be hidden     Cognitive Testing  Alert? Yes  Normal Appearance?Yes  Oriented to person? Yes  Place? Yes   Time? Yes  Recall of three objects?  Yes  Can perform simple calculations? Yes  Displays appropriate judgment?Yes  Can read the correct time from a watch face?Yes  EOL planning: Does Patient Have a Medical Advance Directive?: Yes Type of Advance Directive: Healthcare Power of Attorney, Living will Does patient want to make changes to medical advance directive?: No - Patient declined Copy of [place] in Chart?: No - copy requested Would patient like information on creating a medical advance directive?: No - Patient declined   Objective:   Today's Vitals   03/09/20 1350  BP: 120/70  Pulse: 67  Temp: (!) 96.3  F (35.7 C)  SpO2: 95%  Weight: 273 lb (123.8 kg)  Height: 6\' 2"  (1.88 m)   Body mass index is 35.05 kg/m.  Eyes: PERRLA, EOMs, conjunctiva no swelling or erythema Sinuses: No frontal/maxillary tenderness ENT/Mouth: EACs patent / TMs  nl. Nares clear without erythema, swelling, mucoid exudates. Oral hygiene is good. No erythema, swelling, or exudate. Tongue normal, non-obstructing. Tonsils not swollen or erythematous. Hearing normal.  Neck: Supple, thyroid not palpable. No bruits, nodes or JVD. Respiratory: Respiratory effort normal.  BS equal and clear bilateral without rales, rhonci, wheezing or stridor. Cardio: Heart sounds irregularly irregular and no murmurs. Peripheral pulses are normal and equal bilaterally without edema/ PP 2-3(+) bilateral.   Chest: symmetric with normal excursions and percussion.  Abdomen: Soft, with Nl  bowel sounds. Nontender, no guarding, rebound, hernias, masses, or organomegaly.  Lymphatics: Non tender without lymphadenopathy.  Musculoskeletal: Full ROM all peripheral extremities, joint stability, 5/5 strength, and normal gait. Skin: Warm and dry without rashes, lesions, cyanosis, clubbing or  ecchymosis.  Neuro: Cranial nerves intact, reflexes equal bilaterally. Normal muscle tone, no cerebellar symptoms. Sensation intact/  Pysch: Alert and oriented X 3 with normal affect, insight and judgment appropriate.   Medicare Attestation I have personally reviewed: The patient's medical and social history Their use of alcohol, tobacco or illicit drugs Their current medications and supplements The patient's functional ability including ADLs,fall risks, home safety risks, cognitive, and hearing and visual impairment Diet and physical activities Evidence for depression or mood disorders  The patient's weight, height, BMI, and visual acuity have been recorded in the chart.  I have made referrals, counseling, and provided education to the patient based on review of the above and I have provided the patient with a written personalized care plan for preventive services.     Izora Ribas, NP   03/09/2020

## 2020-03-14 DIAGNOSIS — L821 Other seborrheic keratosis: Secondary | ICD-10-CM | POA: Diagnosis not present

## 2020-05-22 ENCOUNTER — Other Ambulatory Visit: Payer: Self-pay | Admitting: Internal Medicine

## 2020-05-22 DIAGNOSIS — I10 Essential (primary) hypertension: Secondary | ICD-10-CM

## 2020-05-29 ENCOUNTER — Other Ambulatory Visit (HOSPITAL_COMMUNITY): Payer: Self-pay | Admitting: Physician Assistant

## 2020-05-29 DIAGNOSIS — I10 Essential (primary) hypertension: Secondary | ICD-10-CM

## 2020-05-29 DIAGNOSIS — I4891 Unspecified atrial fibrillation: Secondary | ICD-10-CM

## 2020-05-29 DIAGNOSIS — U071 COVID-19: Secondary | ICD-10-CM

## 2020-05-29 NOTE — Progress Notes (Signed)
I connected by phone with Harold Davis on 05/29/2020 at 8:29 AM to discuss the potential use of a new treatment for mild to moderate COVID-19 viral infection in non-hospitalized patients.  This patient is a 69 y.o. male that meets the FDA criteria for Emergency Use Authorization of COVID monoclonal antibody casirivimab/imdevimab.  Has a (+) direct SARS-CoV-2 viral test result  Has mild or moderate COVID-19   Is NOT hospitalized due to COVID-19  Is within 10 days of symptom onset  Has at least one of the high risk factor(s) for progression to severe COVID-19 and/or hospitalization as defined in EUA.  Specific high risk criteria : Older age (>/= 67 yo), BMI > 25 and Cardiovascular disease or hypertension   I have spoken and communicated the following to the patient or parent/caregiver regarding COVID monoclonal antibody treatment:  1. FDA has authorized the emergency use for the treatment of mild to moderate COVID-19 in adults and pediatric patients with positive results of direct SARS-CoV-2 viral testing who are 74 years of age and older weighing at least 40 kg, and who are at high risk for progressing to severe COVID-19 and/or hospitalization.  2. The significant known and potential risks and benefits of COVID monoclonal antibody, and the extent to which such potential risks and benefits are unknown.  3. Information on available alternative treatments and the risks and benefits of those alternatives, including clinical trials.  4. Patients treated with COVID monoclonal antibody should continue to self-isolate and use infection control measures (e.g., wear mask, isolate, social distance, avoid sharing personal items, clean and disinfect "high touch" surfaces, and frequent handwashing) according to CDC guidelines.   5. The patient or parent/caregiver has the option to accept or refuse COVID monoclonal antibody treatment.  After reviewing this information with the patient, The patient  agreed to proceed with receiving casirivimab\imdevimab infusion and will be provided a copy of the Fact sheet prior to receiving the infusion.  Sx onset 8/28. Set up for infusion on 8/31 @ 8:30am. Directions given to [facility]. Pt is aware that insurance will be charged an infusion fee. Pt is fully vaccinated with [employer].   [name] 05/29/2020 8:29 AM

## 2020-05-30 ENCOUNTER — Ambulatory Visit (HOSPITAL_COMMUNITY)
Admission: RE | Admit: 2020-05-30 | Discharge: 2020-05-30 | Disposition: A | Discharge status: Home / Self Care | Payer: Medicare Other | Source: Ambulatory Visit | Attending: Pulmonary Disease | Admitting: Pulmonary Disease

## 2020-05-30 DIAGNOSIS — U071 COVID-19: Secondary | ICD-10-CM | POA: Diagnosis present

## 2020-05-30 DIAGNOSIS — I10 Essential (primary) hypertension: Secondary | ICD-10-CM

## 2020-05-30 DIAGNOSIS — Z23 Encounter for immunization: Secondary | ICD-10-CM | POA: Insufficient documentation

## 2020-05-30 DIAGNOSIS — I4891 Unspecified atrial fibrillation: Secondary | ICD-10-CM

## 2020-05-30 MED ORDER — METHYLPREDNISOLONE SODIUM SUCC 125 MG IJ SOLR: 125.0000 mg | Freq: Once | INTRAMUSCULAR | Status: DC | PRN

## 2020-05-30 MED ORDER — DIPHENHYDRAMINE HCL 50 MG/ML IJ SOLN: 50.0000 mg | Freq: Once | INTRAMUSCULAR | Status: DC | PRN

## 2020-05-30 MED ORDER — SODIUM CHLORIDE 0.9 % IV SOLN
1200.0000 mg | Freq: Once | INTRAVENOUS | Status: AC
  Administered 2020-05-30: 1200 mg via INTRAVENOUS

## 2020-05-30 MED ORDER — FAMOTIDINE IN NACL 20-0.9 MG/50ML-% IV SOLN: 20.0000 mg | Freq: Once | INTRAVENOUS | Status: DC | PRN

## 2020-05-30 MED ORDER — ALBUTEROL SULFATE HFA 108 (90 BASE) MCG/ACT IN AERS: 2.0000 | INHALATION_SPRAY | Freq: Once | RESPIRATORY_TRACT | Status: DC | PRN

## 2020-05-30 MED ORDER — SODIUM CHLORIDE 0.9 % IV SOLN: INTRAVENOUS | Status: DC | PRN

## 2020-05-30 MED ORDER — EPINEPHRINE 0.3 MG/0.3ML IJ SOAJ: 0.3000 mg | Freq: Once | INTRAMUSCULAR | Status: DC | PRN

## 2020-05-30 NOTE — Progress Notes (Signed)
°  Diagnosis: COVID-19  Physician:  Procedure: [facility] Med: casirivimab\imdevimab infusion - Provided patient with casirivimab\imdevimab fact sheet for patients, parents and caregivers prior to infusion.  Complications: No immediate complications noted.  Discharge: Discharged home   Dorene Sorrow 05/30/2020

## 2020-05-30 NOTE — Progress Notes (Signed)
  Diagnosis: COVID-19  Physician:Dr Wright  Procedure: [facility] Med: casirivimab\imdevimab infusion - Provided patient with casirivimab\imdevimab fact sheet for patients, parents and caregivers prior to infusion.  Complications: No immediate complications noted.  Discharge: Discharged home   Harold Davis 05/30/2020  

## 2020-05-30 NOTE — Discharge Instructions (Signed)
10 Things You Can Do to Manage Your COVID-19 Symptoms at Home If you have possible or confirmed COVID-19: 1. Stay home from work and school. And stay away from other public places. If you must go out, avoid using any kind of public transportation, ridesharing, or taxis. 2. Monitor your symptoms carefully. If your symptoms get worse, call your healthcare provider immediately. 3. Get rest and stay hydrated. 4. If you have a medical appointment, call the healthcare provider ahead of time and tell them that you have or may have COVID-19. 5. For medical emergencies, call 911 and notify the dispatch personnel that you have or may have COVID-19. 6. Cover your cough and sneezes with a tissue or use the inside of your elbow. 7. Wash your hands often with soap and water for at least 20 seconds or clean your hands with an alcohol-based hand sanitizer that contains at least 60% alcohol. 8. As much as possible, stay in a specific room and away from other people in your home. Also, you should use a separate bathroom, if available. If you need to be around other people in or outside of the home, wear a mask. 9. Avoid sharing personal items with other people in your household, like dishes, towels, and bedding. 10. Clean all surfaces that are touched often, like counters, tabletops, and doorknobs. Use household cleaning sprays or wipes according to the label instructions. [email or web address] What types of side effects do monoclonal antibody drugs cause?  Common side effects  In general, the more common side effects caused by monoclonal antibody drugs include:  Allergic reactions, such as hives or itching  Flu-like signs and symptoms, including chills, fatigue, fever, and muscle aches and pains  Nausea, vomiting  Diarrhea  Skin rashes  Low blood pressure   The CDC is recommending patients who receive monoclonal antibody treatments wait at least 90 days before being vaccinated.  Currently, there are  no data on the safety and efficacy of mRNA COVID-19 vaccines in persons who received monoclonal antibodies or convalescent plasma as part of COVID-19 treatment. Based on the estimated half-life of such therapies as well as evidence suggesting that reinfection is uncommon in the 90 days after initial infection, vaccination should be deferred for at least 90 days, as a precautionary measure until additional information becomes available, to avoid interference of the antibody treatment with vaccine-induced immune responses. 03/31/2019 This information is not intended to replace advice given to you by your health care provider. Make sure you discuss any questions you have with your health care provider. Document Revised: 09/02/2019 Document Reviewed: 09/02/2019 Elsevier Patient Education  [place].

## 2020-06-02 ENCOUNTER — Other Ambulatory Visit: Payer: Self-pay | Admitting: Internal Medicine

## 2020-06-02 DIAGNOSIS — M10079 Idiopathic gout, unspecified ankle and foot: Secondary | ICD-10-CM

## 2020-06-02 MED ORDER — COLCHICINE 0.6 MG PO TABS: ORAL_TABLET | ORAL | 2 refills | Status: DC

## 2020-06-02 MED ORDER — ALLOPURINOL 300 MG PO TABS: ORAL_TABLET | ORAL | 3 refills | Status: DC

## 2020-06-02 MED ORDER — DEXAMETHASONE 4 MG PO TABS: ORAL_TABLET | ORAL | 1 refills | Status: DC

## 2020-06-05 ENCOUNTER — Other Ambulatory Visit: Payer: Self-pay | Admitting: Internal Medicine

## 2020-06-05 DIAGNOSIS — Z79899 Other long term (current) drug therapy: Secondary | ICD-10-CM

## 2020-06-05 DIAGNOSIS — R7309 Other abnormal glucose: Secondary | ICD-10-CM

## 2020-06-05 DIAGNOSIS — E782 Mixed hyperlipidemia: Secondary | ICD-10-CM

## 2020-06-05 DIAGNOSIS — I10 Essential (primary) hypertension: Secondary | ICD-10-CM

## 2020-06-13 ENCOUNTER — Other Ambulatory Visit: Payer: PPO

## 2020-06-13 ENCOUNTER — Other Ambulatory Visit: Payer: Self-pay

## 2020-06-13 DIAGNOSIS — R7309 Other abnormal glucose: Secondary | ICD-10-CM | POA: Diagnosis not present

## 2020-06-13 DIAGNOSIS — Z1159 Encounter for screening for other viral diseases: Secondary | ICD-10-CM | POA: Diagnosis not present

## 2020-06-13 DIAGNOSIS — E782 Mixed hyperlipidemia: Secondary | ICD-10-CM | POA: Diagnosis not present

## 2020-06-13 DIAGNOSIS — I10 Essential (primary) hypertension: Secondary | ICD-10-CM

## 2020-06-13 DIAGNOSIS — E790 Hyperuricemia without signs of inflammatory arthritis and tophaceous disease: Secondary | ICD-10-CM | POA: Diagnosis not present

## 2020-06-14 ENCOUNTER — Encounter: Payer: Self-pay | Admitting: Adult Health

## 2020-06-14 DIAGNOSIS — M109 Gout, unspecified: Secondary | ICD-10-CM | POA: Insufficient documentation

## 2020-06-14 LAB — CBC WITH DIFFERENTIAL/PLATELET
Absolute Monocytes: 711 cells/uL (ref 200–950)
Basophils Absolute: 71 cells/uL (ref 0–200)
Basophils Relative: 0.9 %
Eosinophils Absolute: 253 cells/uL (ref 15–500)
Eosinophils Relative: 3.2 %
HCT: 42.1 % (ref 38.5–50.0)
Hemoglobin: 13.5 g/dL (ref 13.2–17.1)
Lymphs Abs: 1904 cells/uL (ref 850–3900)
MCH: 29.3 pg (ref 27.0–33.0)
MCHC: 32.1 g/dL (ref 32.0–36.0)
MCV: 91.3 fL (ref 80.0–100.0)
MPV: 10.8 fL (ref 7.5–12.5)
Monocytes Relative: 9 %
Neutro Abs: 4961 cells/uL (ref 1500–7800)
Neutrophils Relative %: 62.8 %
Platelets: 427 10*3/uL — ABNORMAL HIGH (ref 140–400)
RBC: 4.61 10*6/uL (ref 4.20–5.80)
RDW: 13.5 % (ref 11.0–15.0)
Total Lymphocyte: 24.1 %
WBC: 7.9 10*3/uL (ref 3.8–10.8)

## 2020-06-14 LAB — COMPLETE METABOLIC PANEL WITH GFR
AG Ratio: 1.3 (calc) (ref 1.0–2.5)
ALT: 24 U/L (ref 9–46)
AST: 26 U/L (ref 10–35)
Albumin: 4.2 g/dL (ref 3.6–5.1)
Alkaline phosphatase (APISO): 74 U/L (ref 35–144)
BUN: 17 mg/dL (ref 7–25)
CO2: 28 mmol/L (ref 20–32)
Calcium: 9.5 mg/dL (ref 8.6–10.3)
Chloride: 100 mmol/L (ref 98–110)
Creat: 1.11 mg/dL (ref 0.70–1.25)
GFR, Est African American: 79 mL/min/{1.73_m2} (ref >=60)
GFR, Est Non African American: 68 mL/min/{1.73_m2} (ref >=60)
Globulin: 3.2 g/dL (calc) (ref 1.9–3.7)
Glucose, Bld: 97 mg/dL (ref 65–99)
Potassium: 3.9 mmol/L (ref 3.5–5.3)
Sodium: 138 mmol/L (ref 135–146)
Total Bilirubin: 0.5 mg/dL (ref 0.2–1.2)
Total Protein: 7.4 g/dL (ref 6.1–8.1)

## 2020-06-14 LAB — LIPID PANEL
Cholesterol: 173 mg/dL (ref <200)
HDL: 35 mg/dL — ABNORMAL LOW (ref >=40)
LDL Cholesterol (Calc): 113 mg/dL (calc) — ABNORMAL HIGH
Non-HDL Cholesterol (Calc): 138 mg/dL (calc) — ABNORMAL HIGH (ref <130)
Total CHOL/HDL Ratio: 4.9 (calc) (ref <5.0)
Triglycerides: 135 mg/dL (ref <150)

## 2020-06-14 LAB — HEMOGLOBIN A1C
Hgb A1c MFr Bld: 6.1 % of total Hgb — ABNORMAL HIGH (ref <5.7)
Mean Plasma Glucose: 128 (calc)
eAG (mmol/L): 7.1 (calc)

## 2020-06-14 LAB — HEPATITIS C ANTIBODY
Hepatitis C Ab: NONREACTIVE
SIGNAL TO CUT-OFF: 0.02 (ref <1.00)

## 2020-06-14 LAB — URIC ACID: Uric Acid, Serum: 8.5 mg/dL — ABNORMAL HIGH (ref 4.0–8.0)

## 2020-06-14 NOTE — Progress Notes (Signed)
========================================================== -   Test results slightly outside the reference range are not unusual. If there is anything important, I will review this with you,  otherwise it is considered normal test values.  If you have further questions,  please do not hesitate to contact me at the office or via My Chart.  ==========================================================  -  CMET Normal & OK  - specifically Potassium is OK

## 2020-07-06 ENCOUNTER — Encounter: Payer: Self-pay | Admitting: Internal Medicine

## 2020-07-27 DIAGNOSIS — H43811 Vitreous degeneration, right eye: Secondary | ICD-10-CM | POA: Diagnosis not present

## 2020-07-27 DIAGNOSIS — H52201 Unspecified astigmatism, right eye: Secondary | ICD-10-CM | POA: Diagnosis not present

## 2020-07-27 DIAGNOSIS — Z961 Presence of intraocular lens: Secondary | ICD-10-CM | POA: Diagnosis not present

## 2020-07-27 DIAGNOSIS — H31002 Unspecified chorioretinal scars, left eye: Secondary | ICD-10-CM | POA: Diagnosis not present

## 2020-08-10 ENCOUNTER — Encounter: Payer: Self-pay | Admitting: Internal Medicine

## 2020-08-10 NOTE — Patient Instructions (Signed)

## 2020-08-10 NOTE — Progress Notes (Signed)
Annual  Screening/Preventative Visit  & Comprehensive Evaluation & Examination      This very nice 69 y.o.  DWM presents for a Screening /Preventative Visit & comprehensive evaluation and management of multiple medical co-morbidities.  Patient has been followed for HTN, HLD, T2_NIDDM  and Vitamin D Deficiency.  Patient  relates prior hx of diverticulitis in descending & transverse colon and in Jan 2019 a mild case of Pancreatitis     HTN predates since 2009. Patient's BP has been controlled at home.  Today's BP: 122/82.  Patient has hx/o pAfib predating from May 2020 [facility]).   Patient denies any cardiac symptoms as chest pain, palpitations, shortness of breath, dizziness or ankle swelling.      Patient's hyperlipidemia is controlled with diet and medications. Patient denies myalgias or other medication SE's. Last lipids were not at goal:  Lab Results  Component Value Date   CHOL 173 06/13/2020   HDL 35 (L) 06/13/2020   LDLCALC 113 (H) 06/13/2020   TRIG 135 06/13/2020   CHOLHDL 4.9 06/13/2020        Patient has hx/o prediabetes since    and patient denies reactive hypoglycemic symptoms, visual blurring, diabetic polys or paresthesias. Last A1c was   Lab Results  Component Value Date   HGBA1C 6.1 (H) 06/13/2020         Finally, patient has history of Vitamin D Deficiency of    and last vitamin D was   Lab Results  Component Value Date   VD25OH 52 11/30/2019    Current Outpatient Medications on File Prior to Visit  Medication Sig  . allopurinol 300 MG tab Take 1 tablet Daily  . VITAMIN C 1000 MG tab Takes Daily   . b complex vitamins tablet Take 1 tablet  daily.  Marland Kitchen VITAMIN D 10,000 units Take daily.  . colchicine 0.6 MG tablet Take 1 tablet 2 x /day for Acute Gout  . HM OMEGA-3  FATTY   ACIDS  Takes Daily   . hctz25 MG tab Take 1 tablet Daily  . LECITHIN PO Take 1 tablet  daily.   Marland Kitchen RESVERATROL  Take 1 capsule  daily.  . vitamin E 1000 UNIT caps Take 1 capsule  daily.  Marland Kitchen zinc 50 MG tab Takes 1 tablet  daily. )   No Known Allergies  Past Medical History:  Diagnosis Date  . Cataract 2008   os  . Cataract 2017   od  . Degenerative joint disease (DJD) of hip   . Diverticulitis large intestine   . History of acute pancreatitis 01/15/2018  . History of diverticulitis 01/15/2018  . Hx of vitrectomy Sept 2010 Left eye  02/22/2015   Retinal detachment surgery per Deloria Lair   . Hyperlipidemia 11/2017   LDL 108  . Hypertension 2009   started HCTZ & switched to Norvasc in Jan 2019  . Pancreatitis 09/2017  . Retinal detachment of left eye with single retinal tear 2010   Vitrectomy - Dr [name]   Health Maintenance  Topic Date Due  . INFLUENZA VACCINE  Never done  . TETANUS/TDAP  03/09/2021 (Originally 09/30/2013)  . COLONOSCOPY  12/09/2024  . COVID-19 Vaccine  Completed  . Hepatitis C Screening  Completed  . PNA vac Low Risk Adult  Discontinued   Immunization History  Administered Date(s) Administered  . PFIZER SARS-COV-2 Vaccination 09/19/2019, 10/07/2019   Last Colon - 12/10/2019 - [facility] - Recommend 5 yr f/u   Past Surgical History:  Procedure Laterality  Date  . EUS N/A 02/04/2018   Procedure: UPPER ENDOSCOPIC ULTRASOUND (EUS) RADIAL;  Surgeon: Arta Silence, MD;  Location: [facility];  Service: Endoscopy;  Laterality: N/A;  . EYE SURGERY Bilateral    Left 08/2007, R 02/02/2013  . TOTAL HIP ARTHROPLASTY Right 02/19/2019   Procedure: RIGHT TOTAL HIP ARTHROPLASTY ANTERIOR APPROACH;  Surgeon: Mcarthur Rossetti, MD;  Location: [facility];  Service: Orthopedics;  Laterality: Right;   Family History  Problem Relation Age of Onset  . Stroke Mother   . Cancer Mother   . Cancer Father   . Heart disease Brother   . Cancer Brother   . Cancer Sister    Social History   Socioeconomic History  . Marital status: Divorced  . Number of children: 3  . Highest education level: Physician   Occupational History  . surgeon  Tobacco Use  .  Smoking status: Never Smoker  . Smokeless tobacco: Never Used  Vaping Use  . Vaping Use: Never used  Substance and Sexual Activity  . Alcohol use: Yes    Comment: infrequent  . Drug use: Never  . Sexual activity: Yes    ROS Constitutional: Denies fever, chills, weight loss/gain, headaches, insomnia,  night sweats or change in appetite. Does c/o fatigue. Eyes: Denies redness, blurred vision, diplopia, discharge, itchy or watery eyes.  ENT: Denies discharge, congestion, post nasal drip, epistaxis, sore throat, earache, hearing loss, dental pain, Tinnitus, Vertigo, Sinus pain or snoring.  Cardio: Denies chest pain, palpitations, irregular heartbeat, syncope, dyspnea, diaphoresis, orthopnea, PND, claudication or edema Respiratory: denies cough, dyspnea, DOE, pleurisy, hoarseness, laryngitis or wheezing.  Gastrointestinal: Denies dysphagia, heartburn, reflux, water brash, pain, cramps, nausea, vomiting, bloating, diarrhea, constipation, hematemesis, melena, hematochezia, jaundice or hemorrhoids Genitourinary: Denies dysuria, frequency, urgency, nocturia, hesitancy, discharge, hematuria or flank pain Musculoskeletal: Denies arthralgia, myalgia, stiffness, Jt. Swelling, pain, limp or strain/sprain. Denies Falls. Skin: Denies puritis, rash, hives, warts, acne, eczema or change in skin lesion Neuro: No weakness, tremor, incoordination, spasms, paresthesia or pain Psychiatric: Denies confusion, memory loss or sensory loss. Denies Depression. Endocrine: Denies change in weight, skin, hair change, nocturia, and paresthesia, diabetic polys, visual blurring or hyper / hypo glycemic episodes.  Heme/Lymph: No excessive bleeding, bruising or enlarged lymph nodes.  Physical Exam  BP 122/82   Temp 97.6 F (36.4 C)   Ht 6' 1.5" (1.867 m)   Wt 265 lb (120.2 kg)   BMI 34.49 kg/m   General Appearance: over nourished and well groomed and in no apparent distress.  Eyes: PERRLA, EOMs, conjunctiva no  swelling or erythema, normal fundi and vessels. Sinuses: No frontal/maxillary tenderness ENT/Mouth: EACs patent / TMs  nl. Nares clear without erythema, swelling, mucoid exudates. Oral hygiene is good. No erythema, swelling, or exudate. Tongue normal, non-obstructing. Tonsils not swollen or erythematous. Hearing normal.  Neck: Supple, thyroid not palpable. No bruits, nodes or JVD. Respiratory: Respiratory effort normal.  BS equal and clear bilateral without rales, rhonci, wheezing or stridor. Cardio: Heart sounds are normal with regular rate and rhythm and no murmurs, rubs or gallops. Peripheral pulses are normal and equal bilaterally without edema. No aortic or femoral bruits. Chest: symmetric with normal excursions and percussion.  Abdomen: Soft, with Nl bowel sounds. Nontender, no guarding, rebound, hernias, masses, or organomegaly.  Lymphatics: Non tender without lymphadenopathy.  Musculoskeletal: Full ROM all peripheral extremities, joint stability, 5/5 strength, and normal gait. Skin: Warm and dry without rashes, lesions, cyanosis, clubbing or  ecchymosis.  Neuro: Cranial nerves intact, reflexes  equal bilaterally. Normal muscle tone, no cerebellar symptoms. Sensation intact.  Pysch: Alert and oriented X 3 with normal affect, insight and judgment appropriate.   Assessment and Plan  1. Annual Preventative/Screening Exam   2. Essential hypertension  - EKG 12-Lead - [place], RETROPERITNL ABD,  LTD - Urinalysis, Routine w reflex microscopic - Microalbumin / creatinine urine ratio  3. Hyperlipidemia, mixed  - EKG 12-Lead - [place], RETROPERITNL ABD,  LTD  4. Abnormal glucose  - EKG 12-Lead - [place], RETROPERITNL ABD,  LTD  5. Vitamin D deficiency  - VITAMIN D 25 Hydroxyl  6. Morbid obesity ([place]) - BMI 30+ with OSA   7. OSA on CPAP   8. Idiopathic gout  - Uric acid  9. BPH with obstruction/lower urinary tract symptoms  - PSA  10. Prostate cancer screening  - PSA  11.  Screening for ischemic heart disease  - EKG 12-Lead - [place], RETROPERITNL ABD,  LTD  12. Screening for AAA (aortic abdominal aneurysm)  - EKG 12-Lead - [place], RETROPERITNL ABD,  LTD  13. Screening for colorectal cancer - POC Hemoccult Bld/Stl  14. Medication management  - Urinalysis, Routine w reflex microscopic - Microalbumin / creatinine urine ratio - VITAMIN D 25 Hydroxy         Patient was counseled in prudent diet, weight control to achieve/maintain BMI less than 25, BP monitoring, regular exercise and medications as discussed.  Discussed med effects and SE's. Routine screening labs and tests as requested with regular follow-up as recommended. Over 40 minutes of exam, counseling, chart review and high complex critical decision making was performed   Kirtland Bouchard, MD

## 2020-08-11 ENCOUNTER — Other Ambulatory Visit: Payer: Self-pay

## 2020-08-11 ENCOUNTER — Encounter: Payer: Self-pay | Admitting: Internal Medicine

## 2020-08-11 ENCOUNTER — Ambulatory Visit (INDEPENDENT_AMBULATORY_CARE_PROVIDER_SITE_OTHER): Payer: PPO | Admitting: Internal Medicine

## 2020-08-11 VITALS — BP 122/82 | Temp 97.6°F | Ht 73.5 in | Wt 265.0 lb

## 2020-08-11 DIAGNOSIS — Z0000 Encounter for general adult medical examination without abnormal findings: Secondary | ICD-10-CM

## 2020-08-11 DIAGNOSIS — I10 Essential (primary) hypertension: Secondary | ICD-10-CM

## 2020-08-11 DIAGNOSIS — Z0001 Encounter for general adult medical examination with abnormal findings: Secondary | ICD-10-CM

## 2020-08-11 DIAGNOSIS — Z79899 Other long term (current) drug therapy: Secondary | ICD-10-CM

## 2020-08-11 DIAGNOSIS — Z1212 Encounter for screening for malignant neoplasm of rectum: Secondary | ICD-10-CM

## 2020-08-11 DIAGNOSIS — N401 Enlarged prostate with lower urinary tract symptoms: Secondary | ICD-10-CM | POA: Diagnosis not present

## 2020-08-11 DIAGNOSIS — R7309 Other abnormal glucose: Secondary | ICD-10-CM

## 2020-08-11 DIAGNOSIS — Z1211 Encounter for screening for malignant neoplasm of colon: Secondary | ICD-10-CM

## 2020-08-11 DIAGNOSIS — E559 Vitamin D deficiency, unspecified: Secondary | ICD-10-CM | POA: Diagnosis not present

## 2020-08-11 DIAGNOSIS — E782 Mixed hyperlipidemia: Secondary | ICD-10-CM

## 2020-08-11 DIAGNOSIS — Z125 Encounter for screening for malignant neoplasm of prostate: Secondary | ICD-10-CM

## 2020-08-11 DIAGNOSIS — M1000 Idiopathic gout, unspecified site: Secondary | ICD-10-CM

## 2020-08-11 DIAGNOSIS — N138 Other obstructive and reflux uropathy: Secondary | ICD-10-CM | POA: Diagnosis not present

## 2020-08-11 DIAGNOSIS — G4733 Obstructive sleep apnea (adult) (pediatric): Secondary | ICD-10-CM

## 2020-08-11 DIAGNOSIS — Z136 Encounter for screening for cardiovascular disorders: Secondary | ICD-10-CM | POA: Diagnosis not present

## 2020-08-12 ENCOUNTER — Encounter: Payer: Self-pay | Admitting: Internal Medicine

## 2020-08-12 LAB — URINALYSIS, ROUTINE W REFLEX MICROSCOPIC
Bilirubin Urine: NEGATIVE
Glucose, UA: NEGATIVE
Hgb urine dipstick: NEGATIVE
Ketones, ur: NEGATIVE
Leukocytes,Ua: NEGATIVE
Nitrite: NEGATIVE
Protein, ur: NEGATIVE
Specific Gravity, Urine: 1.019 (ref 1.001–1.03)
pH: 5.5 (ref 5.0–8.0)

## 2020-08-12 LAB — URIC ACID: Uric Acid, Serum: 8.5 mg/dL — ABNORMAL HIGH (ref 4.0–8.0)

## 2020-08-12 LAB — MICROALBUMIN / CREATININE URINE RATIO
Creatinine, Urine: 151 mg/dL (ref 20–320)
Microalb Creat Ratio: 7 mcg/mg creat (ref <30)
Microalb, Ur: 1.1 mg/dL

## 2020-08-12 LAB — PSA: PSA: 0.74 ng/mL (ref <=4.0)

## 2020-08-12 LAB — VITAMIN D 25 HYDROXY (VIT D DEFICIENCY, FRACTURES): Vit D, 25-Hydroxy: 51 ng/mL (ref 30–100)

## 2020-08-12 MED ORDER — ASPIRIN EC 81 MG PO TBEC: DELAYED_RELEASE_TABLET | ORAL | Status: DC

## 2020-08-12 MED ORDER — AMLODIPINE BESYLATE 5 MG PO TABS: ORAL_TABLET | ORAL | 0 refills | Status: DC

## 2020-08-12 NOTE — Progress Notes (Signed)
========================================================== -   Test results slightly outside the reference range are not unusual. If there is anything important, I will review this with you,  otherwise it is considered normal test values.  If you have further questions,  please do not hesitate to contact me at the office or via My Chart.  ========================================================== ==========================================================  -  U/A & MAlbu - Normal & Negative - No Sign of Kidney damage ==========================================================  -  PSA - Low - Great  ==========================================================  -  Vitamin D = 51 is OK   - Vitamin D goal is between 70-100 , So a little higher would be Better  - Could add an extra 2,000 units /day  or and extra 5,000 every other day  or 3 x /week - MWF  - It is very important as a natural anti-inflammatory and helping the  immune system protect against viral infections, like the Covid-19    helping hair, skin, and nails, as well as reducing stroke and  heart attack risk.   - It helps your bones and helps with mood.  - It also decreases numerous cancer risks   - Low Vit D is associated with a 200-300% higher risk for  CANCER   and 200-300% higher risk for [place].    - It is also associated with higher death rate at younger ages,   autoimmune diseases like Rheumatoid arthritis, Lupus,  Multiple Sclerosis.     - Also many other serious conditions, like depression, Alzheimer's  Dementia, infertility, muscle aches, fatigue, fibromyalgia   - just to name a few. ==========================================================  - Uric Acid = 8.5 - slightly elevated  ( Goal with hx/o  Gout is < 6.0  !  )   - So please be sure taking your Allopurinol and  avoid Red Meat - Beef &  Pork ========================================================== ==========================================================

## 2020-08-21 ENCOUNTER — Other Ambulatory Visit: Payer: Self-pay | Admitting: Internal Medicine

## 2020-08-21 DIAGNOSIS — I10 Essential (primary) hypertension: Secondary | ICD-10-CM

## 2020-10-19 ENCOUNTER — Other Ambulatory Visit: Payer: Self-pay | Admitting: Internal Medicine

## 2020-10-19 MED ORDER — AZITHROMYCIN 250 MG PO TABS: ORAL_TABLET | ORAL | 1 refills | Status: DC

## 2020-11-16 ENCOUNTER — Other Ambulatory Visit: Payer: Self-pay | Admitting: Adult Health

## 2020-11-16 ENCOUNTER — Other Ambulatory Visit: Payer: Self-pay

## 2020-11-16 ENCOUNTER — Encounter: Payer: Self-pay | Admitting: Adult Health

## 2020-11-16 ENCOUNTER — Ambulatory Visit (INDEPENDENT_AMBULATORY_CARE_PROVIDER_SITE_OTHER): Payer: PPO | Admitting: Adult Health

## 2020-11-16 VITALS — BP 160/90 | HR 76 | Temp 97.3°F | Wt 274.0 lb

## 2020-11-16 DIAGNOSIS — I4891 Unspecified atrial fibrillation: Secondary | ICD-10-CM

## 2020-11-16 DIAGNOSIS — E559 Vitamin D deficiency, unspecified: Secondary | ICD-10-CM

## 2020-11-16 DIAGNOSIS — E782 Mixed hyperlipidemia: Secondary | ICD-10-CM

## 2020-11-16 DIAGNOSIS — Z9989 Dependence on other enabling machines and devices: Secondary | ICD-10-CM

## 2020-11-16 DIAGNOSIS — R6889 Other general symptoms and signs: Secondary | ICD-10-CM | POA: Diagnosis not present

## 2020-11-16 DIAGNOSIS — M1000 Idiopathic gout, unspecified site: Secondary | ICD-10-CM

## 2020-11-16 DIAGNOSIS — K5730 Diverticulosis of large intestine without perforation or abscess without bleeding: Secondary | ICD-10-CM

## 2020-11-16 DIAGNOSIS — Z0000 Encounter for general adult medical examination without abnormal findings: Secondary | ICD-10-CM

## 2020-11-16 DIAGNOSIS — G4733 Obstructive sleep apnea (adult) (pediatric): Secondary | ICD-10-CM

## 2020-11-16 DIAGNOSIS — R7309 Other abnormal glucose: Secondary | ICD-10-CM

## 2020-11-16 DIAGNOSIS — Z0001 Encounter for general adult medical examination with abnormal findings: Secondary | ICD-10-CM

## 2020-11-16 DIAGNOSIS — I10 Essential (primary) hypertension: Secondary | ICD-10-CM

## 2020-11-16 MED ORDER — HYDROCHLOROTHIAZIDE 25 MG PO TABS: ORAL_TABLET | ORAL | 1 refills | Status: DC

## 2020-11-16 NOTE — Progress Notes (Signed)
MEDICARE ANNUAL WELLNESS VISIT AND FOLLOW UP Assessment:   Son was seen today for medicare wellness.  Diagnoses and all orders for this visit:  Encounter for Medicare annual wellness exam He declines tetanus booster, pneumonia vaccines, shingrix  Getting flu vaccine annually via work  Essential hypertension Elevated in office; restart amlodipine 5 mg for goal; recheck NV in 2 weeks with labs Monitor blood pressure at home; call if consistently over 140/80 Continue DASH diet.   Reminder to go to the ER if any CP, SOB, nausea, dizziness, severe HA, changes vision/speech, left arm numbness and tingling and jaw pain.  Sigmoid diverticulosis Continue lifestyle modification; consider adding daily fiber supplement  Other abnormal glucose (prediabetes) Discussed disease and risks Discussed diet/exercise, weight management  Monitor A1C q21m  Morbid obesity - BMI 30+ with OSA Long discussion about weight loss, diet, and exercise Recommended diet heavy in fruits and veggies and low in animal meats, cheeses, and dairy products, appropriate calorie intake Discussed appropriate weight for height  Follow up at next visit  OSA Encouraged CPAP compliance, weight loss encouraged  Hyperlipidemia, unspecified hyperlipidemia type Mild elevations not requiring statin therapy; lifestyle only at this time Continue low cholesterol diet and exercise.  Monitor routinely; defer today as had <90 days ago   Vitamin D deficiency Continue supplementation for goal of 60-100 Defer vitamin D level  Gout Continue allopurinol Diet discussed Check uric acid   A. Fib with controlled ventricular response (HCC) Rediscussed chadsvasc score of 2; 2.9% risk of clot/CVA, would recommend anticoagulation, Dr. 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 Understands this is  not proven to reduce risk of a. Fib related clots patient to go to ER if there is weakness, thunderclap headache, visual changes, or any concerning factors Continue to monitor;   He requests defer labs today; future orders placed; he will call back to get these as fasting labs.   Over 30 minutes of exam, counseling, chart review, and critical decision making was performed  Future Appointments  Date Time Provider [place]  02/15/2021  4:30 PM [name], Harold Davis [facility] None  08/28/2021  2:00 PM [name], Harold Davis [facility] None  11/19/2021  3:30 PM [name], NP [facility] None     Plan:   During the course of the visit the patient was educated and counseled about appropriate screening and preventive services including:    Pneumococcal vaccine   Influenza vaccine  Prevnar 13  Td vaccine  Screening electrocardiogram  Colorectal cancer screening  Diabetes screening  Glaucoma screening  Nutrition counseling    Subjective:  Harold Earls, Harold Davis is a 70 y.o. male who presents for Medicare Annual Wellness Visit and 3 month follow up for HTN, hyperlipidemia, prediabetes, and vitamin D Def.   He is a Education officer, environmental for the [employer];    He was found to be in asymptomatic persistent a. Fib at preop screening for hip replacement, ECHO 02/15/2019 showed Persistent Afib, Normal Heart Valves, Normal Ejection Fraction (60-65%), mild -moderate Atrial dilation consequent of the Afib and was referred to cardiology, saw Dr. Caryl Comes who transitioned to apixoban 5 mg BID (Cha2ds2-vasc of 2), recommended DCCV; Patient preferred to avoid NOAC.coumadin (citing numerous complications with patients personally), and after considered risk of CVA (2.9% per chadsvasc score), has decided wouldn't pursue aggressive treatment either way. Declines follow up with cardiology, would not pursue  DCCV, declines NOAC/coumadin. Taking bASA. Tracking with apple watch.   Sleep study by Dr.  Radford Pax 03/2019 found mod/severe OSA and was initiated on CPAP, was not using consistently, finds very cumbersome and hasn't been wearing, discussed referral to Dr. Brett Fairy but declines at this time. Has been sleeping on his side.   With hx/o Colon Ca in both parents; had colonoscopy 12/10/2019 Dr Cristina Gong for colonoscopy which showed 1 polyp and widespread colonic diverticuloses.   BMI is Body mass index is 35.66 kg/m., he has been working on diet and exercise. 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 Admits consistently eating too much carbs. Would like to get to 245 lb.  Wt Readings from Last 3 Encounters:  11/16/20 274 lb (124.3 kg)  08/11/20 265 lb (120.2 kg)  03/09/20 273 lb (123.8 kg)   His blood pressure has been controlled at home (130/80s) by HCTZ 25 mg daily, amlodipine 5 mg PRN- hasn't been taking,  (130s/80s), today their BP is BP: (!) 160/90 He does workout. He denies chest pain, shortness of breath, dizziness.   He is not on cholesterol medication and denies myalgias. His cholesterol is not at goal, mild LDL elevation. The cholesterol last visit was:   Lab Results  Component Value Date   CHOL 173 06/13/2020   HDL 35 (L) 06/13/2020   LDLCALC 113 (H) 06/13/2020   TRIG 135 06/13/2020   CHOLHDL 4.9 06/13/2020    He has been working on diet and exercise for prediabetes, and denies increased appetite, nausea, paresthesia of the feet, polydipsia, polyuria and visual disturbances.  Had A1C checked at work on 12/22/2019, was 5.7%  Last A1C in the office was:  Lab Results  Component Value Date   HGBA1C 6.1 (H) 06/13/2020   Last GFR Lab Results  Component Value Date   GFRNONAA 68 06/13/2020   Patient is on Vitamin D supplement, taking 10000 IU daily:  Lab Results  Component Value Date   VD25OH 51 08/11/2020     Patient is on allopurinol for gout and does report a recent flare with  covid fall 2021.   Lab Results  Component Value Date   LABURIC 8.5 (H) 08/11/2020     Medication Review:    Current Outpatient Medications (Cardiovascular):  .  amLODipine (NORVASC) 5 MG tablet, Takes      1 tablet      Daily        for BP (Patient not taking: Reported on 11/16/2020) .  hydrochlorothiazide (HYDRODIURIL) 25 MG tablet, Take 1 tablet daily for BP & Fluid Retention   Current Outpatient Medications (Analgesics):  .  allopurinol (ZYLOPRIM) 300 MG tablet, Take 1 tablet Daily to Prevent Gout .  aspirin EC 81 MG tablet, Takes 1 tablet Daily .  colchicine 0.6 MG tablet, Take 1 tablet 2 x /day for Acute Gout (Patient taking differently: as needed. Take 1 tablet 2 x /day for Acute Gout)   Current Outpatient Medications (Other):  Marland Kitchen  Ascorbic Acid (VITAMIN C) 1000 MG tablet, Takes Daily (Patient taking differently: Take 1,000 mg by mouth daily.) .  b complex vitamins tablet, Take 1 tablet by mouth daily. .  Cholecalciferol (VITAMIN D3) 10000 units TABS, Take 10,000 Units by mouth daily. Marland Kitchen  HM OMEGA-3-6-9 FATTY ACIDS CAPS, Takes Daily (Patient taking differently: Take 1 Package by mouth daily.) .  LECITHIN PO, Take 1 tablet by mouth daily.  Marland Kitchen  Misc Natural Products (RESVERATROL DIET PO), Take 1 capsule by mouth daily. .  vitamin E 1000 UNIT capsule, Take 1 capsule (1,000 Units total) by mouth daily. Marland Kitchen  zinc gluconate 50 MG tablet, Takes 1 tablet every other day (Patient taking differently: Take 50 mg by mouth daily.)  Allergies: No Known Allergies  Current Problems (verified) has Essential hypertension; Sigmoid diverticulosis; Abnormal glucose (prediabetes); Morbid obesity ([place]) - BMI 30+ with OSA; Hyperlipidemia, mixed; Vitamin D deficiency; Atrial fibrillation with controlled ventricular response ([place]); OSA on CPAP; and Gout on their problem list.  Screening Tests Immunization History  Administered Date(s) Administered  . Influenza-Unspecified 06/30/2020  . PFIZER(Purple  Top)SARS-COV-2 Vaccination 09/19/2019, 10/07/2019    Preventative care: Last colonoscopy: last 11/2019, getting every 5 years by Dr. Cristina Gong, diverticulosis and 1 polyp (benign)  Prior vaccinations: TD or Tdap: 2005 declines   Influenza: 2020 via hospital   Pneumococcal: declines  Prevnar13: declines Shingles/Zostavax: declines shingrix  Covid 19: 2/2, 2021 [place] of Other Physician/Practitioners you currently use: 1. [facility] here for primary care 2. Dr. [name], eye doctor, last visit 2021, planning PRN follow up 3. Dr. Redmond Pulling retired, dentist, last visit 2019, needs new provider  Patient Care Team: [name], Harold Davis as PCP - General (Internal Medicine)  Surgical: He  has a past surgical history that includes EUS (N/A, 02/04/2018); Eye surgery (Bilateral); and Total hip arthroplasty (Right, 02/19/2019). Family His family history includes Cancer in his brother, father, mother, and sister; Heart disease in his brother; Stroke in his mother. Social history  He reports that he has never smoked. He has never used smokeless tobacco. He reports current alcohol use. He reports that he does not use drugs.  MEDICARE WELLNESS OBJECTIVES: Physical activity: Current Exercise Habits: Home exercise routine, Type of exercise: Other - see comments (bicycle), Time (Minutes): 30, Frequency (Times/Week): 3, Weekly Exercise (Minutes/Week): 90, Intensity: Mild, Exercise limited by: None identified Cardiac risk factors: Cardiac Risk Factors include: advanced age (>80men, >54 women);dyslipidemia;hypertension;male gender;obesity (BMI >30kg/m2) Depression/mood screen:   Depression screen [facility] 2/9 11/16/2020  Decreased Interest 0  Down, Depressed, Hopeless 0  PHQ - 2 Score 0    ADLs:  In your present state of health, do you have any difficulty performing the following activities: 11/16/2020 08/10/2020  Hearing? N N  Vision? N N  Difficulty concentrating or  making decisions? N N  Walking or climbing stairs? N N  Dressing or bathing? N N  Doing errands, shopping? N N  Some recent data might be hidden     Cognitive Testing  Alert? Yes  Normal Appearance?Yes  Oriented to person? Yes  Place? Yes   Time? Yes  Recall of three objects?  Yes  Can perform simple calculations? Yes  Displays appropriate judgment?Yes  Can read the correct time from a watch face?Yes  EOL planning: Does Patient Have a Medical Advance Directive?: Yes Type of Advance Directive: [place] will Does patient want to make changes to medical advance directive?: No - Patient declined Copy of [place] in Chart?: Yes - validated most recent copy scanned in chart (See row information)   Objective:   Today's Vitals   11/16/20 1541  BP: (!) 160/90  Pulse: 76  Temp: (!) 97.3 F (36.3 C)  SpO2: 95%  Weight: 274 lb (124.3 kg)   Body mass index is 35.66 kg/m.  Eyes: PERRLA, EOMs, conjunctiva no swelling or erythema Sinuses: No frontal/maxillary tenderness ENT/Mouth: EACs  patent / TMs  nl. Nares clear without erythema, swelling, mucoid exudates. Oral hygiene is good. No erythema, swelling, or exudate. Tongue normal, non-obstructing. Tonsils not swollen or erythematous. Hearing normal.  Neck: Supple, thyroid not palpable. No bruits, nodes or JVD. Respiratory: Respiratory effort normal.  BS equal and clear bilateral without rales, rhonci, wheezing or stridor. Cardio: Heart sounds irregularly irregular and no murmurs. Peripheral pulses are normal and equal bilaterally without edema/ PP 2-3(+) bilateral.   Chest: symmetric with normal excursions and percussion.  Abdomen: Soft, with Nl bowel sounds. Nontender, no guarding, rebound, hernias, masses, or organomegaly.  Lymphatics: Non tender without lymphadenopathy.  Musculoskeletal: Full ROM all peripheral extremities, joint stability, 5/5 strength, and normal gait. Skin: Warm and  dry without rashes, lesions, cyanosis, clubbing or  ecchymosis.  Neuro: Cranial nerves intact, reflexes equal bilaterally. Normal muscle tone, no cerebellar symptoms. Sensation intact/  Pysch: Alert and oriented X 3 with normal affect, insight and judgment appropriate.   Medicare Attestation I have personally reviewed: The patient's medical and social history Their use of alcohol, tobacco or illicit drugs Their current medications and supplements The patient's functional ability including ADLs,fall risks, home safety risks, cognitive, and hearing and visual impairment Diet and physical activities Evidence for depression or mood disorders  The patient's weight, height, BMI, and visual acuity have been recorded in the chart.  I have made referrals, counseling, and provided education to the patient based on review of the above and I have provided the patient with a written personalized care plan for preventive services.     Izora Ribas, NP   11/16/2020

## 2020-11-24 ENCOUNTER — Ambulatory Visit (INDEPENDENT_AMBULATORY_CARE_PROVIDER_SITE_OTHER): Payer: PPO

## 2020-11-24 ENCOUNTER — Other Ambulatory Visit: Payer: Self-pay

## 2020-11-24 DIAGNOSIS — I10 Essential (primary) hypertension: Secondary | ICD-10-CM | POA: Diagnosis not present

## 2020-11-24 DIAGNOSIS — R7309 Other abnormal glucose: Secondary | ICD-10-CM | POA: Diagnosis not present

## 2020-11-24 DIAGNOSIS — M1000 Idiopathic gout, unspecified site: Secondary | ICD-10-CM | POA: Diagnosis not present

## 2020-11-24 DIAGNOSIS — E782 Mixed hyperlipidemia: Secondary | ICD-10-CM | POA: Diagnosis not present

## 2020-11-24 NOTE — Progress Notes (Signed)
Patient presents to the office for a nurse visit for labs and a BP check. Today's reading was 140/82. States that he is taking Norvasc again along with HCTZ. No questions or concerns. Vitals taken and recorded.

## 2020-11-25 LAB — COMPLETE METABOLIC PANEL WITH GFR
AG Ratio: 1.5 (calc) (ref 1.0–2.5)
ALT: 23 U/L (ref 9–46)
AST: 24 U/L (ref 10–35)
Albumin: 4.5 g/dL (ref 3.6–5.1)
Alkaline phosphatase (APISO): 67 U/L (ref 35–144)
BUN: 19 mg/dL (ref 7–25)
CO2: 27 mmol/L (ref 20–32)
Calcium: 9.6 mg/dL (ref 8.6–10.3)
Chloride: 103 mmol/L (ref 98–110)
Creat: 1.02 mg/dL (ref 0.70–1.25)
GFR, Est African American: 88 mL/min/{1.73_m2} (ref >=60)
GFR, Est Non African American: 76 mL/min/{1.73_m2} (ref >=60)
Globulin: 3.1 g/dL (calc) (ref 1.9–3.7)
Glucose, Bld: 101 mg/dL — ABNORMAL HIGH (ref 65–99)
Potassium: 3.7 mmol/L (ref 3.5–5.3)
Sodium: 142 mmol/L (ref 135–146)
Total Bilirubin: 0.6 mg/dL (ref 0.2–1.2)
Total Protein: 7.6 g/dL (ref 6.1–8.1)

## 2020-11-25 LAB — CBC WITH DIFFERENTIAL/PLATELET
Absolute Monocytes: 602 cells/uL (ref 200–950)
Basophils Absolute: 89 cells/uL (ref 0–200)
Basophils Relative: 1.5 %
Eosinophils Absolute: 242 cells/uL (ref 15–500)
Eosinophils Relative: 4.1 %
HCT: 44.4 % (ref 38.5–50.0)
Hemoglobin: 15.1 g/dL (ref 13.2–17.1)
Lymphs Abs: 1375 cells/uL (ref 850–3900)
MCH: 31.6 pg (ref 27.0–33.0)
MCHC: 34 g/dL (ref 32.0–36.0)
MCV: 92.9 fL (ref 80.0–100.0)
MPV: 12 fL (ref 7.5–12.5)
Monocytes Relative: 10.2 %
Neutro Abs: 3593 cells/uL (ref 1500–7800)
Neutrophils Relative %: 60.9 %
Platelets: 221 10*3/uL (ref 140–400)
RBC: 4.78 10*6/uL (ref 4.20–5.80)
RDW: 13.5 % (ref 11.0–15.0)
Total Lymphocyte: 23.3 %
WBC: 5.9 10*3/uL (ref 3.8–10.8)

## 2020-11-25 LAB — LIPID PANEL
Cholesterol: 166 mg/dL (ref <200)
HDL: 40 mg/dL (ref >=40)
LDL Cholesterol (Calc): 110 mg/dL (calc) — ABNORMAL HIGH
Non-HDL Cholesterol (Calc): 126 mg/dL (calc) (ref <130)
Total CHOL/HDL Ratio: 4.2 (calc) (ref <5.0)
Triglycerides: 74 mg/dL (ref <150)

## 2020-11-25 LAB — MAGNESIUM: Magnesium: 2.1 mg/dL (ref 1.5–2.5)

## 2020-11-25 LAB — TSH: TSH: 1.79 mIU/L (ref 0.40–4.50)

## 2020-11-25 LAB — URIC ACID: Uric Acid, Serum: 5.9 mg/dL (ref 4.0–8.0)

## 2020-11-25 LAB — HEMOGLOBIN A1C
Hgb A1c MFr Bld: 5.9 % of total Hgb — ABNORMAL HIGH (ref <5.7)
Mean Plasma Glucose: 123 mg/dL
eAG (mmol/L): 6.8 mmol/L

## 2020-11-27 ENCOUNTER — Other Ambulatory Visit: Payer: Self-pay | Admitting: Internal Medicine

## 2020-11-27 DIAGNOSIS — I10 Essential (primary) hypertension: Secondary | ICD-10-CM

## 2020-11-27 MED ORDER — HYDROCHLOROTHIAZIDE 25 MG PO TABS: ORAL_TABLET | ORAL | 1 refills | Status: DC

## 2020-12-21 ENCOUNTER — Other Ambulatory Visit: Payer: Self-pay | Admitting: Adult Health

## 2020-12-21 DIAGNOSIS — R197 Diarrhea, unspecified: Secondary | ICD-10-CM

## 2020-12-22 ENCOUNTER — Other Ambulatory Visit: Payer: Self-pay | Admitting: Internal Medicine

## 2020-12-22 DIAGNOSIS — I10 Essential (primary) hypertension: Secondary | ICD-10-CM

## 2020-12-22 LAB — GASTROINTESTINAL PATHOGEN PANEL PCR
C. difficile Tox A/B, PCR: NOT DETECTED
Campylobacter, PCR: NOT DETECTED
Cryptosporidium, PCR: NOT DETECTED
E coli (ETEC) LT/ST PCR: NOT DETECTED
E coli (STEC) stx1/stx2, PCR: NOT DETECTED
E coli 0157, PCR: NOT DETECTED
Giardia lamblia, PCR: NOT DETECTED
Norovirus, PCR: DETECTED — AB
Rotavirus A, PCR: NOT DETECTED
Salmonella, PCR: NOT DETECTED
Shigella, PCR: NOT DETECTED

## 2020-12-22 MED ORDER — AMLODIPINE BESYLATE 5 MG PO TABS: ORAL_TABLET | ORAL | 1 refills | Status: DC

## 2021-01-21 MED FILL — Allopurinol Tab 300 MG: ORAL | 90 days supply | Qty: 90 | Fill #0 | Status: AC

## 2021-01-22 ENCOUNTER — Other Ambulatory Visit (HOSPITAL_COMMUNITY): Payer: Self-pay

## 2021-02-15 ENCOUNTER — Other Ambulatory Visit: Payer: Self-pay | Admitting: *Deleted

## 2021-02-15 ENCOUNTER — Encounter: Payer: Self-pay | Admitting: Internal Medicine

## 2021-02-15 ENCOUNTER — Other Ambulatory Visit: Payer: Self-pay

## 2021-02-15 ENCOUNTER — Other Ambulatory Visit (HOSPITAL_COMMUNITY): Payer: Self-pay

## 2021-02-15 ENCOUNTER — Ambulatory Visit (INDEPENDENT_AMBULATORY_CARE_PROVIDER_SITE_OTHER): Payer: PPO | Admitting: Internal Medicine

## 2021-02-15 VITALS — BP 116/76 | HR 64 | Temp 97.7°F | Resp 16 | Ht 72.75 in | Wt 264.8 lb

## 2021-02-15 DIAGNOSIS — M1000 Idiopathic gout, unspecified site: Secondary | ICD-10-CM | POA: Diagnosis not present

## 2021-02-15 DIAGNOSIS — I10 Essential (primary) hypertension: Secondary | ICD-10-CM | POA: Diagnosis not present

## 2021-02-15 DIAGNOSIS — R7309 Other abnormal glucose: Secondary | ICD-10-CM | POA: Diagnosis not present

## 2021-02-15 DIAGNOSIS — E559 Vitamin D deficiency, unspecified: Secondary | ICD-10-CM | POA: Diagnosis not present

## 2021-02-15 DIAGNOSIS — I4891 Unspecified atrial fibrillation: Secondary | ICD-10-CM | POA: Diagnosis not present

## 2021-02-15 DIAGNOSIS — Z79899 Other long term (current) drug therapy: Secondary | ICD-10-CM | POA: Diagnosis not present

## 2021-02-15 DIAGNOSIS — E782 Mixed hyperlipidemia: Secondary | ICD-10-CM | POA: Diagnosis not present

## 2021-02-15 MED ORDER — HYDROCHLOROTHIAZIDE 25 MG PO TABS
ORAL_TABLET | ORAL | 1 refills | Status: DC
  Filled 2021-02-15: qty 90, 90d supply, fill #0
  Filled 2021-05-27: qty 90, 90d supply, fill #1

## 2021-02-15 NOTE — Patient Instructions (Signed)

## 2021-02-15 NOTE — Progress Notes (Signed)
Future Appointments  Date Time Provider [place]  02/15/2021  4:30 PM [name], MD [facility] None  08/28/2021  -  CPE  2:00 PM [name], MD [facility] None  11/19/2021   - Wellness  3:30 PM [name], NP [facility] None    History of Present Illness:       This very nice 70 y.o. DWM  presents for 3 month follow up with HTN, HLD, Pre-Diabetes and Vitamin D Deficiency.        Patient is treated for HTN (2009) & BP has been controlled at home. Today's BP is art goal -  116/76.   In May 2020, patient was dx'd w/ pAfib predating from [facility]).  Patient has elected not to take coumadin or a NOAC. Apparently patient is in chronic A.fib.  Patient has had no complaints of any cardiac type chest pain, palpitations, dyspnea / orthopnea / PND, dizziness, claudication, or dependent edema.       Hyperlipidemia is not controlled with diet & meds. Patient denies myalgias or other med SE's. Last Lipids were  Lab Results  Component Value Date   CHOL 166 11/24/2020   HDL 40 11/24/2020   LDLCALC 110 (H) 11/24/2020   TRIG 74 11/24/2020   CHOLHDL 4.2 11/24/2020     Also, the patient has history of PreDiabetes and has had no symptoms of reactive hypoglycemia, diabetic polys, paresthesias or visual blurring.  Last A1c was not at goal:  Lab Results  Component Value Date   HGBA1C 5.9 (H) 11/24/2020        Further, the patient also has history of Vitamin D Deficiency and supplements vitamin D without any suspected side-effects. Last vitamin D was   Lab Results  Component Value Date   VD25OH 89 08/11/2020     Current Outpatient Medications on File Prior to Visit  Medication Sig  . allopurinol (ZYLOPRIM) 300 MG tablet TAKE 1 TABLET BY MOUTH ONCE DAILY TO TO PREVENT GOUT  . amLODipine (NORVASC) 5 MG tablet TAKE 1 TABLET BY MOUTH ONCE DAILY FOR BLOOD PRESSURE (Patient taking differently: Take by mouth daily. for blood pressure)  . Ascorbic Acid  (VITAMIN C) 1000 MG tablet Takes Daily (Patient taking differently: Take 1,000 mg by mouth daily.)  . aspirin EC 81 MG tablet Takes 1 tablet Daily  . b complex vitamins tablet Take 1 tablet  daily.  Marland Kitchen VITAMIN D 29937 units TABS Take 10,000 Units  daily.  . colchicine 0.6 MG tablet Take 1 tablet 2 x /day for Gout   . HM OMEGA-3-6-9   Take 1 Package  daily  . LECITHIN PO Take 1 tablet  daily.   Marland Kitchen RESVERATROL DIET  Take 1 capsule adily.  . vitamin E 1000 UNIT capsule Take 1 capsule ( daily.  Marland Kitchen zinc gluconate 50 MG tablet Takes 1 tablet every  day    No Known Allergies  PMHx:   Past Medical History:  Diagnosis Date  . Cataract 2008   os  . Cataract 2017   od  . Degenerative joint disease (DJD) of hip   . Diverticulitis large intestine   . History of acute pancreatitis 01/15/2018  . History of diverticulitis 01/15/2018  . Hx of vitrectomy Sept 2010 Left eye  02/22/2015   Retinal detachment surgery per Deloria Lair   . Hyperlipidemia 11/2017   LDL 108  . Hypertension 2009   started HCTZ & switched to Norvasc in Jan 2019  .  Pancreatitis 09/2017  . Retinal detachment of left eye with single retinal tear 2010   Vitrectomy - Dr [name]  . 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 EUS N/A 02/04/2018   Procedure: UPPER ENDOSCOPIC ULTRASOUND (EUS) RADIAL;  Surgeon: Arta Silence, MD;  Location: [facility];  Service: Endoscopy;  Laterality: N/A;  . EYE SURGERY Bilateral    Left 08/2007, R 02/02/2013  . TOTAL HIP ARTHROPLASTY Right 02/19/2019   Procedure: RIGHT TOTAL HIP ARTHROPLASTY ANTERIOR APPROACH;  Surgeon: Mcarthur Rossetti, MD;  Location: [facility];  Service: Orthopedics;  Laterality: Right;    FHx:    Reviewed / unchanged  SHx:    Reviewed / unchanged   Systems Review:  Constitutional: Denies fever, chills, wt changes, headaches, insomnia, fatigue, night sweats, change in appetite. Eyes: Denies  redness, blurred vision, diplopia, discharge, itchy, watery eyes.  ENT: Denies discharge, congestion, post nasal drip, epistaxis, sore throat, earache, hearing loss, dental pain, tinnitus, vertigo, sinus pain, snoring.  CV: Denies chest pain, palpitations, irregular heartbeat, syncope, dyspnea, diaphoresis, orthopnea, PND, claudication or edema. Respiratory: denies cough, dyspnea, DOE, pleurisy, hoarseness, laryngitis, wheezing.  Gastrointestinal: Denies dysphagia, odynophagia, heartburn, reflux, water brash, abdominal pain or cramps, nausea, vomiting, bloating, diarrhea, constipation, hematemesis, melena, hematochezia  or hemorrhoids. Genitourinary: Denies dysuria, frequency, urgency, nocturia, hesitancy, discharge, hematuria or flank pain. Musculoskeletal: Denies arthralgias, myalgias, stiffness, jt. swelling, pain, limping or strain/sprain.  Skin: Denies pruritus, rash, hives, warts, acne, eczema or change in skin lesion(s). Neuro: No weakness, tremor, incoordination, spasms, paresthesia or pain. Psychiatric: Denies confusion, memory loss or sensory loss. Endo: Denies change in weight, skin or hair change.  Heme/Lymph: No excessive bleeding, bruising or enlarged lymph nodes.  Physical Exam  BP 116/76   Pulse 64   Temp 97.7 F (36.5 C)   Resp 16   Ht 6' 0.75" (1.848 m)   Wt 264 lb 12.8 oz (120.1 kg)   SpO2 99%   BMI 35.18 kg/m   Appears  well nourished, well groomed  and in no distress.  Eyes: PERRLA, EOMs, conjunctiva no swelling or erythema. Sinuses: No frontal/maxillary tenderness ENT/Mouth: EAC's clear, TM's nl w/o erythema, bulging. Nares clear w/o erythema, swelling, exudates. Oropharynx clear without erythema or exudates. Oral hygiene is good. Tongue normal, non obstructing. Hearing intact.  Neck: Supple. Thyroid not palpable. Car 2+/2+ without bruits, nodes or JVD. Chest: Respirations nl with BS clear & equal w/o rales, rhonchi, wheezing or stridor.  Cor: Heart sounds  normal w/ regular rate and rhythm without sig. murmurs, gallops, clicks or rubs. Peripheral pulses normal and equal  without edema.  Abdomen: Soft & bowel sounds normal. Non-tender w/o guarding, rebound, hernias, masses or organomegaly.  Lymphatics: Unremarkable.  Musculoskeletal: Full ROM all peripheral extremities, joint stability, 5/5 strength and normal gait.  Skin: Warm, dry without exposed rashes, lesions or ecchymosis apparent.  Neuro: Cranial nerves intact, reflexes equal bilaterally. Sensory-motor testing grossly intact. Tendon reflexes grossly intact.  Pysch: Alert & oriented x 3.  Insight and judgement nl & appropriate. No ideations.  Assessment and Plan:  1. Essential hypertension  - Continue medication, monitor blood pressure at home.  - Continue DASH diet.  Reminder to go to the ER if any CP,  SOB, nausea, dizziness, severe HA, changes vision/speech.  - CBC with Differential/Platelet - COMPLETE METABOLIC PANEL WITH GFR - Magnesium - TSH  2. Hyperlipidemia, mixed  - Continue diet/meds,  exercise,& lifestyle modifications.  - Continue monitor periodic cholesterol/liver & renal functions   - Lipid panel - TSH  3. Abnormal glucose  - Continue diet, exercise  - Lifestyle modifications.  - Monitor appropriate labs  - Hemoglobin A1c - Insulin, random  4. Vitamin D deficiency  - Continue supplementation.  - VITAMIN D 25 Hydroxy   5. Idiopathic gout]][  - Uric acid  6. Atrial fibrillation with controlled ventricular response (HCC)  - TSH  7. Medication management  - CBC with Differential/Platelet - COMPLETE METABOLIC PANEL WITH GFR - Magnesium - Lipid panel - TSH - Hemoglobin A1c - Insulin, random - VITAMIN D 25 Hydroxy  - Uric acid        Discussed  regular exercise, BP monitoring, weight control to achieve/maintain BMI less than 25 and discussed med and SE's. Recommended labs to assess and monitor clinical status with further disposition pending  results of labs.  I discussed the assessment and treatment plan with the patient. The patient was provided an opportunity to ask questions and all were answered. The patient agreed with the plan and demonstrated an understanding of the instructions.  I provided over 30 minutes of exam, counseling, chart review and  complex critical decision making.        The patient was advised to call back or seek an in-person evaluation if the symptoms worsen or if the condition fails to improve as anticipated.   Kirtland Bouchard, MD

## 2021-02-16 LAB — CBC WITH DIFFERENTIAL/PLATELET
Absolute Monocytes: 621 cells/uL (ref 200–950)
Basophils Absolute: 83 cells/uL (ref 0–200)
Basophils Relative: 1.2 %
Eosinophils Absolute: 373 cells/uL (ref 15–500)
Eosinophils Relative: 5.4 %
HCT: 43 % (ref 38.5–50.0)
Hemoglobin: 14 g/dL (ref 13.2–17.1)
Lymphs Abs: 1697 cells/uL (ref 850–3900)
MCH: 30.3 pg (ref 27.0–33.0)
MCHC: 32.6 g/dL (ref 32.0–36.0)
MCV: 93.1 fL (ref 80.0–100.0)
MPV: 11.5 fL (ref 7.5–12.5)
Monocytes Relative: 9 %
Neutro Abs: 4126 cells/uL (ref 1500–7800)
Neutrophils Relative %: 59.8 %
Platelets: 252 10*3/uL (ref 140–400)
RBC: 4.62 10*6/uL (ref 4.20–5.80)
RDW: 13.8 % (ref 11.0–15.0)
Total Lymphocyte: 24.6 %
WBC: 6.9 10*3/uL (ref 3.8–10.8)

## 2021-02-16 LAB — COMPLETE METABOLIC PANEL WITH GFR
AG Ratio: 1.6 (calc) (ref 1.0–2.5)
ALT: 26 U/L (ref 9–46)
AST: 26 U/L (ref 10–35)
Albumin: 4.4 g/dL (ref 3.6–5.1)
Alkaline phosphatase (APISO): 68 U/L (ref 35–144)
BUN: 22 mg/dL (ref 7–25)
CO2: 27 mmol/L (ref 20–32)
Calcium: 9.7 mg/dL (ref 8.6–10.3)
Chloride: 103 mmol/L (ref 98–110)
Creat: 0.98 mg/dL (ref 0.70–1.25)
GFR, Est African American: 91 mL/min/{1.73_m2} (ref >=60)
GFR, Est Non African American: 79 mL/min/{1.73_m2} (ref >=60)
Globulin: 2.8 g/dL (calc) (ref 1.9–3.7)
Glucose, Bld: 83 mg/dL (ref 65–99)
Potassium: 3.8 mmol/L (ref 3.5–5.3)
Sodium: 141 mmol/L (ref 135–146)
Total Bilirubin: 0.7 mg/dL (ref 0.2–1.2)
Total Protein: 7.2 g/dL (ref 6.1–8.1)

## 2021-02-16 LAB — LIPID PANEL
Cholesterol: 171 mg/dL (ref <200)
HDL: 39 mg/dL — ABNORMAL LOW (ref >=40)
LDL Cholesterol (Calc): 116 mg/dL (calc) — ABNORMAL HIGH
Non-HDL Cholesterol (Calc): 132 mg/dL (calc) — ABNORMAL HIGH (ref <130)
Total CHOL/HDL Ratio: 4.4 (calc) (ref <5.0)
Triglycerides: 69 mg/dL (ref <150)

## 2021-02-16 LAB — MAGNESIUM: Magnesium: 2 mg/dL (ref 1.5–2.5)

## 2021-02-16 LAB — HEMOGLOBIN A1C
Hgb A1c MFr Bld: 5.8 % of total Hgb — ABNORMAL HIGH (ref <5.7)
Mean Plasma Glucose: 120 mg/dL
eAG (mmol/L): 6.6 mmol/L

## 2021-02-16 LAB — TSH: TSH: 1.42 mIU/L (ref 0.40–4.50)

## 2021-02-16 LAB — INSULIN, RANDOM: Insulin: 12.6 u[IU]/mL

## 2021-02-16 LAB — URIC ACID: Uric Acid, Serum: 5.8 mg/dL (ref 4.0–8.0)

## 2021-02-16 LAB — VITAMIN D 25 HYDROXY (VIT D DEFICIENCY, FRACTURES): Vit D, 25-Hydroxy: 54 ng/mL (ref 30–100)

## 2021-02-16 NOTE — Progress Notes (Signed)
============================================================ - Test results slightly outside the reference range are not unusual. If there is anything important, I will review this with you,  otherwise it is considered normal test values.  If you have further questions,  please do not hesitate to contact me at the office or via My Chart.  ============================================================ ============================================================  -  Total Chol = 171 -  Excellent   - Very low risk for Heart Attack  / Stroke ========================================================  -  But Bad /Dangerous LDL Chol = 116 -->> too high             (  Ideal or Goal is Less than 70   !  )   So. . . . .  - Recommend la stricter low cholesterol diet   - Cholesterol only comes from animal sources  - ie. meat, dairy, egg yolks  - Eat all the vegetables you want.  - Avoid meat, especially red meat - Beef AND Pork .  - Avoid cheese & dairy - milk & ice cream.     - Cheese is the most concentrated form of trans-fats which  is the worst thing to clog up our arteries.   - Veggie cheese is OK which can be found in the fresh  produce section at [employer] or [employer] or [employer] ============================================================ ============================================================  -  A1c = 5.8% - Still elevated in the borderline and                                                 early or pre-diabetes range which has the same   300% increased risk for heart attack, stroke, cancer and   Alzheimer- type vascular dementia as full blown diabetes.   But the good news is that diet, exercise with  weight loss can cure the early diabetes at this point. ============================================================ ============================================================  -  Vit D = 54 - "OK"  - Vitamin D goal is between 70-100.   - Please make sure  that you are taking your Vitamin D as directed.   - It is very important as a natural anti-inflammatory and helping the  immune system protect against viral infections, like the Covid-19    helping hair, skin, and nails, as well as reducing stroke and  heart attack risk.   - It helps your bones and helps with mood.  - It also decreases numerous cancer risks so please  take it as directed.   - Low Vit D is associated with a 200-300% higher risk for  CANCER   and 200-300% higher risk for HEART   ATTACK  &  STROKE.    - It is also associated with higher death rate at younger ages,   autoimmune diseases like Rheumatoid arthritis, Lupus,  Multiple Sclerosis.     - Also many other serious conditions, like depression, Alzheimer's  Dementia, infertility, muscle aches, fatigue, fibromyalgia   - just to name a few. ============================================================ ============================================================  -  Uric Acid / Gout Test - Normal & OK  ============================================================ ============================================================  -  All Else - CBC - Kidneys - Electrolytes - Liver - Magnesium & Thyroid    - all  Normal / OK ===========================================================                    ============================================================ - Test results slightly outside the reference  range are not unusual. If there is anything important, I will review this with you,  otherwise it is considered normal test values.  If you have further questions,  please do not hesitate to contact me at the office or via My Chart.  ============================================================ ============================================================  -  Total Chol = 171 -  Excellent   - Very low risk for Heart Attack  /  Stroke ========================================================  -  But Bad /Dangerous LDL Chol = 116 -->> too high             (  Ideal or Goal is Less than 70   !  )   So. . . . .  - Recommend la stricter low cholesterol diet   - Cholesterol only comes from animal sources  - ie. meat, dairy, egg yolks  - Eat all the vegetables you want.  - Avoid meat, especially red meat - Beef AND Pork .  - Avoid cheese & dairy - milk & ice cream.     - Cheese is the most concentrated form of trans-fats which  is the worst thing to clog up our arteries.   - Veggie cheese is OK which can be found in the fresh  produce section at [employer] or [employer] or [employer] ============================================================ ============================================================  -  A1c = 5.8% - Still elevated in the borderline and                                                 early or pre-diabetes range which has the same   300% increased risk for heart attack, stroke, cancer and   Alzheimer- type vascular dementia as full blown diabetes.   But the good news is that diet, exercise with  weight loss can cure the early diabetes at this point. ============================================================ ============================================================  -  Vit D = 54 - "OK"  - Vitamin D goal is between 70-100.   - Please make sure that you are taking your Vitamin D as directed.   - It is very important as a natural anti-inflammatory and helping the  immune system protect against viral infections, like the Covid-19    helping hair, skin, and nails, as well as reducing stroke and  heart attack risk.   - It helps your bones and helps with mood.  - It also decreases numerous cancer risks so please  take it as directed.   - Low Vit D is associated with a 200-300% higher risk for  CANCER   and 200-300% higher risk for HEART   ATTACK  &  STROKE.    - It is  also associated with higher death rate at younger ages,   autoimmune diseases like Rheumatoid arthritis, Lupus,  Multiple Sclerosis.     - Also many other serious conditions, like depression, Alzheimer's  Dementia, infertility, muscle aches, fatigue, fibromyalgia   - just to name a few. ============================================================ ============================================================  -  Uric Acid / Gout Test - Normal & OK  ============================================================ ============================================================  -  All Else - CBC - Kidneys - Electrolytes - Liver - Magnesium & Thyroid    - all  Normal / OK ===========================================================

## 2021-03-11 ENCOUNTER — Other Ambulatory Visit: Payer: Self-pay | Admitting: Internal Medicine

## 2021-03-11 DIAGNOSIS — S81802A Unspecified open wound, left lower leg, initial encounter: Secondary | ICD-10-CM

## 2021-03-12 ENCOUNTER — Other Ambulatory Visit: Payer: Self-pay

## 2021-03-12 ENCOUNTER — Ambulatory Visit (INDEPENDENT_AMBULATORY_CARE_PROVIDER_SITE_OTHER): Payer: PPO

## 2021-03-12 DIAGNOSIS — S81802A Unspecified open wound, left lower leg, initial encounter: Secondary | ICD-10-CM | POA: Diagnosis not present

## 2021-03-12 DIAGNOSIS — Z23 Encounter for immunization: Secondary | ICD-10-CM

## 2021-03-13 ENCOUNTER — Ambulatory Visit: Payer: PPO | Admitting: Adult Health

## 2021-04-20 ENCOUNTER — Other Ambulatory Visit (HOSPITAL_COMMUNITY): Payer: Self-pay

## 2021-04-20 MED FILL — Allopurinol Tab 300 MG: ORAL | 90 days supply | Qty: 90 | Fill #1 | Status: AC

## 2021-05-28 ENCOUNTER — Other Ambulatory Visit (HOSPITAL_COMMUNITY): Payer: Self-pay

## 2021-07-25 ENCOUNTER — Other Ambulatory Visit: Payer: Self-pay | Admitting: Internal Medicine

## 2021-07-25 ENCOUNTER — Other Ambulatory Visit (HOSPITAL_COMMUNITY): Payer: Self-pay

## 2021-07-25 DIAGNOSIS — M10079 Idiopathic gout, unspecified ankle and foot: Secondary | ICD-10-CM

## 2021-07-25 MED ORDER — ALLOPURINOL 300 MG PO TABS
ORAL_TABLET | ORAL | 3 refills | Status: DC
  Filled 2021-07-25: qty 90, 90d supply, fill #0
  Filled 2021-10-25: qty 90, 90d supply, fill #1
  Filled 2022-01-31: qty 90, 90d supply, fill #2
  Filled 2022-04-27: qty 90, 90d supply, fill #3

## 2021-08-28 ENCOUNTER — Encounter: Payer: PPO | Admitting: Internal Medicine

## 2021-08-29 ENCOUNTER — Other Ambulatory Visit (HOSPITAL_COMMUNITY): Payer: Self-pay

## 2021-08-29 ENCOUNTER — Other Ambulatory Visit: Payer: Self-pay | Admitting: Internal Medicine

## 2021-08-29 DIAGNOSIS — I10 Essential (primary) hypertension: Secondary | ICD-10-CM

## 2021-08-29 MED ORDER — HYDROCHLOROTHIAZIDE 25 MG PO TABS
ORAL_TABLET | ORAL | 1 refills | Status: DC
  Filled 2021-08-29: qty 90, 90d supply, fill #0
  Filled 2021-11-29: qty 90, 90d supply, fill #1

## 2021-09-22 ENCOUNTER — Other Ambulatory Visit: Payer: Self-pay | Admitting: Internal Medicine

## 2021-09-22 MED ORDER — TRULICITY 0.75 MG/0.5ML ~~LOC~~ SOAJ
SUBCUTANEOUS | 0 refills | Status: DC
  Filled 2021-09-22: qty 2, 28d supply, fill #0

## 2021-09-23 ENCOUNTER — Other Ambulatory Visit (HOSPITAL_COMMUNITY): Payer: Self-pay

## 2021-09-25 ENCOUNTER — Other Ambulatory Visit (HOSPITAL_COMMUNITY): Payer: Self-pay

## 2021-09-26 ENCOUNTER — Other Ambulatory Visit (HOSPITAL_COMMUNITY): Payer: Self-pay

## 2021-10-26 ENCOUNTER — Other Ambulatory Visit (HOSPITAL_COMMUNITY): Payer: Self-pay

## 2021-11-14 NOTE — Progress Notes (Signed)
MEDICARE ANNUAL WELLNESS VISIT AND FOLLOW UP Assessment:   Younis was seen today for medicare wellness.  Diagnoses and all orders for this visit:  Encounter for Medicare annual wellness exam He declines tetanus booster, pneumonia vaccines, shingrix  Getting flu vaccine annually via work Encouraged to schedule follow up with vision and dental providers   Essential hypertension Continue HCTZ 25 mg QD Monitor blood pressure at home; call if consistently over 130/80 Continue DASH diet.   Reminder to go to the ER if any CP, SOB, nausea, dizziness, severe HA, changes vision/speech, left arm numbness and tingling and jaw pain.  Sigmoid diverticulosis Continue lifestyle modification; consider adding daily fiber supplement  Other abnormal glucose (prediabetes) Discussed disease and risks Discussed diet/exercise, weight management  - A1c  Morbid obesity - BMI 30+ with OSA Long discussion about weight loss, diet, and exercise Recommended diet heavy in fruits and veggies and low in animal meats, cheeses, and dairy products, appropriate calorie intake Discussed appropriate weight for height  Follow up at next visit  Hyperlipidemia, unspecified hyperlipidemia type Mild elevations not requiring statin therapy; lifestyle only at this time Continue low cholesterol diet and exercise.  Monitor routinely -Lipid panel - TSH  Vitamin D deficiency Continue supplementation for goal of 60-100 Defer vitamin D level  Anemia  - Colonoscopy per Dr. Cristina Gong with numerous diverticuloses - resolved on CBC at work 3/24, recheck CBC  Abnormal serum uric acid  Has adjusted lifestyle;  Recheck uric acid  S/p R hip THA Doing well without complications  A. Fib with controlled ventricular response (HCC) Discussed chadsvasc score of 2; 2.9% risk of clot/CVA, would recommend anticoagulation, Dr. Caryl Comes recommended CV Patient has considered at length, states he understands risks (patient is  surgeon/MD) and has decided to NOT PURSUE He monitors via apple watch, taking ASA 81. Understands this is not proven to reduce risk of a. Fib related clots patient to go to ER if there is weakness, thunderclap headache, visual changes, or any concerning factors Continue to monitor;   Zinc deficiency - zinc level drawn today  Skin lesion Continue to monitor and if changes size or color have dermatology evaluate Strongly encouraged to establish care with dermatologist for skin check  Over 30 minutes of exam, counseling, chart review, and critical decision making was performed  Future Appointments  Date Time Provider [place]  11/19/2022  3:00 PM Magda Bernheim, NP [facility] None     Plan:   During the course of the visit the patient was educated and counseled about appropriate screening and preventive services including:   Pneumococcal vaccine  Influenza vaccine Prevnar 13 Td vaccine Screening electrocardiogram Colorectal cancer screening Diabetes screening Glaucoma screening Nutrition counseling    Subjective:  Harold Earls, 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 Ninfa Linden in 01/2019 and has done well since (mild residual numbness of the thigh).   He was found to be in asymptomatic persistent a. Fib at preop screening, ECHO 02/15/2019 showed Persistent Afib, Normal Heart Valves, Normal Ejection Fraction (60-65%), mild -moderate Atrial dilation consequent of the Afib and was referred to cardiology, saw Dr. Caryl Comes who transitioned to apixoban 5 mg BID (Cha2ds2-vasc of 2),  now only taking a baby ASA. Sleep study by Dr. Radford Pax which found mod/severe  OSA and was initiated on CPAP, reports is trying to increase use, has been using using intermittently, using very  infrequently  Patient preferred to avoid NOAC (has seen numerous complications with patients personally), and today shares he has considered risk of CVA (2.9% per chadsvasc score), after understanding risks, has decided wouldn't pursue aggressive treatment either way. Declines follow up with cardiology, would not pursue DCCV, declines NOAC. Tracking with apple watch.   He  has had tinnitus since Covid.  Continues to be an issue.    He does take allopurinol daily for gout prevention and will use colchicine with flares. He reports has been avoiding red meat, no recurrent sx since that time -  Lab Results  Component Value Date   LABURIC 5.8 02/15/2021   CBC trending down from last year, recently reported black stools after taking iburprofen/prednisone for "gout" attack, With hx/o Colon Ca in both parents and last Colon in 2006, had negative and colonoscopy 12/10/2019 Dr Cristina Gong for colonoscopy which showed 1 polyp and widespread colonic diverticuloses.  Most recent CBC was WNL CBC Latest Ref Rng & Units 02/15/2021 11/24/2020 06/13/2020  WBC 3.8 - 10.8 Thousand/uL 6.9 5.9 7.9  Hemoglobin 13.2 - 17.1 g/dL 14.0 15.1 13.5  Hematocrit 38.5 - 50.0 % 43.0 44.4 42.1  Platelets 140 - 400 Thousand/uL 252 221 427(H)   BMI is Body mass index is 34.94 kg/m., he has been working on diet and exercise. 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  Doing [place], greek yogurt, planning to restart a modified keto type diet, protein + low starch veggies . He was down to 250. Wt Readings from Last 3 Encounters:  11/19/21 263 lb (119.3 kg)  02/15/21 264 lb 12.8 oz (120.1 kg)  11/24/20 266 lb (120.7 kg)   His blood pressure has been controlled at home by HCTZ 25 mg daily, today their BP is BP: 130/72 BP Readings from Last 3 Encounters:  11/19/21 130/72  02/15/21 116/76  11/24/20 140/82    He does workout. He denies chest  pain, shortness of breath, dizziness.   He is not on cholesterol medication and denies myalgias. His cholesterol is not at goal, mild LDL elevation Had recheck on 12/22/2019 at work, LDL was 93 at that time  The cholesterol last visit was:   Lab Results  Component Value Date   CHOL 171 02/15/2021   HDL 39 (L) 02/15/2021   LDLCALC 116 (H) 02/15/2021   TRIG 69 02/15/2021   CHOLHDL 4.4 02/15/2021    He has been working on diet and exercise for prediabetes, and denies increased appetite, nausea, paresthesia of the feet, polydipsia, polyuria and visual disturbances.  Had A1C checked at work on 12/22/2019, was 5.7% Last A1C in the office was:  Lab Results  Component Value Date   HGBA1C 5.8 (H) 02/15/2021   Last GFR Lab Results  Component Value Date   GFRNONAA 79 02/15/2021   Patient is on Vitamin D supplement, taking 10000 IU daily:  Lab Results  Component Value Date   VD25OH 54 02/15/2021        Medication Review:    Current Outpatient Medications (Cardiovascular):    hydrochlorothiazide (HYDRODIURIL) 25 MG tablet, TAKE 1 TABLET BY MOUTH DAILY FOR BLOOD PRESSURE AND FLUID RETENTION   Current Outpatient Medications (Analgesics):    allopurinol (ZYLOPRIM) 300 MG tablet, TAKE 1 TABLET BY MOUTH ONCE DAILY TO TO  PREVENT GOUT   aspirin EC 81 MG tablet, Takes 1 tablet Daily   Current Outpatient Medications (Other):    Cholecalciferol (VITAMIN D3) 10000 units TABS, Take 10,000 Units by mouth daily.   LECITHIN PO, Take 1 tablet by mouth daily.    MAGNESIUM PO, Take by mouth.   Menaquinone-7 (K2 PO), Take by mouth.   Misc Natural Products (RESVERATROL DIET PO), Take 1 capsule by mouth daily.   VITAMIN A PO, Take by mouth.   vitamin E 1000 UNIT capsule, Take 1 capsule (1,000 Units total) by mouth daily.   zinc gluconate 50 MG tablet, Takes 1 tablet every other day (Patient taking differently: Take 50 mg by mouth daily.)  Allergies: No Known Allergies  Current Problems  (verified) has Essential hypertension; Sigmoid diverticulosis; Morbid obesity ([place]) - BMI 30+ with OSA; Hyperlipidemia, mixed; Vitamin D deficiency; Atrial fibrillation with controlled ventricular response ([place]); OSA on CPAP; and Gout on their problem list.  Screening Tests Immunization History  Administered Date(s) Administered   Influenza-Unspecified 06/30/2020, 07/05/2021   PFIZER(Purple Top)SARS-COV-2 Vaccination 09/19/2019, 10/07/2019   Tdap 03/12/2021    Preventative care: Last colonoscopy: last 11/2019, getting every 5 years by Dr. Cristina Gong, diverticulosis and 1 polyp (benign)  Prior vaccinations: TD or Tdap: 2005 declines   Influenza: 2020 via hospital   Pneumococcal: declines  Prevnar13: declines Shingles/Zostavax: declines shingrix  Covid 19: 2/2, 2021 [place] of Other Physician/Practitioners you currently use: 1. [facility] here for primary care 2. Dr. Kathrin Penner, eye doctor, last visit 2021, needs to follow up   3. Dr. 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 Family His family history includes Cancer in his brother, father, mother, and sister; Heart disease in his brother; Stroke in his mother. Social history  He reports that he has never smoked. He has never used smokeless tobacco. He reports current alcohol use. He reports that he does not use drugs.  MEDICARE WELLNESS OBJECTIVES: Physical activity: Current Exercise Habits: The patient does not participate in regular exercise at present, Exercise limited by: None identified Cardiac risk factors: Cardiac Risk Factors include: advanced age (>65men, >36 women);dyslipidemia;hypertension;sedentary lifestyle;obesity (BMI >30kg/m2);male gender Depression/mood  screen:   Depression screen [facility] 2/9 11/19/2021  Decreased Interest 0  Down, Depressed, Hopeless 0  PHQ - 2 Score 0    ADLs:  In your present state of health, do you have any difficulty performing the following activities: 11/19/2021  Hearing? N  Vision? N  Difficulty concentrating or making decisions? N  Walking or climbing stairs? N  Dressing or bathing? N  Doing errands, shopping? N  Some recent data might be hidden      Cognitive Testing  Alert? Yes  Normal Appearance?Yes  Oriented to person? Yes  Place? Yes   Time? Yes  Recall of three objects?  Yes  Can perform simple calculations? Yes  Displays appropriate judgment?Yes  Can read the correct time from a watch face?Yes  EOL planning: Does Patient Have a Medical Advance Directive?: Yes Type of Advance Directive: Healthcare Power of Attorney, Living will Does patient want to make changes to medical advance directive?: No - Patient declined Copy of [place] in Chart?: No - copy requested   Objective:   Today's Vitals   11/19/21 1520  BP: 130/72  Pulse: 79  Temp: 97.9 F (36.6 C)  SpO2: 97%  Weight: 263 lb (119.3 kg)    Body mass index is 34.94 kg/m.  Eyes: PERRLA, EOMs, conjunctiva no swelling or erythema Sinuses: No frontal/maxillary tenderness ENT/Mouth: EACs patent / TMs  nl. Nares clear without erythema, swelling, mucoid exudates. Oral hygiene is good. No erythema, swelling, or exudate. Tongue normal, non-obstructing. Tonsils not swollen or erythematous. Hearing normal.  Neck: Supple, thyroid not palpable. No bruits, nodes or JVD. Respiratory: Respiratory effort normal.  BS equal and clear bilateral without rales, rhonci, wheezing or stridor. Cardio: Heart sounds irregularly irregular and no murmurs. Peripheral pulses are normal and equal bilaterally without edema/ PP 2-3(+) bilateral.   Chest: symmetric with normal excursions and percussion.  Abdomen: Soft, with Nl bowel sounds.  Nontender, no guarding, rebound, hernias, masses, or organomegaly.  Lymphatics: Non tender without lymphadenopathy.  Musculoskeletal: Full ROM all peripheral extremities, joint stability, 5/5 strength, and normal gait. Skin: Warm and dry . Small red area on left cheek where previous wart was removed  Neuro: Cranial nerves intact, reflexes equal bilaterally. Normal muscle tone, no cerebellar symptoms. Sensation intact/  Pysch: Alert and oriented X 3 with normal affect, insight and judgment appropriate.   Medicare Attestation I have personally reviewed: The patient's medical and social history Their use of alcohol, tobacco or illicit drugs Their current medications and supplements The patient's functional ability including ADLs,fall risks, home safety risks, cognitive, and hearing and visual impairment Diet and physical activities Evidence for depression or mood disorders  The patient's weight, height, BMI, and visual acuity have been recorded in the chart.  I have made referrals, counseling, and provided education to the patient based on review of the above and I have provided the patient with a written personalized care plan for preventive services.     Magda Bernheim, NP   11/19/2021

## 2021-11-19 ENCOUNTER — Other Ambulatory Visit: Payer: Self-pay

## 2021-11-19 ENCOUNTER — Encounter: Payer: Self-pay | Admitting: Nurse Practitioner

## 2021-11-19 ENCOUNTER — Ambulatory Visit (INDEPENDENT_AMBULATORY_CARE_PROVIDER_SITE_OTHER): Payer: PPO | Admitting: Nurse Practitioner

## 2021-11-19 VITALS — BP 130/72 | HR 79 | Temp 97.9°F | Wt 263.0 lb

## 2021-11-19 DIAGNOSIS — K5730 Diverticulosis of large intestine without perforation or abscess without bleeding: Secondary | ICD-10-CM

## 2021-11-19 DIAGNOSIS — E782 Mixed hyperlipidemia: Secondary | ICD-10-CM

## 2021-11-19 DIAGNOSIS — L989 Disorder of the skin and subcutaneous tissue, unspecified: Secondary | ICD-10-CM | POA: Diagnosis not present

## 2021-11-19 DIAGNOSIS — Z0001 Encounter for general adult medical examination with abnormal findings: Secondary | ICD-10-CM | POA: Diagnosis not present

## 2021-11-19 DIAGNOSIS — R6889 Other general symptoms and signs: Secondary | ICD-10-CM | POA: Diagnosis not present

## 2021-11-19 DIAGNOSIS — Z79899 Other long term (current) drug therapy: Secondary | ICD-10-CM

## 2021-11-19 DIAGNOSIS — E790 Hyperuricemia without signs of inflammatory arthritis and tophaceous disease: Secondary | ICD-10-CM | POA: Diagnosis not present

## 2021-11-19 DIAGNOSIS — R7309 Other abnormal glucose: Secondary | ICD-10-CM

## 2021-11-19 DIAGNOSIS — Z96641 Presence of right artificial hip joint: Secondary | ICD-10-CM

## 2021-11-19 DIAGNOSIS — I10 Essential (primary) hypertension: Secondary | ICD-10-CM

## 2021-11-19 DIAGNOSIS — I4891 Unspecified atrial fibrillation: Secondary | ICD-10-CM | POA: Diagnosis not present

## 2021-11-19 DIAGNOSIS — Z0000 Encounter for general adult medical examination without abnormal findings: Secondary | ICD-10-CM

## 2021-11-19 DIAGNOSIS — E60 Dietary zinc deficiency: Secondary | ICD-10-CM

## 2021-11-19 DIAGNOSIS — E559 Vitamin D deficiency, unspecified: Secondary | ICD-10-CM | POA: Diagnosis not present

## 2021-11-21 LAB — HEMOGLOBIN A1C
Hgb A1c MFr Bld: 5.9 % of total Hgb — ABNORMAL HIGH (ref <5.7)
Mean Plasma Glucose: 123 mg/dL
eAG (mmol/L): 6.8 mmol/L

## 2021-11-21 LAB — COMPLETE METABOLIC PANEL WITH GFR
AG Ratio: 1.5 (calc) (ref 1.0–2.5)
ALT: 26 U/L (ref 9–46)
AST: 31 U/L (ref 10–35)
Albumin: 4.4 g/dL (ref 3.6–5.1)
Alkaline phosphatase (APISO): 78 U/L (ref 35–144)
BUN: 25 mg/dL (ref 7–25)
CO2: 29 mmol/L (ref 20–32)
Calcium: 10 mg/dL (ref 8.6–10.3)
Chloride: 101 mmol/L (ref 98–110)
Creat: 1.2 mg/dL (ref 0.70–1.35)
Globulin: 3 g/dL (calc) (ref 1.9–3.7)
Glucose, Bld: 93 mg/dL (ref 65–99)
Potassium: 3.7 mmol/L (ref 3.5–5.3)
Sodium: 141 mmol/L (ref 135–146)
Total Bilirubin: 0.8 mg/dL (ref 0.2–1.2)
Total Protein: 7.4 g/dL (ref 6.1–8.1)
eGFR: 66 mL/min/{1.73_m2} (ref >=60)

## 2021-11-21 LAB — LIPID PANEL
Cholesterol: 157 mg/dL (ref <200)
HDL: 37 mg/dL — ABNORMAL LOW (ref >=40)
LDL Cholesterol (Calc): 102 mg/dL (calc) — ABNORMAL HIGH
Non-HDL Cholesterol (Calc): 120 mg/dL (calc) (ref <130)
Total CHOL/HDL Ratio: 4.2 (calc) (ref <5.0)
Triglycerides: 86 mg/dL (ref <150)

## 2021-11-21 LAB — URIC ACID: Uric Acid, Serum: 6.2 mg/dL (ref 4.0–8.0)

## 2021-11-21 LAB — CBC WITH DIFFERENTIAL/PLATELET
Absolute Monocytes: 864 cells/uL (ref 200–950)
Basophils Absolute: 72 cells/uL (ref 0–200)
Basophils Relative: 0.9 %
Eosinophils Absolute: 200 cells/uL (ref 15–500)
Eosinophils Relative: 2.5 %
HCT: 42.6 % (ref 38.5–50.0)
Hemoglobin: 14.4 g/dL (ref 13.2–17.1)
Lymphs Abs: 1552 cells/uL (ref 850–3900)
MCH: 31.3 pg (ref 27.0–33.0)
MCHC: 33.8 g/dL (ref 32.0–36.0)
MCV: 92.6 fL (ref 80.0–100.0)
MPV: 11.6 fL (ref 7.5–12.5)
Monocytes Relative: 10.8 %
Neutro Abs: 5312 cells/uL (ref 1500–7800)
Neutrophils Relative %: 66.4 %
Platelets: 249 10*3/uL (ref 140–400)
RBC: 4.6 10*6/uL (ref 4.20–5.80)
RDW: 13.4 % (ref 11.0–15.0)
Total Lymphocyte: 19.4 %
WBC: 8 10*3/uL (ref 3.8–10.8)

## 2021-11-21 LAB — TSH: TSH: 1.56 mIU/L (ref 0.40–4.50)

## 2021-11-21 LAB — ZINC: Zinc: 87 ug/dL (ref 60–130)

## 2021-11-30 ENCOUNTER — Other Ambulatory Visit (HOSPITAL_COMMUNITY): Payer: Self-pay

## 2021-11-30 ENCOUNTER — Other Ambulatory Visit: Payer: Self-pay | Admitting: Internal Medicine

## 2021-11-30 MED ORDER — AZITHROMYCIN 250 MG PO TABS
ORAL_TABLET | ORAL | 1 refills | Status: DC
  Filled 2021-11-30: qty 6, 5d supply, fill #0
  Filled 2022-01-19: qty 6, 5d supply, fill #1

## 2021-11-30 MED ORDER — BENZONATATE 200 MG PO CAPS
ORAL_CAPSULE | ORAL | 1 refills | Status: DC
Start: 2021-11-30 — End: 2022-01-31
  Filled 2021-11-30: qty 30, 10d supply, fill #0

## 2022-01-19 ENCOUNTER — Other Ambulatory Visit (HOSPITAL_COMMUNITY): Payer: Self-pay

## 2022-01-25 ENCOUNTER — Other Ambulatory Visit: Payer: Self-pay

## 2022-01-25 DIAGNOSIS — M5442 Lumbago with sciatica, left side: Secondary | ICD-10-CM

## 2022-01-27 ENCOUNTER — Ambulatory Visit
Admission: RE | Admit: 2022-01-27 | Discharge: 2022-01-27 | Disposition: A | Discharge status: Home / Self Care | Payer: PPO | Source: Ambulatory Visit | Attending: Orthopaedic Surgery | Admitting: Orthopaedic Surgery

## 2022-01-27 DIAGNOSIS — M48061 Spinal stenosis, lumbar region without neurogenic claudication: Secondary | ICD-10-CM | POA: Diagnosis not present

## 2022-01-27 DIAGNOSIS — M5442 Lumbago with sciatica, left side: Secondary | ICD-10-CM

## 2022-01-31 ENCOUNTER — Other Ambulatory Visit: Payer: Self-pay | Admitting: Internal Medicine

## 2022-01-31 ENCOUNTER — Other Ambulatory Visit (HOSPITAL_COMMUNITY): Payer: Self-pay

## 2022-01-31 DIAGNOSIS — E1122 Type 2 diabetes mellitus with diabetic chronic kidney disease: Secondary | ICD-10-CM

## 2022-01-31 MED ORDER — METFORMIN HCL ER 500 MG PO TB24
ORAL_TABLET | ORAL | 3 refills | Status: DC
  Filled 2022-01-31: qty 360, 90d supply, fill #0
  Filled 2022-07-24: qty 360, 90d supply, fill #1
  Filled 2022-11-05: qty 360, 90d supply, fill #2

## 2022-02-01 ENCOUNTER — Other Ambulatory Visit (HOSPITAL_COMMUNITY): Payer: Self-pay

## 2022-02-28 DIAGNOSIS — M545 Low back pain, unspecified: Secondary | ICD-10-CM | POA: Diagnosis not present

## 2022-02-28 DIAGNOSIS — M4316 Spondylolisthesis, lumbar region: Secondary | ICD-10-CM | POA: Diagnosis not present

## 2022-03-08 ENCOUNTER — Other Ambulatory Visit: Payer: Self-pay | Admitting: Adult Health

## 2022-03-08 ENCOUNTER — Other Ambulatory Visit (HOSPITAL_COMMUNITY): Payer: Self-pay

## 2022-03-08 DIAGNOSIS — I10 Essential (primary) hypertension: Secondary | ICD-10-CM

## 2022-03-08 MED ORDER — HYDROCHLOROTHIAZIDE 25 MG PO TABS
ORAL_TABLET | ORAL | 1 refills | Status: DC
  Filled 2022-03-08: qty 90, 90d supply, fill #0
  Filled 2022-04-27 – 2022-05-15 (×2): qty 90, 90d supply, fill #1

## 2022-03-28 DIAGNOSIS — M4316 Spondylolisthesis, lumbar region: Secondary | ICD-10-CM | POA: Diagnosis not present

## 2022-03-28 DIAGNOSIS — M545 Low back pain, unspecified: Secondary | ICD-10-CM | POA: Diagnosis not present

## 2022-04-18 DIAGNOSIS — M4316 Spondylolisthesis, lumbar region: Secondary | ICD-10-CM | POA: Diagnosis not present

## 2022-04-18 DIAGNOSIS — M545 Low back pain, unspecified: Secondary | ICD-10-CM | POA: Diagnosis not present

## 2022-04-27 ENCOUNTER — Other Ambulatory Visit (HOSPITAL_COMMUNITY): Payer: Self-pay

## 2022-04-30 ENCOUNTER — Other Ambulatory Visit (HOSPITAL_COMMUNITY): Payer: Self-pay

## 2022-05-02 ENCOUNTER — Ambulatory Visit (INDEPENDENT_AMBULATORY_CARE_PROVIDER_SITE_OTHER): Payer: PPO

## 2022-05-02 ENCOUNTER — Other Ambulatory Visit: Payer: Self-pay

## 2022-05-02 ENCOUNTER — Ambulatory Visit (INDEPENDENT_AMBULATORY_CARE_PROVIDER_SITE_OTHER): Payer: PPO | Admitting: Orthopaedic Surgery

## 2022-05-02 DIAGNOSIS — G8929 Other chronic pain: Secondary | ICD-10-CM

## 2022-05-02 DIAGNOSIS — M1712 Unilateral primary osteoarthritis, left knee: Secondary | ICD-10-CM

## 2022-05-02 DIAGNOSIS — Z96641 Presence of right artificial hip joint: Secondary | ICD-10-CM | POA: Diagnosis not present

## 2022-05-02 DIAGNOSIS — M25562 Pain in left knee: Secondary | ICD-10-CM | POA: Diagnosis not present

## 2022-05-02 NOTE — Progress Notes (Addendum)
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  He has been dealing with left knee pain for some time now.  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  His left hip is bothering him a little bit as well.  We have not x-rayed anything in a while so we will to take x-rays of his pelvis today to see his right total hip arthroplasty as well as look at his left hip and x-rays of his left knee today.  He has tried activity modification and he continues to have some worsening pain slowly with time.  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  Examination of his right operative hip shows a move smoothly and fluidly.  His left hip actually has some stiffness with internal and external rotation as well as pain in the groin with rotation.  His right knee is normal.  His left knee shows some patellofemoral crepitation with slight varus malalignment that is correctable.  He is ligamentously stable with good range of motion and possibly just a mild effusion.  There is medial joint line tenderness of the left knee and instability on varus or valgus stressing of the knee but he does have varus malalignment that is again correctable.  An AP pelvis shows a well-seated right total hip arthroplasty that is bone ingrown.  The left hip x-ray shows significantly worsening arthritis when compared to films from 2020.  There is now superior lateral joint space narrowing with bone-on-bone wear that area.  X-rays of his left knee show moderate tricompartment arthritis with slight varus malalignment and medial joint space narrowing.  There is significant patellofemoral arthritic changes and osteophytes in all 3 compartments.  For now I have recommended an medial offloading osteoarthritis brace for his right knee since he has varus malalignment that is slight  but correctable.  This can take pressure off the medial aspect of that knee.  He is interested in trying this type of brace for his knee.  I believe this brace is medically necessary since he does have varus malalignment of his left knee that is correctable and will thus be correctable with an osteoarthritis brace to address the medial compartment arthritis of the left knee.  This brace will address the instability from varus malalignment of the knee.  We will see if we can get this ordered for him.  All question concerns were answered and addressed.

## 2022-05-15 ENCOUNTER — Other Ambulatory Visit (HOSPITAL_COMMUNITY): Payer: Self-pay

## 2022-05-15 DIAGNOSIS — M1712 Unilateral primary osteoarthritis, left knee: Secondary | ICD-10-CM | POA: Insufficient documentation

## 2022-05-22 ENCOUNTER — Other Ambulatory Visit: Payer: Self-pay | Admitting: Internal Medicine

## 2022-05-22 ENCOUNTER — Other Ambulatory Visit: Payer: Self-pay | Admitting: Surgery

## 2022-05-22 ENCOUNTER — Ambulatory Visit
Admission: RE | Admit: 2022-05-22 | Discharge: 2022-05-22 | Disposition: A | Discharge status: Home / Self Care | Payer: PPO | Source: Ambulatory Visit | Attending: Surgery | Admitting: Surgery

## 2022-05-22 ENCOUNTER — Other Ambulatory Visit (HOSPITAL_COMMUNITY): Payer: Self-pay

## 2022-05-22 DIAGNOSIS — R1084 Generalized abdominal pain: Secondary | ICD-10-CM

## 2022-05-22 DIAGNOSIS — R1032 Left lower quadrant pain: Secondary | ICD-10-CM | POA: Insufficient documentation

## 2022-05-22 DIAGNOSIS — I700 Atherosclerosis of aorta: Secondary | ICD-10-CM | POA: Diagnosis not present

## 2022-05-22 LAB — POCT I-STAT CREATININE: Creatinine, Ser: 1.2 mg/dL (ref 0.61–1.24)

## 2022-05-22 MED ORDER — AMOXICILLIN-POT CLAVULANATE 500-125 MG PO TABS
ORAL_TABLET | ORAL | 1 refills | Status: DC
  Filled 2022-05-22: qty 42, 14d supply, fill #0
  Filled 2022-07-24: qty 42, 14d supply, fill #1

## 2022-05-22 MED ORDER — IOHEXOL 300 MG/ML  SOLN
100.0000 mL | Freq: Once | INTRAMUSCULAR | Status: AC | PRN
  Administered 2022-05-22: 100 mL via INTRAVENOUS

## 2022-05-29 NOTE — Progress Notes (Unsigned)
[place] [facility] Assessment:   Harold Davis was seen today for medicare wellness.  Diagnoses and all orders for this visit:  Essential hypertension Continue HCTZ 25 mg QD Monitor blood pressure at home; call if consistently over 130/80 Continue DASH diet.   Reminder to go to the ER if any CP, SOB, nausea, dizziness, severe HA, changes vision/speech, left arm numbness and tingling and jaw pain.  Sigmoid diverticulosis Continue lifestyle modification; consider adding daily fiber supplement Had a recent flare, continue to monitor  Other abnormal glucose (prediabetes) Discussed disease and risks Discussed diet/exercise, weight management  - A1c  Left knee osteoarthritis Continue brace, stretching exercises Monitor and if worsens will follow with orthopedics  Lumbar pain with radiation down left leg Continue antigravity tilt table Continue stretching Avoid heavy lifting Follow with ortho as needed  Morbid obesity - BMI 30+ with OSA Long discussion about weight loss, diet, and exercise Recommended diet heavy in fruits and veggies and low in animal meats, cheeses, and dairy products, appropriate calorie intake Discussed appropriate weight for height  Follow up at next visit  Hyperlipidemia, unspecified hyperlipidemia type Mild elevations not requiring statin therapy; lifestyle only at this time Continue low cholesterol diet and exercise.  Monitor routinely -Lipid panel - TSH  Vitamin D deficiency Continue supplementation for goal of 60-100 Defer vitamin D level  Medication Management Continued   A. Fib with controlled ventricular response (HCC) Discussed chadsvasc score of 2; 2.9% risk of clot/CVA, would recommend anticoagulation, Dr. Caryl Comes recommended CV Patient has considered at length, states he understands risks (patient is surgeon/MD) and has decided to NOT PURSUE He monitors via apple watch, taking ASA 81. Understands this is not proven to reduce risk of a. 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 Ninfa Linden in 01/2019 and has done well since (mild residual numbness of the thigh).   MRI of spine 12/2021- L4-5 compression. He had previous sciatica of left leg. Sciatica has resolved but having pain in the right knee.  Wears a patellar brace.  Also starting to get left hip osteoarthritis. Mobility and strength have slightly decreased in lower extremities. He is using zero gravity machine.  He was found to be in asymptomatic persistent a. Fib at preop screening, ECHO 02/15/2019 showed Persistent Afib, Normal Heart Valves, Normal Ejection Fraction (60-65%), mild -moderate Atrial dilation consequent of the Afib and was referred to cardiology, saw Dr. Caryl Comes who transitioned to apixoban 5 mg BID (Cha2ds2-vasc of 2),  now only taking a baby ASA. Sleep study by Dr. Radford Pax which found mod/severe OSA and was initiated on CPAP, reports is trying to increase use, has been using using intermittently, using very infrequently  Patient preferred to avoid NOAC (has seen numerous complications with patients personally), and today shares he has considered risk  of CVA (2.9% per chadsvasc score), after understanding risks, has decided wouldn't pursue aggressive treatment either way. Declines follow up with cardiology, would not  pursue DCCV, declines NOAC. Tracking with apple watch.    CBC trending down from last year, recently reported black stools after taking iburprofen/prednisone for "gout" attack, With hx/o Colon Ca in both parents and last Colon in 2006, had negative and colonoscopy 12/10/2019 Dr Cristina Gong for colonoscopy which showed 1 polyp and widespread colonic diverticuloses.  Most recent CBC was WNL. He had diverticulitis flare in the past week. Was doing liquids the previous weeks.   BMI is Body mass index is 33.81 kg/m., he has been working on diet and exercise. 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  Marland Kitchen He was down to 250. Wt Readings from Last 3 Encounters:  05/30/22 251 lb (113.9 kg)  11/19/21 263 lb (119.3 kg)  02/15/21 264 lb 12.8 oz (120.1 kg)   His blood pressure has been controlled at home by HCTZ 25 mg daily, today their BP is BP: 102/60 BP Readings from Last 3 Encounters:  05/30/22 102/60  11/19/21 130/72  02/15/21 116/76    He does workout. He denies chest pain, shortness of breath, dizziness.   He is not on cholesterol medication and denies myalgias. His cholesterol is not at goal, mild LDL elevation The cholesterol last visit was:   Lab Results  Component Value Date   CHOL 157 11/19/2021   HDL 37 (L) 11/19/2021   LDLCALC 102 (H) 11/19/2021   TRIG 86 11/19/2021   CHOLHDL 4.2 11/19/2021    He has been working on diet and exercise for prediabetes, and denies increased appetite, nausea, paresthesia of the feet, polydipsia, polyuria and visual disturbances. He is taking Metformin 500 mg BID Last A1C in the office was:  Lab Results  Component Value Date   HGBA1C 5.9 (H) 11/19/2021   Last GFR Lab Results  Component Value Date   EGFR 66 11/19/2021    Patient is on Vitamin D supplement, taking 10000 IU daily:  Lab Results  Component Value Date   VD25OH 52 02/15/2021      He has not had  a recent gout flare. Continues to take allopurinol daily Lab Results  Component Value Date   LABURIC 6.2 11/19/2021     Medication Review:   Current Outpatient Medications (Endocrine & Metabolic):    metFORMIN (GLUCOPHAGE-XR) 500 MG 24 hr tablet, Take 2 tablets  by mouth 2 times a day with meals for diabetes (Patient not taking: Reported on 05/30/2022)  Current Outpatient Medications (Cardiovascular):    hydrochlorothiazide (HYDRODIURIL) 25 MG tablet, TAKE 1 TABLET BY MOUTH DAILY FOR BLOOD PRESSURE AND FLUID RETENTION (Patient not taking: Reported on 05/30/2022)   Current Outpatient Medications (Analgesics):    aspirin EC 81 MG tablet, Takes 1 tablet Daily   allopurinol (ZYLOPRIM) 300 MG tablet, TAKE 1 TABLET BY MOUTH ONCE DAILY TO TO PREVENT GOUT (Patient not taking: Reported on 05/30/2022)   Current Outpatient Medications (Other):    amoxicillin-clavulanate (AUGMENTIN) 500-125 MG tablet, Take 1 tablet by mouth  3 times a day  with Meals  for Diverticulitis   Cholecalciferol (VITAMIN D3) 10000 units TABS, Take 10,000 Units by mouth daily.   LECITHIN PO, Take 1 tablet by mouth daily.    MAGNESIUM PO, Take by mouth. PRN   Menaquinone-7 (K2 PO), Take by mouth. PRN  VITAMIN A PO, Take by mouth.   vitamin E 1000 UNIT capsule, Take 1 capsule (1,000 Units total) by mouth daily.   zinc gluconate 50 MG tablet, Takes 1 tablet every other day (Patient taking differently: Take 50 mg by mouth daily.)  Allergies: No Known Allergies  Current Problems (verified) has Essential hypertension; Sigmoid diverticulosis; Morbid obesity ([place]) - BMI 30+ with OSA; Hyperlipidemia, mixed; Vitamin D deficiency; Atrial fibrillation with controlled ventricular response ([place]); OSA on CPAP; Gout; and Unilateral primary osteoarthritis, left knee on their problem list.  Screening Tests Immunization History  Administered Date(s) Administered   Influenza-Unspecified 06/30/2020, 07/05/2021   PFIZER(Purple  Top)SARS-COV-2 Vaccination 09/19/2019, 10/07/2019   Tdap 03/12/2021    Preventative care: Last colonoscopy: last 11/2019, getting every 5 years by Dr. Cristina Gong, diverticulosis and 1 polyp (benign)  Prior vaccinations: TD or Tdap: 2005 declines   Influenza: 2020 via hospital   Pneumococcal: declines  Prevnar13: declines Shingles/Zostavax: declines shingrix  Covid 19: 2/2, 2021 [place] of Other Physician/Practitioners you currently use: 1. [facility] here for primary care 2. Dr. Kathrin Penner, eye doctor, last visit 2021, needs to follow up   3. Dr. 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 Family His family history includes Cancer in his brother, father, mother, and sister; Heart disease in his brother; Stroke in his mother. Social history  He reports that he has never smoked. He has never used smokeless tobacco. He reports current alcohol use. He reports that he does not use drugs.     Objective:   Today's Vitals   05/30/22 1559  BP: 102/60  Pulse: 67  Temp: (!) 97.5 F (36.4 C)  SpO2: 97%  Weight: 251 lb (113.9 kg)  Height: 6' 0.25" (1.835 m)     Body mass index is 33.81 kg/m.  Eyes: PERRLA, EOMs, conjunctiva no swelling or erythema Sinuses: No frontal/maxillary tenderness ENT/Mouth: EACs patent / TMs  nl. Nares clear without erythema, swelling, mucoid exudates. Oral hygiene is good. No erythema, swelling, or exudate. Tongue normal, non-obstructing. Tonsils cavernous,  not swollen or erythematous. Hearing normal.  Neck: Supple, thyroid not palpable. No bruits, nodes or JVD. Respiratory: Respiratory effort normal.  BS equal and clear bilateral without rales, rhonci, wheezing or stridor. Cardio: Heart sounds  irregularly irregular and no murmurs. Peripheral pulses are normal and equal bilaterally without edema/ PP 2-3(+) bilateral.   Chest: symmetric with normal excursions and percussion.  Abdomen: Soft, with Nl bowel sounds. Nontender, no guarding, rebound, hernias, masses, or organomegaly.  Lymphatics: Non tender without lymphadenopathy.  Musculoskeletal: Full ROM all peripheral extremities, joint stability, 5/5 strength, and slight antalgic gait. Skin: Warm and dry . Small red area on left cheek where previous wart was removed  Neuro: Cranial nerves intact, reflexes diminished left patellar normal right.  Normal muscle tone, no cerebellar symptoms. Sensation intact Pysch: Alert and oriented X 3 with normal affect, insight and judgment appropriate.     Alycia Rossetti, NP   05/30/2022

## 2022-05-30 ENCOUNTER — Ambulatory Visit (INDEPENDENT_AMBULATORY_CARE_PROVIDER_SITE_OTHER): Payer: PPO | Admitting: Nurse Practitioner

## 2022-05-30 ENCOUNTER — Encounter: Payer: Self-pay | Admitting: Nurse Practitioner

## 2022-05-30 VITALS — BP 102/60 | HR 67 | Temp 97.5°F | Ht 72.25 in | Wt 251.0 lb

## 2022-05-30 DIAGNOSIS — M545 Low back pain, unspecified: Secondary | ICD-10-CM | POA: Diagnosis not present

## 2022-05-30 DIAGNOSIS — M1712 Unilateral primary osteoarthritis, left knee: Secondary | ICD-10-CM

## 2022-05-30 DIAGNOSIS — Z79899 Other long term (current) drug therapy: Secondary | ICD-10-CM

## 2022-05-30 DIAGNOSIS — I4891 Unspecified atrial fibrillation: Secondary | ICD-10-CM | POA: Diagnosis not present

## 2022-05-30 DIAGNOSIS — K5730 Diverticulosis of large intestine without perforation or abscess without bleeding: Secondary | ICD-10-CM

## 2022-05-30 DIAGNOSIS — E790 Hyperuricemia without signs of inflammatory arthritis and tophaceous disease: Secondary | ICD-10-CM | POA: Diagnosis not present

## 2022-05-30 DIAGNOSIS — R7309 Other abnormal glucose: Secondary | ICD-10-CM | POA: Diagnosis not present

## 2022-05-30 DIAGNOSIS — M79605 Pain in left leg: Secondary | ICD-10-CM

## 2022-05-30 DIAGNOSIS — E782 Mixed hyperlipidemia: Secondary | ICD-10-CM | POA: Diagnosis not present

## 2022-05-30 DIAGNOSIS — I10 Essential (primary) hypertension: Secondary | ICD-10-CM | POA: Diagnosis not present

## 2022-05-30 DIAGNOSIS — E559 Vitamin D deficiency, unspecified: Secondary | ICD-10-CM

## 2022-05-30 NOTE — Patient Instructions (Signed)

## 2022-05-31 DIAGNOSIS — M1712 Unilateral primary osteoarthritis, left knee: Secondary | ICD-10-CM | POA: Diagnosis not present

## 2022-05-31 LAB — COMPLETE METABOLIC PANEL WITH GFR
AG Ratio: 1.8 (calc) (ref 1.0–2.5)
ALT: 30 U/L (ref 9–46)
AST: 26 U/L (ref 10–35)
Albumin: 4.5 g/dL (ref 3.6–5.1)
Alkaline phosphatase (APISO): 65 U/L (ref 35–144)
BUN: 13 mg/dL (ref 7–25)
CO2: 28 mmol/L (ref 20–32)
Calcium: 9.3 mg/dL (ref 8.6–10.3)
Chloride: 102 mmol/L (ref 98–110)
Creat: 1.14 mg/dL (ref 0.70–1.35)
Globulin: 2.5 g/dL (calc) (ref 1.9–3.7)
Glucose, Bld: 74 mg/dL (ref 65–99)
Potassium: 3.8 mmol/L (ref 3.5–5.3)
Sodium: 140 mmol/L (ref 135–146)
Total Bilirubin: 0.5 mg/dL (ref 0.2–1.2)
Total Protein: 7 g/dL (ref 6.1–8.1)
eGFR: 70 mL/min/{1.73_m2} (ref >=60)

## 2022-05-31 LAB — CBC WITH DIFFERENTIAL/PLATELET
Absolute Monocytes: 574 cells/uL (ref 200–950)
Basophils Absolute: 58 cells/uL (ref 0–200)
Basophils Relative: 1 %
Eosinophils Absolute: 249 cells/uL (ref 15–500)
Eosinophils Relative: 4.3 %
HCT: 41.9 % (ref 38.5–50.0)
Hemoglobin: 14.1 g/dL (ref 13.2–17.1)
Lymphs Abs: 1717 cells/uL (ref 850–3900)
MCH: 31.2 pg (ref 27.0–33.0)
MCHC: 33.7 g/dL (ref 32.0–36.0)
MCV: 92.7 fL (ref 80.0–100.0)
MPV: 10.8 fL (ref 7.5–12.5)
Monocytes Relative: 9.9 %
Neutro Abs: 3202 cells/uL (ref 1500–7800)
Neutrophils Relative %: 55.2 %
Platelets: 329 10*3/uL (ref 140–400)
RBC: 4.52 10*6/uL (ref 4.20–5.80)
RDW: 13.2 % (ref 11.0–15.0)
Total Lymphocyte: 29.6 %
WBC: 5.8 10*3/uL (ref 3.8–10.8)

## 2022-05-31 LAB — HEMOGLOBIN A1C
Hgb A1c MFr Bld: 5.7 % of total Hgb — ABNORMAL HIGH (ref <5.7)
Mean Plasma Glucose: 117 mg/dL
eAG (mmol/L): 6.5 mmol/L

## 2022-05-31 LAB — LIPID PANEL
Cholesterol: 145 mg/dL (ref <200)
HDL: 33 mg/dL — ABNORMAL LOW (ref >=40)
LDL Cholesterol (Calc): 91 mg/dL (calc)
Non-HDL Cholesterol (Calc): 112 mg/dL (calc) (ref <130)
Total CHOL/HDL Ratio: 4.4 (calc) (ref <5.0)
Triglycerides: 114 mg/dL (ref <150)

## 2022-07-24 ENCOUNTER — Other Ambulatory Visit: Payer: Self-pay

## 2022-07-25 ENCOUNTER — Other Ambulatory Visit (HOSPITAL_COMMUNITY): Payer: Self-pay

## 2022-07-27 ENCOUNTER — Other Ambulatory Visit (HOSPITAL_COMMUNITY): Payer: Self-pay

## 2022-08-09 ENCOUNTER — Other Ambulatory Visit (HOSPITAL_COMMUNITY): Payer: Self-pay

## 2022-08-12 ENCOUNTER — Other Ambulatory Visit (HOSPITAL_COMMUNITY): Payer: Self-pay

## 2022-08-19 ENCOUNTER — Other Ambulatory Visit (HOSPITAL_COMMUNITY): Payer: Self-pay

## 2022-08-26 ENCOUNTER — Other Ambulatory Visit: Payer: Self-pay

## 2022-08-26 ENCOUNTER — Other Ambulatory Visit: Payer: Self-pay | Admitting: Adult Health

## 2022-08-26 ENCOUNTER — Other Ambulatory Visit (HOSPITAL_COMMUNITY): Payer: Self-pay

## 2022-08-26 ENCOUNTER — Other Ambulatory Visit (HOSPITAL_BASED_OUTPATIENT_CLINIC_OR_DEPARTMENT_OTHER): Payer: Self-pay

## 2022-08-26 DIAGNOSIS — I10 Essential (primary) hypertension: Secondary | ICD-10-CM

## 2022-08-26 MED ORDER — HYDROCHLOROTHIAZIDE 25 MG PO TABS
25.0000 mg | ORAL_TABLET | Freq: Every day | ORAL | 11 refills | Status: DC
  Filled 2022-08-26: qty 30, 30d supply, fill #0
  Filled 2022-10-02: qty 30, 30d supply, fill #1
  Filled 2022-11-05: qty 30, 30d supply, fill #2

## 2022-08-27 DIAGNOSIS — H3323 Serous retinal detachment, bilateral: Secondary | ICD-10-CM | POA: Diagnosis not present

## 2022-10-02 ENCOUNTER — Other Ambulatory Visit: Payer: Self-pay

## 2022-10-03 ENCOUNTER — Other Ambulatory Visit: Payer: Self-pay

## 2022-10-07 ENCOUNTER — Other Ambulatory Visit (HOSPITAL_COMMUNITY): Payer: Self-pay

## 2022-10-28 DIAGNOSIS — R490 Dysphonia: Secondary | ICD-10-CM | POA: Diagnosis not present

## 2022-10-28 DIAGNOSIS — J3801 Paralysis of vocal cords and larynx, unilateral: Secondary | ICD-10-CM | POA: Diagnosis not present

## 2022-10-28 DIAGNOSIS — H9202 Otalgia, left ear: Secondary | ICD-10-CM | POA: Diagnosis not present

## 2022-10-29 ENCOUNTER — Other Ambulatory Visit: Payer: Self-pay | Admitting: Otolaryngology

## 2022-10-29 ENCOUNTER — Other Ambulatory Visit: Payer: Self-pay | Admitting: Internal Medicine

## 2022-10-29 DIAGNOSIS — J3801 Paralysis of vocal cords and larynx, unilateral: Secondary | ICD-10-CM

## 2022-10-31 ENCOUNTER — Ambulatory Visit
Admission: RE | Admit: 2022-10-31 | Discharge: 2022-10-31 | Disposition: A | Discharge status: Home / Self Care | Payer: PPO | Source: Ambulatory Visit | Attending: Otolaryngology | Admitting: Otolaryngology

## 2022-10-31 DIAGNOSIS — J3801 Paralysis of vocal cords and larynx, unilateral: Secondary | ICD-10-CM

## 2022-10-31 DIAGNOSIS — G500 Trigeminal neuralgia: Secondary | ICD-10-CM | POA: Diagnosis not present

## 2022-10-31 DIAGNOSIS — J341 Cyst and mucocele of nose and nasal sinus: Secondary | ICD-10-CM | POA: Diagnosis not present

## 2022-10-31 MED ORDER — IOPAMIDOL (ISOVUE-300) INJECTION 61%
75.0000 mL | Freq: Once | INTRAVENOUS | Status: AC | PRN
  Administered 2022-10-31: 75 mL via INTRAVENOUS

## 2022-11-18 NOTE — Progress Notes (Unsigned)
MEDICARE ANNUAL WELLNESS VISIT AND FOLLOW UP Assessment:   Harold Davis was seen today for medicare wellness.  Diagnoses and all orders for this visit:  Encounter for Medicare annual wellness exam He declines tetanus booster, pneumonia vaccines, shingrix  Getting flu vaccine annually via work Encouraged to schedule follow up with vision and dental providers   Essential hypertension Continue HCTZ 25 mg QD Monitor blood pressure at home; call if consistently over 130/80 Continue DASH diet.   Reminder to go to the ER if any CP, SOB, nausea, dizziness, severe HA, changes vision/speech, left arm numbness and tingling and jaw pain.  Sigmoid diverticulosis Continue lifestyle modification; consider adding daily fiber supplement  Other abnormal glucose (prediabetes) Discussed disease and risks Discussed diet/exercise, weight management  Continue Metformin 500 mg QD - A1c  Morbid obesity - BMI 30+ with OSA Long discussion about weight loss, diet, and exercise Recommended diet heavy in fruits and veggies and low in animal meats, cheeses, and dairy products, appropriate calorie intake Discussed appropriate weight for height  Follow up at next visit  Hyperlipidemia, unspecified hyperlipidemia type Mild elevations not requiring statin therapy; lifestyle only at this time Continue low cholesterol diet and exercise.  Monitor routinely -Lipid panel - TSH  Vitamin D deficiency Continue supplementation for goal of 60-100 Defer vitamin D level  Anemia  - Colonoscopy per Dr. Cristina Gong with numerous diverticuloses - resolved on CBC at work 3/24, recheck CBC  Abnormal serum uric acid  Has adjusted lifestyle; will restart allopurinol Recheck uric acid  S/p R hip THA Doing well without complications  Left hip pain Continue to follow with orthopedics  A. Fib with controlled ventricular response (HCC) Discussed chadsvasc score of 2; 2.9% risk of clot/CVA, would recommend anticoagulation,  Dr. 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 Understands this is not proven to reduce risk of a. 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     Plan:   During the course of the visit the patient was educated and counseled about appropriate screening and preventive services including:   Pneumococcal vaccine  Influenza vaccine Prevnar 13 Td vaccine Screening electrocardiogram Colorectal cancer screening Diabetes screening Glaucoma screening Nutrition counseling    Subjective:  Harold Davis, Harold Davis is a 72 y.o. male who presents for Medicare Annual Wellness Visit and 3 month follow up for HTN, hyperlipidemia, prediabetes, and vitamin D Def.   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 Ninfa Linden in 01/2019 and has done well since. Continues to have left hip pain.   3 weeks ago his jaw on the left side was painful.  Hot and cold would make pain worse.  Dentist evaluated and no evidence of tooth cause.  Dr. Benjamine Mola believed trigeminal neuralgia, left vocal cord paralyzed, CT scan normal  He was found to be in asymptomatic persistent a. Fib at preop screening, ECHO 02/15/2019 showed Persistent Afib, Normal Heart Valves, Normal Ejection Fraction (60-65%), mild -moderate Atrial dilation consequent of the Afib and was referred to cardiology, saw Dr. Caryl Comes who transitioned to  apixoban 5 mg BID (Cha2ds2-vasc of 2),  now only taking a baby ASA.  Sleep study by Dr. Radford Pax which found mod/severe OSA and was initiated on CPAP, reports is  trying to increase use, has been using using intermittently, using very infrequently  Patient preferred to avoid NOAC (has seen numerous complications with patients personally), and today shares he has considered risk of CVA (2.9% per chadsvasc score), after understanding risks, has decided wouldn't pursue aggressive treatment either way. Declines follow up with cardiology, would not pursue DCCV, declines NOAC. Tracking with apple watch.   He has had tinnitus since Covid.  Continues to be an issue.    He does not take allopurinol daily for gout prevention and will use colchicine with flares. He reports has been avoiding red meat, no recurrent sx since that time -  Lab Results  Component Value Date   LABURIC 6.2 11/19/2021    BMI is Body mass index is 34.72 kg/m., he has been working on diet and exercise. 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 He is limiting simple carbs and higher protein- he is on sabbatical for 4 weeks Wt Readings from Last 3 Encounters:  11/19/22 259 lb 9.6 oz (117.8 kg)  05/30/22 251 lb (113.9 kg)  11/19/21 263 lb (119.3 kg)   His blood pressure has been controlled at home by HCTZ 25 mg daily, today their BP is BP: 108/72 BP Readings from Last 3 Encounters:  11/19/22 108/72  05/30/22 102/60  11/19/21 130/72   He does workout. He denies chest pain, shortness of breath, dizziness.   He is not on cholesterol medication and denies myalgias. His cholesterol is not at goal, mild LDL elevation  The cholesterol last visit was:   Lab Results  Component Value Date   CHOL 145 05/30/2022   HDL 33 (L) 05/30/2022   LDLCALC 91 05/30/2022   TRIG 114 05/30/2022   CHOLHDL 4.4 05/30/2022    He has been working on diet and exercise for prediabetes, and denies increased appetite, nausea, paresthesia of the feet, polydipsia, polyuria and visual disturbances. He is taking Metformin 500 mg  1 tab daily Last A1C in the office was:  Lab Results  Component Value Date   HGBA1C 5.7 (H) 05/30/2022   Last GFR Lab Results  Component Value Date   EGFR 70 05/30/2022    Patient is on Vitamin D supplement, taking 10000 IU daily:  Lab Results  Component Value Date   VD25OH 54 02/15/2021        Medication Review:   Current Outpatient Medications (Endocrine & Metabolic):    metFORMIN (GLUCOPHAGE-XR) 500 MG 24 hr tablet, Take 2 tablets  by mouth 2 times a day with meals for diabetes (Patient taking differently: Take 2 tablets  by mouth 2 times a day with meals for diabetes)  Current Outpatient Medications (Cardiovascular):    hydrochlorothiazide (HYDRODIURIL) 25 MG tablet, Take 1 tablet (25 mg total) by mouth daily for blood pressure and fluid retention.   Current Outpatient Medications (Analgesics):    aspirin EC 81 MG tablet, Takes 1 tablet Daily   allopurinol (ZYLOPRIM) 300 MG tablet, TAKE 1 TABLET BY MOUTH ONCE DAILY TO TO PREVENT GOUT (Patient not taking: Reported on 05/30/2022)   Current Outpatient Medications (Other):    Ascorbic Acid (VITAMIN C) 1000 MG tablet, Take 1,000 mg by mouth daily.   Cholecalciferol (VITAMIN D3) 10000  units TABS, Take 10,000 Units by mouth daily.   LECITHIN PO, Take 1 tablet by mouth daily.    MAGNESIUM PO, Take by mouth. PRN   Omega-3 Fatty Acids (FISH OIL PO), Take by mouth.   VITAMIN A PO, Take by mouth.   vitamin E 1000 UNIT capsule, Take 1 capsule (1,000 Units total) by mouth daily.   zinc gluconate 50 MG tablet, Takes 1 tablet every other day (Patient taking differently: Take 50 mg by mouth daily.)   Menaquinone-7 (K2 PO), Take by mouth. PRN (Patient not taking: Reported on 11/19/2022)  Allergies: No Known Allergies  Current Problems (verified) has Essential hypertension; Sigmoid diverticulosis; Morbid obesity ([place]) - BMI 30+ with OSA; Hyperlipidemia, mixed; Vitamin D deficiency; Atrial fibrillation with controlled ventricular  response ([place]); OSA on CPAP; Gout; and Unilateral primary osteoarthritis, left knee on their problem list.  Screening Tests Immunization History  Administered Date(s) Administered   Influenza-Unspecified 06/30/2020, 07/05/2021   PFIZER(Purple Top)SARS-COV-2 Vaccination 09/19/2019, 10/07/2019   Tdap 03/12/2021    Preventative care: Last colonoscopy: last 11/2019, getting every 5 years by Dr. Cristina Gong, diverticulosis and 1 polyp (benign)  Prior vaccinations: TD or Tdap: 2005 declines   Influenza: 2020 via hospital   Pneumococcal: declines  Prevnar13: declines Shingles/Zostavax: declines shingrix  Covid 19: 2/2, 2021 [place] of Other Physician/Practitioners you currently use: 1. [facility] here for primary care 2. Dr. Kathrin Penner, eye doctor, last visit 2021, needs to follow up   3. Dr. 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 Family His family history includes Cancer in his brother, father, mother, and sister; Heart disease in his brother; Stroke in his mother. Social history  He reports that he has never smoked. He has never used smokeless tobacco. He reports current alcohol use. He reports that he does not use drugs.  MEDICARE WELLNESS OBJECTIVES: Physical activity:   Cardiac risk factors: Cardiac Risk Factors include: advanced age (>63mn, >>50women);dyslipidemia;hypertension;obesity (BMI >30kg/m2) Depression/mood screen:      11/19/2022    3:40 PM  Depression screen PHQ 2/9  Decreased Interest 0  Down, Depressed, Hopeless 0  PHQ - 2 Score 0    ADLs:     11/19/2022    3:40 PM  In your present state of health, do you have any difficulty performing the following activities:  Hearing? 0  Vision? 0   Difficulty concentrating or making decisions? 0  Walking or climbing stairs? 0  Dressing or bathing? 0  Doing errands, shopping? 0      Cognitive Testing  Alert? Yes  Normal Appearance?Yes  Oriented to person? Yes  Place? Yes   Time? Yes  Recall of three objects?  Yes  Can perform simple calculations? Yes  Displays appropriate judgment?Yes  Can read the correct time from a watch face?Yes  EOL planning: Does Patient Have a Medical Advance Directive?: Yes Type of Advance Directive: Healthcare Power of Attorney, Living will Does patient want to make changes to medical advance directive?: No - Patient declined Copy of [place] Chart?: No - copy requested   Objective:   Today's Vitals   11/19/22 1459  BP: 108/72  Pulse: 77  Temp: 97.7 F (36.5 C)  SpO2: 98%  Weight: 259 lb 9.6 oz (117.8 kg)  Height: 6' 0.5" (1.842 m)     Body mass index is 34.72 kg/m.  Eyes: PERRLA, EOMs, conjunctiva no swelling or erythema Sinuses: No frontal/maxillary tenderness ENT/Mouth: EACs patent / TMs  nl. Nares clear without erythema, swelling, mucoid exudates. Oral hygiene is good. No erythema, swelling, or exudate. Tongue normal, non-obstructing.  Hearing normal.  Neck: Supple, thyroid not palpable. No bruits, nodes or JVD. Respiratory: Respiratory effort normal.  BS equal and clear bilateral without rales, rhonci, wheezing or stridor. Cardio: Heart sounds irregularly irregular and no murmurs. Peripheral pulses are normal and equal bilaterally without edema/ PP 2-3(+) bilateral.   Chest: symmetric with normal excursions and percussion.  Abdomen: Soft, with Nl bowel sounds. Nontender, no guarding, rebound, hernias, masses, or organomegaly.  Lymphatics: Non tender without lymphadenopathy.  Musculoskeletal: Full ROM all peripheral extremities, joint stability, 5/5 strength, and antalgic gait due to left hip pain Skin: Warm and dry .   Neuro: Cranial nerves intact, reflexes  equal bilaterally. Normal muscle tone, no cerebellar symptoms. Sensation intact/  Pysch: Alert and oriented X 3 with normal affect, insight and judgment appropriate.   Medicare Attestation I have personally reviewed: The patient's medical and social history Their use of alcohol, tobacco or illicit drugs Their current medications and supplements The patient's functional ability including ADLs,fall risks, home safety risks, cognitive, and hearing and visual impairment Diet and physical activities Evidence for depression or mood disorders  The patient's weight, height, BMI, and visual acuity have been recorded in the chart.  I have made referrals, counseling, and provided education to the patient based on review of the above and I have provided the patient with a written personalized care plan for preventive services.     Alycia Rossetti, NP   11/19/2022

## 2022-11-19 ENCOUNTER — Other Ambulatory Visit (HOSPITAL_COMMUNITY): Payer: Self-pay

## 2022-11-19 ENCOUNTER — Ambulatory Visit (INDEPENDENT_AMBULATORY_CARE_PROVIDER_SITE_OTHER): Payer: PPO | Admitting: Nurse Practitioner

## 2022-11-19 ENCOUNTER — Encounter: Payer: Self-pay | Admitting: Nurse Practitioner

## 2022-11-19 VITALS — BP 108/72 | HR 77 | Temp 97.7°F | Ht 72.5 in | Wt 259.6 lb

## 2022-11-19 DIAGNOSIS — R7309 Other abnormal glucose: Secondary | ICD-10-CM

## 2022-11-19 DIAGNOSIS — E782 Mixed hyperlipidemia: Secondary | ICD-10-CM | POA: Diagnosis not present

## 2022-11-19 DIAGNOSIS — R6889 Other general symptoms and signs: Secondary | ICD-10-CM | POA: Diagnosis not present

## 2022-11-19 DIAGNOSIS — M10079 Idiopathic gout, unspecified ankle and foot: Secondary | ICD-10-CM

## 2022-11-19 DIAGNOSIS — E559 Vitamin D deficiency, unspecified: Secondary | ICD-10-CM

## 2022-11-19 DIAGNOSIS — Z79899 Other long term (current) drug therapy: Secondary | ICD-10-CM

## 2022-11-19 DIAGNOSIS — Z96641 Presence of right artificial hip joint: Secondary | ICD-10-CM

## 2022-11-19 DIAGNOSIS — J3801 Paralysis of vocal cords and larynx, unilateral: Secondary | ICD-10-CM

## 2022-11-19 DIAGNOSIS — E790 Hyperuricemia without signs of inflammatory arthritis and tophaceous disease: Secondary | ICD-10-CM

## 2022-11-19 DIAGNOSIS — I10 Essential (primary) hypertension: Secondary | ICD-10-CM

## 2022-11-19 DIAGNOSIS — Z0000 Encounter for general adult medical examination without abnormal findings: Secondary | ICD-10-CM

## 2022-11-19 DIAGNOSIS — I4891 Unspecified atrial fibrillation: Secondary | ICD-10-CM

## 2022-11-19 DIAGNOSIS — Z0001 Encounter for general adult medical examination with abnormal findings: Secondary | ICD-10-CM | POA: Diagnosis not present

## 2022-11-19 DIAGNOSIS — M25552 Pain in left hip: Secondary | ICD-10-CM | POA: Diagnosis not present

## 2022-11-19 DIAGNOSIS — K5730 Diverticulosis of large intestine without perforation or abscess without bleeding: Secondary | ICD-10-CM | POA: Diagnosis not present

## 2022-11-19 MED ORDER — ALLOPURINOL 300 MG PO TABS
300.0000 mg | ORAL_TABLET | Freq: Every day | ORAL | 3 refills | Status: DC
  Filled 2022-11-19: qty 90, 90d supply, fill #0
  Filled 2023-02-20: qty 90, 90d supply, fill #1
  Filled 2023-05-16: qty 90, 90d supply, fill #2
  Filled 2023-09-08: qty 90, 90d supply, fill #3

## 2022-11-19 NOTE — Patient Instructions (Signed)

## 2022-11-20 ENCOUNTER — Other Ambulatory Visit: Payer: Self-pay | Admitting: Internal Medicine

## 2022-11-20 DIAGNOSIS — N182 Chronic kidney disease, stage 2 (mild): Secondary | ICD-10-CM

## 2022-11-20 DIAGNOSIS — I10 Essential (primary) hypertension: Secondary | ICD-10-CM

## 2022-11-20 DIAGNOSIS — M545 Low back pain, unspecified: Secondary | ICD-10-CM

## 2022-11-20 LAB — COMPLETE METABOLIC PANEL WITH GFR
AG Ratio: 1.6 (calc) (ref 1.0–2.5)
ALT: 24 U/L (ref 9–46)
AST: 26 U/L (ref 10–35)
Albumin: 4.7 g/dL (ref 3.6–5.1)
Alkaline phosphatase (APISO): 68 U/L (ref 35–144)
BUN/Creatinine Ratio: 14 (calc) (ref 6–22)
BUN: 23 mg/dL (ref 7–25)
CO2: 29 mmol/L (ref 20–32)
Calcium: 10 mg/dL (ref 8.6–10.3)
Chloride: 101 mmol/L (ref 98–110)
Creat: 1.65 mg/dL — ABNORMAL HIGH (ref 0.70–1.35)
Globulin: 3 g/dL (calc) (ref 1.9–3.7)
Glucose, Bld: 90 mg/dL (ref 65–99)
Potassium: 4 mmol/L (ref 3.5–5.3)
Sodium: 142 mmol/L (ref 135–146)
Total Bilirubin: 0.6 mg/dL (ref 0.2–1.2)
Total Protein: 7.7 g/dL (ref 6.1–8.1)
eGFR: 45 mL/min/{1.73_m2} — ABNORMAL LOW (ref >=60)

## 2022-11-20 LAB — CBC WITH DIFFERENTIAL/PLATELET
Absolute Monocytes: 725 cells/uL (ref 200–950)
Basophils Absolute: 67 cells/uL (ref 0–200)
Basophils Relative: 0.9 %
Eosinophils Absolute: 311 cells/uL (ref 15–500)
Eosinophils Relative: 4.2 %
HCT: 44.8 % (ref 38.5–50.0)
Hemoglobin: 15.2 g/dL (ref 13.2–17.1)
Lymphs Abs: 1835 cells/uL (ref 850–3900)
MCH: 30.8 pg (ref 27.0–33.0)
MCHC: 33.9 g/dL (ref 32.0–36.0)
MCV: 90.7 fL (ref 80.0–100.0)
MPV: 12 fL (ref 7.5–12.5)
Monocytes Relative: 9.8 %
Neutro Abs: 4462 cells/uL (ref 1500–7800)
Neutrophils Relative %: 60.3 %
Platelets: 259 10*3/uL (ref 140–400)
RBC: 4.94 10*6/uL (ref 4.20–5.80)
RDW: 13 % (ref 11.0–15.0)
Total Lymphocyte: 24.8 %
WBC: 7.4 10*3/uL (ref 3.8–10.8)

## 2022-11-20 LAB — LIPID PANEL
Cholesterol: 174 mg/dL (ref <200)
HDL: 43 mg/dL (ref >=40)
LDL Cholesterol (Calc): 109 mg/dL (calc) — ABNORMAL HIGH
Non-HDL Cholesterol (Calc): 131 mg/dL (calc) — ABNORMAL HIGH (ref <130)
Total CHOL/HDL Ratio: 4 (calc) (ref <5.0)
Triglycerides: 114 mg/dL (ref <150)

## 2022-11-20 LAB — TSH: TSH: 1.41 mIU/L (ref 0.40–4.50)

## 2022-11-20 LAB — VITAMIN D 25 HYDROXY (VIT D DEFICIENCY, FRACTURES): Vit D, 25-Hydroxy: 43 ng/mL (ref 30–100)

## 2022-11-20 LAB — URIC ACID: Uric Acid, Serum: 10.3 mg/dL — ABNORMAL HIGH (ref 4.0–8.0)

## 2022-11-27 ENCOUNTER — Other Ambulatory Visit: Payer: Self-pay | Admitting: Internal Medicine

## 2022-11-27 DIAGNOSIS — E790 Hyperuricemia without signs of inflammatory arthritis and tophaceous disease: Secondary | ICD-10-CM

## 2022-11-27 DIAGNOSIS — E1122 Type 2 diabetes mellitus with diabetic chronic kidney disease: Secondary | ICD-10-CM

## 2022-11-27 DIAGNOSIS — I10 Essential (primary) hypertension: Secondary | ICD-10-CM

## 2022-11-27 DIAGNOSIS — Z79899 Other long term (current) drug therapy: Secondary | ICD-10-CM

## 2022-11-27 DIAGNOSIS — A09 Infectious gastroenteritis and colitis, unspecified: Secondary | ICD-10-CM

## 2022-11-28 ENCOUNTER — Other Ambulatory Visit: Payer: PPO

## 2022-11-28 ENCOUNTER — Other Ambulatory Visit: Payer: Self-pay

## 2022-11-28 DIAGNOSIS — N182 Chronic kidney disease, stage 2 (mild): Secondary | ICD-10-CM | POA: Diagnosis not present

## 2022-11-28 DIAGNOSIS — Z79899 Other long term (current) drug therapy: Secondary | ICD-10-CM | POA: Diagnosis not present

## 2022-11-28 DIAGNOSIS — I10 Essential (primary) hypertension: Secondary | ICD-10-CM

## 2022-11-28 DIAGNOSIS — E790 Hyperuricemia without signs of inflammatory arthritis and tophaceous disease: Secondary | ICD-10-CM | POA: Diagnosis not present

## 2022-11-28 DIAGNOSIS — A09 Infectious gastroenteritis and colitis, unspecified: Secondary | ICD-10-CM | POA: Diagnosis not present

## 2022-11-28 DIAGNOSIS — E1122 Type 2 diabetes mellitus with diabetic chronic kidney disease: Secondary | ICD-10-CM

## 2022-11-28 LAB — CBC WITH DIFFERENTIAL/PLATELET
Absolute Monocytes: 673 cells/uL (ref 200–950)
Basophils Absolute: 31 cells/uL (ref 0–200)
Basophils Relative: 0.6 %
Eosinophils Absolute: 128 cells/uL (ref 15–500)
Eosinophils Relative: 2.5 %
HCT: 44.7 % (ref 38.5–50.0)
Hemoglobin: 14.8 g/dL (ref 13.2–17.1)
Lymphs Abs: 974 cells/uL (ref 850–3900)
MCH: 29.9 pg (ref 27.0–33.0)
MCHC: 33.1 g/dL (ref 32.0–36.0)
MCV: 90.3 fL (ref 80.0–100.0)
MPV: 11.5 fL (ref 7.5–12.5)
Monocytes Relative: 13.2 %
Neutro Abs: 3295 cells/uL (ref 1500–7800)
Neutrophils Relative %: 64.6 %
Platelets: 250 10*3/uL (ref 140–400)
RBC: 4.95 10*6/uL (ref 4.20–5.80)
RDW: 12.8 % (ref 11.0–15.0)
Total Lymphocyte: 19.1 %
WBC: 5.1 10*3/uL (ref 3.8–10.8)

## 2022-11-28 LAB — COMPLETE METABOLIC PANEL WITH GFR
AG Ratio: 1.6 (calc) (ref 1.0–2.5)
ALT: 23 U/L (ref 9–46)
AST: 26 U/L (ref 10–35)
Albumin: 4.5 g/dL (ref 3.6–5.1)
Alkaline phosphatase (APISO): 64 U/L (ref 35–144)
BUN: 16 mg/dL (ref 7–25)
CO2: 27 mmol/L (ref 20–32)
Calcium: 9.6 mg/dL (ref 8.6–10.3)
Chloride: 102 mmol/L (ref 98–110)
Creat: 1.04 mg/dL (ref 0.70–1.35)
Globulin: 2.9 g/dL (calc) (ref 1.9–3.7)
Glucose, Bld: 92 mg/dL (ref 65–99)
Potassium: 4.5 mmol/L (ref 3.5–5.3)
Sodium: 139 mmol/L (ref 135–146)
Total Bilirubin: 0.7 mg/dL (ref 0.2–1.2)
Total Protein: 7.4 g/dL (ref 6.1–8.1)
eGFR: 78 mL/min/{1.73_m2} (ref >=60)

## 2022-11-28 LAB — URIC ACID: Uric Acid, Serum: 7.9 mg/dL (ref 4.0–8.0)

## 2022-11-29 LAB — TEST AUTHORIZATION: TEST CODE:: 10165

## 2022-11-29 LAB — HEMOGLOBIN A1C
Hgb A1c MFr Bld: 5.9 % of total Hgb — ABNORMAL HIGH (ref <5.7)
Mean Plasma Glucose: 123 mg/dL
eAG (mmol/L): 6.8 mmol/L

## 2022-12-10 DIAGNOSIS — J3801 Paralysis of vocal cords and larynx, unilateral: Secondary | ICD-10-CM | POA: Diagnosis not present

## 2022-12-10 DIAGNOSIS — R490 Dysphonia: Secondary | ICD-10-CM | POA: Diagnosis not present

## 2023-02-11 ENCOUNTER — Other Ambulatory Visit (HOSPITAL_COMMUNITY): Payer: Self-pay

## 2023-02-11 ENCOUNTER — Other Ambulatory Visit: Payer: Self-pay | Admitting: Internal Medicine

## 2023-02-11 MED ORDER — DOXYCYCLINE HYCLATE 100 MG PO CAPS
ORAL_CAPSULE | ORAL | 0 refills | Status: DC
  Filled 2023-02-11: qty 60, 30d supply, fill #0

## 2023-04-08 ENCOUNTER — Ambulatory Visit (INDEPENDENT_AMBULATORY_CARE_PROVIDER_SITE_OTHER): Payer: PPO | Admitting: Internal Medicine

## 2023-04-08 ENCOUNTER — Encounter: Payer: Self-pay | Admitting: Internal Medicine

## 2023-04-08 VITALS — BP 136/80 | HR 54 | Temp 97.6°F | Resp 17 | Ht 72.05 in | Wt 239.2 lb

## 2023-04-08 DIAGNOSIS — I700 Atherosclerosis of aorta: Secondary | ICD-10-CM

## 2023-04-08 DIAGNOSIS — Z136 Encounter for screening for cardiovascular disorders: Secondary | ICD-10-CM

## 2023-04-08 DIAGNOSIS — N401 Enlarged prostate with lower urinary tract symptoms: Secondary | ICD-10-CM | POA: Diagnosis not present

## 2023-04-08 DIAGNOSIS — I10 Essential (primary) hypertension: Secondary | ICD-10-CM | POA: Diagnosis not present

## 2023-04-08 DIAGNOSIS — Z0000 Encounter for general adult medical examination without abnormal findings: Secondary | ICD-10-CM

## 2023-04-08 DIAGNOSIS — I480 Paroxysmal atrial fibrillation: Secondary | ICD-10-CM | POA: Diagnosis not present

## 2023-04-08 DIAGNOSIS — Z79899 Other long term (current) drug therapy: Secondary | ICD-10-CM | POA: Diagnosis not present

## 2023-04-08 DIAGNOSIS — Z125 Encounter for screening for malignant neoplasm of prostate: Secondary | ICD-10-CM | POA: Diagnosis not present

## 2023-04-08 DIAGNOSIS — E559 Vitamin D deficiency, unspecified: Secondary | ICD-10-CM

## 2023-04-08 DIAGNOSIS — N183 Chronic kidney disease, stage 3 unspecified: Secondary | ICD-10-CM | POA: Diagnosis not present

## 2023-04-08 DIAGNOSIS — E782 Mixed hyperlipidemia: Secondary | ICD-10-CM | POA: Diagnosis not present

## 2023-04-08 DIAGNOSIS — N138 Other obstructive and reflux uropathy: Secondary | ICD-10-CM | POA: Diagnosis not present

## 2023-04-08 DIAGNOSIS — R7309 Other abnormal glucose: Secondary | ICD-10-CM | POA: Diagnosis not present

## 2023-04-08 DIAGNOSIS — Z0001 Encounter for general adult medical examination with abnormal findings: Secondary | ICD-10-CM

## 2023-04-08 DIAGNOSIS — Z1211 Encounter for screening for malignant neoplasm of colon: Secondary | ICD-10-CM

## 2023-04-08 DIAGNOSIS — G4733 Obstructive sleep apnea (adult) (pediatric): Secondary | ICD-10-CM

## 2023-04-08 DIAGNOSIS — M1000 Idiopathic gout, unspecified site: Secondary | ICD-10-CM | POA: Diagnosis not present

## 2023-04-08 LAB — CBC WITH DIFFERENTIAL/PLATELET
Absolute Monocytes: 649 cells/uL (ref 200–950)
Eosinophils Relative: 3.8 %
HCT: 40.2 % (ref 38.5–50.0)
Hemoglobin: 13.3 g/dL (ref 13.2–17.1)
MCHC: 33.1 g/dL (ref 32.0–36.0)
MCV: 92.6 fL (ref 80.0–100.0)
Monocytes Relative: 10.3 %
Neutrophils Relative %: 63.5 %
RDW: 13 % (ref 11.0–15.0)
Total Lymphocyte: 21.5 %
WBC: 6.3 10*3/uL (ref 3.8–10.8)

## 2023-04-08 NOTE — Progress Notes (Signed)
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  Patient  has  prior hx of diverticulitis in descending & transverse colon and in Jan 2019 a mild case of Pancreatitis. Patient has hx/o Gout with most recent Uric acid 10.4 in Feb 2024.        HTN predates circa 2009. Patient's BP has been controlled at home.  Today's BP is at goal - 136/80.   In Feb 2024, patient's GFR had dropped to 45  ( CKD3a/b) from previous  GFR 70  in Aug 2023. Patient has hx/o pAfib in May 2020 [facility]).   Patient denies any cardiac symptoms as chest pain, palpitations, shortness of breath, dizziness or ankle swelling.       Patient's hyperlipidemia is controlled with diet and medications. Patient denies myalgias or other medication SE's. Last lipids were not at goal:  Lab Results  Component Value Date   CHOL 174 11/19/2022   HDL 43 11/19/2022   LDLCALC 109 (H) 11/19/2022   TRIG 114 11/19/2022   CHOLHDL 4.0 11/19/2022        Patient has hx/o prediabetes ( A1c 5.7% /2019)  and  highest A1c was 6.1% in Sept 2021.  Patient denies reactive hypoglycemic symptoms, visual blurring, diabetic polys or paresthesias. Last A1c was not at goal ;   Lab Results  Component Value Date   HGBA1C 5.9 (H) 11/28/2022        Finally, patient has history of Vitamin D Deficiency ("25" /2019)  and last vitamin D was still Low:   Lab Results  Component Value Date   VD25OH 43 11/19/2022        Current Outpatient Medications  Medication Instructions   allopurinol 300 MG tablet Take 1 tablet daily to prevent gout.   aspirin EC 81 MG tablet Takes 1 tablet Daily   LECITHIN  1 tablet, Oral, Daily   MAGNESIUM  Oral, PRN   metFORMIN -XR 500 MG  Take 2 tablets  2 times a day    Omega-3 FISH OIL Oral   VITAMIN A Oral   vitamin C 1,000 mg, Oral, Daily   Vitamin D3 10,000 Units, Oral, Daily   vitamin E 1,000 Units, Oral, Daily   zinc 50 MG tablet Takes 1 tablet every other day     Past Medical History:  Diagnosis Date   Cataract 2008   os   Cataract 2017   od   Degenerative joint disease (DJD) of hip    Diverticulitis large intestine    History of acute pancreatitis 01/15/2018   History of diverticulitis 01/15/2018   Hx of vitrectomy Sept 2010 Left eye  02/22/2015   Retinal detachment surgery per Fawn Kirk  Hyperlipidemia 11/2017   LDL 108   Hypertension 2009   started HCTZ & switched to Norvasc in Jan 2019   Pancreatitis 09/2017   Retinal detachment of left eye with single retinal tear 2010   Vitrectomy - Dr Luciana Axe   Health Maintenance  Topic Date Due   INFLUENZA VACCINE  Never done   TETANUS/TDAP  03/09/2021 (Originally 09/30/2013)   COLONOSCOPY  12/09/2024   COVID-19 Vaccine  Completed   Hepatitis C Screening  Completed   PNA vac Low Risk Adult  Discontinued    Immunization History  Administered Date(s) Administered   PFIZER SARS-COV-2 Vaccination 09/19/2019, 10/07/2019    Last Colon - 12/10/2019 - Buccimi - Recommend 5 yr f/u    Past Surgical History:  Procedure Laterality Date   EUS N/A 02/04/2018   Procedure: UPPER ENDOSCOPIC ULTRASOUND (EUS) RADIAL;  Surgeon: Willis Modena, MD;  Location: [facility] END[facility];  Service: Endoscopy;  Laterality: N/A;   EYE SURGERY Bilateral    Left 08/2007, R 02/02/2013   TOTAL HIP ARTHROPLASTY Right 02/19/2019   Procedure: RIGHT TOTAL HIP ARTHROPLASTY ANTERIOR APPROACH;  Surgeon: Kathryne Hitch, MD;  Location: [facility]  ORS;  Service: Orthopedics;  Laterality: Right;     Family History  Problem Relation Age of Onset   Stroke Mother    Cancer Mother    Cancer Father    Heart disease Brother    Cancer Brother    Cancer Sister      Social History   Socioeconomic History   Marital status: Divorced   Number of children: 3   Highest education level: Physician   Occupational History   Careers adviser  Tobacco Use   Smoking status: Never Smoker   Smokeless tobacco: Never Used  Building services engineer Use: Never used  Substance and Sexual Activity   Alcohol use: Yes    Comment: infrequent   Drug use: Never   Sexual activity: Yes      ROS  Constitutional: Denies fever, chills, weight loss/gain, headaches, insomnia,  night sweats or change in appetite. Does c/o fatigue. Eyes: Denies redness, blurred vision, diplopia, discharge, itchy or watery eyes.  ENT: Denies discharge, congestion, post nasal drip, epistaxis, sore throat, earache, hearing loss, dental pain, Tinnitus, Vertigo, Sinus pain or snoring.  Cardio: Denies chest pain, palpitations, irregular heartbeat, syncope, dyspnea, diaphoresis, orthopnea, PND, claudication or edema Respiratory: denies cough, dyspnea, DOE, pleurisy, hoarseness, laryngitis or wheezing.  Gastrointestinal: Denies dysphagia, heartburn, reflux, water brash, pain, cramps, nausea, vomiting, bloating, diarrhea, constipation, hematemesis, melena, hematochezia, jaundice or hemorrhoids Genitourinary: Denies dysuria, frequency, urgency, nocturia, hesitancy, discharge, hematuria or flank pain Musculoskeletal: Denies arthralgia, myalgia, stiffness, Jt. Swelling, pain, limp or strain/sprain. Denies Falls. Skin: Denies puritis, rash, hives, warts, acne, eczema or change in skin lesion Neuro: No weakness, tremor, incoordination, spasms, paresthesia or pain Psychiatric: Denies confusion, memory loss or sensory loss. Denies Depression. Endocrine: Denies change in weight, skin, hair change,  nocturia, and paresthesia, diabetic polys, visual blurring or hyper / hypo glycemic episodes.  Heme/Lymph: No excessive bleeding, bruising or enlarged lymph nodes.   Physical Exam  BP 136/80   Pulse (!) 54   Temp 97.6 F (36.4 C)   Resp 17   Ht 6' 0.05" (1.83 m)   Wt 239 lb 3.2 oz (108.5 kg)   SpO2 97%   BMI 32.40 kg/m   General Appearance: over nourished and well groomed and in no apparent distress.  Eyes: PERRLA, EOMs, conjunctiva no swelling or erythema, normal fundi and vessels.  Sinuses: No frontal/maxillary tenderness ENT/Mouth: EACs patent / TMs  nl. Nares clear without erythema, swelling, mucoid exudates. Oral hygiene is good. No erythema, swelling, or exudate. Tongue normal, non-obstructing. Tonsils not swollen or erythematous. Hearing normal.  Neck: Supple, thyroid not palpable. No bruits, nodes or JVD. Respiratory: Respiratory effort normal.  BS equal and clear bilateral without rales, rhonci, wheezing or stridor. Cardio: Heart sounds are normal with regular rate and rhythm and no murmurs, rubs or gallops. Peripheral pulses are normal and equal bilaterally without edema. No aortic or femoral bruits. Chest: symmetric with normal excursions and percussion.  Abdomen: Soft, with Nl bowel sounds. Nontender, no guarding, rebound, hernias, masses, or organomegaly.  Lymphatics: Non tender without lymphadenopathy.  Musculoskeletal: Full ROM all peripheral extremities, joint stability, 5/5 strength, and normal gait. Skin: Warm and dry without rashes, lesions, cyanosis, clubbing or  ecchymosis.  Neuro: Cranial nerves intact, reflexes equal bilaterally. Normal muscle tone, no cerebellar symptoms. Sensation intact.  Pysch: Alert and oriented X 3 with normal affect, insight and judgment appropriate.    Assessment and Plan   1. Annual Preventative/Screening Exam    2. Essential hypertension  - EKG 12-Lead - [place], RETROPERITNL ABD,  LTD - Urinalysis, Routine w reflex  microscopic - Microalbumin / creatinine urine ratio - CBC with Differential/Platelet - COMPLETE METABOLIC PANEL WITH GFR - Magnesium - TSH   3. Hyperlipidemia, mixed  - EKG 12-Lead - [place], RETROPERITNL ABD,  LTD - Lipid panel - TSH   4. Abnormal glucose  - EKG 12-Lead - [place], RETROPERITNL ABD,  LTD - Hemoglobin A1C w/out eAG - Insulin, random   5. Paroxysmal A-fib (HCC)  - EKG 12-Lead - VITAMIN D 25 Hydroxy   6. Stage 3 chronic kidney disease,   CKD (HCC)  - PTH, intact and calcium   7. Vitamin D deficiency  - VITAMIN D 25 Hydroxy  8. Idiopathic gout  - Uric acid   9. OSA on CPAP   10. BPH with obstruction/lower urinary tract symptoms  - PSA   11. Prostate cancer screening  - PSA   12. Screening for colorectal cancer  - POC Hemoccult Bld/Stl  13. Screening for heart disease  - EKG 12-Lead   14. Screening for AAA (aortic abdominal aneurysm)  - [place], RETROPERITNL ABD,  LTD   15. Medication management  - Microalbumin / creatinine urine ratio - Uric acid - CBC with Differential/Platelet - COMPLETE METABOLIC PANEL WITH GFR - Magnesium - Lipid panel - TSH - Hemoglobin A1C w/out eAG - Insulin, random - VITAMIN D 25 Hydroxy          Patient was counseled in prudent diet, weight control to achieve/maintain BMI less than 25, BP monitoring, regular exercise and medications as discussed.  Discussed med effects and SE's. Routine screening labs and tests as requested with regular follow-up as recommended. Over 40 minutes of exam, counseling, chart review and high complex critical decision making was performed   Marinus Maw, MD

## 2023-04-08 NOTE — Patient Instructions (Signed)

## 2023-04-09 LAB — LIPID PANEL
Cholesterol: 153 mg/dL (ref <200)
HDL: 45 mg/dL (ref >=40)
LDL Cholesterol (Calc): 94 mg/dL (calc)
Non-HDL Cholesterol (Calc): 108 mg/dL (calc) (ref <130)
Total CHOL/HDL Ratio: 3.4 (calc) (ref <5.0)
Triglycerides: 49 mg/dL (ref <150)

## 2023-04-09 LAB — CBC WITH DIFFERENTIAL/PLATELET
Basophils Absolute: 57 cells/uL (ref 0–200)
Basophils Relative: 0.9 %
Eosinophils Absolute: 239 cells/uL (ref 15–500)
Lymphs Abs: 1355 cells/uL (ref 850–3900)
MCH: 30.6 pg (ref 27.0–33.0)
MPV: 11.4 fL (ref 7.5–12.5)
Neutro Abs: 4001 cells/uL (ref 1500–7800)
Platelets: 249 10*3/uL (ref 140–400)
RBC: 4.34 10*6/uL (ref 4.20–5.80)

## 2023-04-09 LAB — COMPLETE METABOLIC PANEL WITH GFR
AG Ratio: 1.6 (calc) (ref 1.0–2.5)
ALT: 20 U/L (ref 9–46)
AST: 22 U/L (ref 10–35)
Albumin: 4.4 g/dL (ref 3.6–5.1)
Alkaline phosphatase (APISO): 85 U/L (ref 35–144)
BUN: 18 mg/dL (ref 7–25)
CO2: 29 mmol/L (ref 20–32)
Calcium: 9.6 mg/dL (ref 8.6–10.3)
Chloride: 102 mmol/L (ref 98–110)
Creat: 1.03 mg/dL (ref 0.70–1.28)
Globulin: 2.7 g/dL (calc) (ref 1.9–3.7)
Glucose, Bld: 74 mg/dL (ref 65–99)
Potassium: 4 mmol/L (ref 3.5–5.3)
Sodium: 139 mmol/L (ref 135–146)
Total Bilirubin: 0.6 mg/dL (ref 0.2–1.2)
Total Protein: 7.1 g/dL (ref 6.1–8.1)
eGFR: 78 mL/min/{1.73_m2} (ref >=60)

## 2023-04-09 LAB — MICROALBUMIN / CREATININE URINE RATIO
Creatinine, Urine: 113 mg/dL (ref 20–320)
Microalb Creat Ratio: 5 mg/g creat (ref <30)
Microalb, Ur: 0.6 mg/dL

## 2023-04-09 LAB — URINALYSIS, ROUTINE W REFLEX MICROSCOPIC
Bilirubin Urine: NEGATIVE
Glucose, UA: NEGATIVE
Hgb urine dipstick: NEGATIVE
Ketones, ur: NEGATIVE
Leukocytes,Ua: NEGATIVE
Nitrite: NEGATIVE
Protein, ur: NEGATIVE
Specific Gravity, Urine: 1.015 (ref 1.001–1.035)
pH: 5.5 (ref 5.0–8.0)

## 2023-04-09 LAB — PTH, INTACT AND CALCIUM
Calcium: 9.6 mg/dL (ref 8.6–10.3)
PTH: 38 pg/mL (ref 16–77)

## 2023-04-09 LAB — VITAMIN D 25 HYDROXY (VIT D DEFICIENCY, FRACTURES): Vit D, 25-Hydroxy: 53 ng/mL (ref 30–100)

## 2023-04-09 LAB — HEMOGLOBIN A1C W/OUT EAG: Hgb A1c MFr Bld: 5.7 % of total Hgb — ABNORMAL HIGH (ref <5.7)

## 2023-04-09 LAB — MAGNESIUM: Magnesium: 2 mg/dL (ref 1.5–2.5)

## 2023-04-09 LAB — PSA: PSA: 0.53 ng/mL (ref <=4.00)

## 2023-04-09 LAB — TSH: TSH: 1.71 mIU/L (ref 0.40–4.50)

## 2023-04-09 LAB — URIC ACID: Uric Acid, Serum: 5.2 mg/dL (ref 4.0–8.0)

## 2023-04-09 LAB — INSULIN, RANDOM: Insulin: 6 u[IU]/mL

## 2023-04-09 NOTE — Progress Notes (Signed)
^<^<^<^<^<^<^<^<^<^<^<^<^<^<^<^<^<^<^<^<^<^<^<^<^<^<^<^<^<^<^<^<^<^<^<^<^ ^>^>^>^>^>^>^>^>^>^>^>>^>^>^>^>^>^>^>^>^>^>^>^>^>^>^>^>^>^>^>^>^>^>^>^>^>  -  Test results slightly outside the reference range are not unusual. If there is anything important, I will review this with you,  otherwise it is considered normal test values.  If you have further questions,  please do not hesitate to contact me at the office or via My Chart.   ^<^<^<^<^<^<^<^<^<^<^<^<^<^<^<^<^<^<^<^<^<^<^<^<^<^<^<^<^<^<^<^<^<^<^<^<^ ^>^>^>^>^>^>^>^>^>^>^>^>^>^>^>^>^>^>^>^>^>^>^>^>^>^>^>^>^>^>^>^>^>^>^>^>^  -  A1c = 5.7% - Better - was  5.9%   ^>^>^>^>^>^>^>^>^>^>^>^>^>^>^>^>^>^>^>^>^>^>^>^>^>^>^>^>^>^>^>^>^>^>^>^>^  -  PSA - very low - Great - No Prostate cancer  ^>^>^>^>^>^>^>^>^>^>^>^>^>^>^>^>^>^>^>^>^>^>^>^>^>^>^>^>^>^>^>^>^>^>^>^>^  -  Uric Acid = 5.2 - Great  - ( was   10.3  in Feb 24 )  ^>^>^>^>^>^>^>^>^>^>^>^>^>^>^>^>^>^>^>^>^>^>^>^>^>^>^>^>^>^>^>^>^>^>^>^>^  -  PTH  & Calcium - screening for secondary Hyperpara is Normal - Great   ^>^>^>^>^>^>^>^>^>^>^>^>^>^>^>^>^>^>^>^>^>^>^>^>^>^>^>^>^>^>^>^>^>^>^>^>^  -  Chol = 153  /  LDL = 94 - Both  Excellent   - Very low risk for Heart Attack  / Stroke  ^>^>^>^>^>^>^>^>^>^>^>^>^>^>^>^>^>^>^>^>^>^>^>^>^>^>^>^>^>^>^>^>^>^>^>^>^ ^>^>^>^>^>^>^>^>^>^>^>^>^>^>^>^>^>^>^>^>^>^>^>^>^>^>^>^>^>^>^>^>^>^>^>^>^  -  Vit D= 53 --OK - Please keep dose same   ^>^>^>^>^>^>^>^>^>^>^>^>^>^>^>^>^>^>^>^>^>^>^>^>^>^>^>^>^>^>^>^>^>^>^>^>^  -  All Else - CBC - Kidneys - Electrolytes - Liver - Magnesium & Thyroid    - all  Normal / OK ^>^>^>^>^>^>^>^>^>^>^>^>^>^>^>^>^>^>^>^>^>^>^>^>^>^>^>^>^>^>^>^>^>^>^>^>^ ^>^>^>^>^>^>^>^>^>^>^>^>^>^>^>^>^>^>^>^>^>^>^>^>^>^>^>^>^>^>^>^>^>^>^>^>^  -   Keep up the [place] Work   !  ^>^>^>^>^>^>^>^>^>^>^>^>^>^>^>^>^>^>^>^>^>^>^>^>^>^>^>^>^>^>^>^>^>^>^>^>^ ^>^>^>^>^>^>^>^>^>^>^>^>^>^>^>^>^>^>^>^>^>^>^>^>^>^>^>^>^>^>^>^>^>^>^>^>^

## 2023-04-12 ENCOUNTER — Encounter: Payer: Self-pay | Admitting: Internal Medicine

## 2023-04-14 ENCOUNTER — Ambulatory Visit (INDEPENDENT_AMBULATORY_CARE_PROVIDER_SITE_OTHER): Payer: PPO | Admitting: Orthopaedic Surgery

## 2023-04-14 ENCOUNTER — Encounter: Payer: Self-pay | Admitting: Orthopaedic Surgery

## 2023-04-14 ENCOUNTER — Other Ambulatory Visit (INDEPENDENT_AMBULATORY_CARE_PROVIDER_SITE_OTHER): Payer: PPO

## 2023-04-14 DIAGNOSIS — M1612 Unilateral primary osteoarthritis, left hip: Secondary | ICD-10-CM | POA: Diagnosis not present

## 2023-04-14 DIAGNOSIS — M25552 Pain in left hip: Secondary | ICD-10-CM | POA: Diagnosis not present

## 2023-04-14 DIAGNOSIS — M25562 Pain in left knee: Secondary | ICD-10-CM | POA: Diagnosis not present

## 2023-04-14 DIAGNOSIS — G8929 Other chronic pain: Secondary | ICD-10-CM

## 2023-04-14 NOTE — Progress Notes (Signed)
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  We replaced his right hip in 2020.  He has been developing left hip pain and stiffness for over a year now.  He has groin pain on the left hip.  It is radiating down to his knee on the left side.  He has had labs recently checked and all those are normal.  His heart is doing well.  He is not on blood thinning medications.  He is lost 30 pounds.  His BMI is down to 32.  His right hip is done well.  He can see that he walks with a Trendelenburg gait and his posture is not great as a relates to his left hip and groin pain.  His right hip moves smoothly and fluidly.  The left hip has significant limitations with rotation with pain in the groin on rotation.  His left knee shows no effusion with excellent range of motion.  An AP pelvis and lateral of the left hip when seen today and compared to films from 2020 shows significantly worsened left hip arthritis.  It is now bone-on-bone wear with cystic changes and sclerotic changes.  The right hip replacement appears well-seated.  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  A long thorough discussion about hip replacement for his left side.  He does wish to proceed in the near future with a hip replacement left side.  Having had this before he is fully aware of the risk and benefits of the surgery and what to expect from an intraoperative and postoperative standpoint.  He understands we will be in touch about scheduling him for a left hip replacement hopefully in the near future.

## 2023-04-23 ENCOUNTER — Encounter: Payer: Self-pay | Admitting: Internal Medicine

## 2023-05-12 ENCOUNTER — Other Ambulatory Visit: Payer: Self-pay

## 2023-05-16 ENCOUNTER — Other Ambulatory Visit: Payer: Self-pay | Admitting: Internal Medicine

## 2023-05-16 ENCOUNTER — Other Ambulatory Visit (HOSPITAL_COMMUNITY): Payer: Self-pay

## 2023-05-16 DIAGNOSIS — E1122 Type 2 diabetes mellitus with diabetic chronic kidney disease: Secondary | ICD-10-CM

## 2023-05-16 MED ORDER — METFORMIN HCL ER 500 MG PO TB24
1000.0000 mg | ORAL_TABLET | Freq: Two times a day (BID) | ORAL | 3 refills | Status: DC
  Filled 2023-05-16: qty 360, 90d supply, fill #0

## 2023-05-20 NOTE — Patient Instructions (Signed)
SURGICAL WAITING ROOM VISITATION  Patients having surgery or a procedure may have no more than 2 support people in the waiting area - these visitors may rotate.    Children under the age of 30 must have an adult with them who is not the patient.  Due to an increase in RSV and influenza rates and associated hospitalizations, children ages 82 and under may not visit patients in [facility] hospitals.  If the patient needs to stay at the hospital during part of their recovery, the visitor guidelines for inpatient rooms apply. Pre-op nurse will coordinate an appropriate time for 1 support person to accompany patient in pre-op.  This support person may not rotate.    Please refer to the [facility] website for the visitor guidelines for Inpatients (after your surgery is over and you are in a regular room).    Your procedure is scheduled on: 05/30/23   Report to [facility] Main Entrance    Report to admitting at 5:15 AM   Call this number if you have problems the morning of surgery [phone]   Do not eat food :After Midnight.   After Midnight you may have the following liquids until 4:15 AM DAY OF SURGERY  Water Non-Citrus Juices (without pulp, NO RED-Apple, White grape, White cranberry) Black Coffee (NO MILK/CREAM OR CREAMERS, sugar ok)  Clear Tea (NO MILK/CREAM OR CREAMERS, sugar ok) regular and decaf                             Plain Jell-O (NO RED)                                           Fruit ices (not with fruit pulp, NO RED)                                     Popsicles (NO RED)                                                               Sports drinks like Gatorade (NO RED)                 The day of surgery:  Drink ONE (1) Pre-Surgery G2 at 4:15 AM the morning of surgery. Drink in one sitting. Do not sip.  This drink was given to you during your hospital  pre-op appointment visit. Nothing else to drink after completing the  Pre-Surgery G2.          If you  have questions, please contact your surgeon's office.   FOLLOW BOWEL PREP AND ANY ADDITIONAL PRE OP INSTRUCTIONS YOU RECEIVED FROM YOUR SURGEON'S OFFICE!!!     Oral Hygiene is also important to reduce your risk of infection.                                    Remember - BRUSH YOUR TEETH THE MORNING OF SURGERY WITH YOUR REGULAR TOOTHPASTE  DENTURES WILL BE REMOVED  PRIOR TO SURGERY PLEASE DO NOT APPLY "Poly grip" OR ADHESIVES!!!   Stop all vitamins and herbal supplements 7 days before surgery.   Take these medicines the morning of surgery with A SIP OF WATER: Allopurinol  DO NOT TAKE ANY ORAL DIABETIC MEDICATIONS DAY OF YOUR SURGERY                              You may not have any metal on your body including jewelry, and body piercing             Do not wear lotions, powders, cologne, or deodorant               Men may shave face and neck.   Do not bring valuables to the hospital. [facility] IS NOT             RESPONSIBLE   FOR VALUABLES.   Contacts, glasses, dentures or bridgework may not be worn into surgery.   Bring small overnight bag day of surgery.   DO NOT BRING YOUR HOME MEDICATIONS TO THE HOSPITAL. PHARMACY WILL DISPENSE MEDICATIONS LISTED ON YOUR MEDICATION LIST TO YOU DURING YOUR ADMISSION IN THE HOSPITAL!              Please read over the following fact sheets you were given: IF YOU HAVE QUESTIONS ABOUT YOUR PRE-OP INSTRUCTIONS PLEASE CALL [phone] Davis    If you received a COVID test during your pre-op visit  it is requested that you wear a mask when out in public, stay away from anyone that may not be feeling well and notify your surgeon if you develop symptoms. If you test positive for Covid or have been in contact with anyone that has tested positive in the last 10 days please notify you surgeon.      Pre-operative 5 CHG Bath Instructions   You can play a key role in reducing the risk of infection after surgery. Your skin needs to be as free of germs  as possible. You can reduce the number of germs on your skin by washing with CHG (chlorhexidine gluconate) soap before surgery. CHG is an antiseptic soap that kills germs and continues to kill germs even after washing.   DO NOT use if you have an allergy to chlorhexidine/CHG or antibacterial soaps. If your skin becomes reddened or irritated, stop using the CHG and notify one of our RNs at [phone].   Please shower with the CHG soap starting 4 days before surgery using the following schedule:     Please keep in mind the following:  DO NOT shave, including legs and underarms, starting the day of your first shower.   You may shave your face at any point before/day of surgery.  Place clean sheets on your bed the day you start using CHG soap. Use a clean washcloth (not used since being washed) for each shower. DO NOT sleep with pets once you start using the CHG.   CHG Shower Instructions:  If you choose to wash your hair and private area, wash first with your normal shampoo/soap.  After you use shampoo/soap, rinse your hair and body thoroughly to remove shampoo/soap residue.  Turn the water OFF and apply about 3 tablespoons (45 ml) of CHG soap to a CLEAN washcloth.  Apply CHG soap ONLY FROM YOUR NECK DOWN TO YOUR TOES (washing for 3-5 minutes)  DO NOT use CHG soap on face, private areas, open  wounds, or sores.  Pay special attention to the area where your surgery is being performed.  If you are having back surgery, having someone wash your back for you may be helpful. Wait 2 minutes after CHG soap is applied, then you may rinse off the CHG soap.  Pat dry with a clean towel  Put on clean clothes/pajamas   If you choose to wear lotion, please use ONLY the CHG-compatible lotions on the back of this paper.     Additional instructions for the day of surgery: DO NOT APPLY any lotions, deodorants, cologne, or perfumes.   Put on clean/comfortable clothes.  Brush your teeth.  Ask your nurse  before applying any prescription medications to the skin.      CHG Compatible Lotions   Aveeno Moisturizing lotion  Cetaphil Moisturizing Cream  Cetaphil Moisturizing Lotion  Clairol Herbal Essence Moisturizing Lotion, Dry Skin  Clairol Herbal Essence Moisturizing Lotion, Extra Dry Skin  Clairol Herbal Essence Moisturizing Lotion, Normal Skin  Curel Age Defying Therapeutic Moisturizing Lotion with Alpha Hydroxy  Curel Extreme Care Body Lotion  Curel Soothing Hands Moisturizing Hand Lotion  Curel Therapeutic Moisturizing Cream, Fragrance-Free  Curel Therapeutic Moisturizing Lotion, Fragrance-Free  Curel Therapeutic Moisturizing Lotion, Original Formula  Eucerin Daily Replenishing Lotion  Eucerin Dry Skin Therapy Plus Alpha Hydroxy Crme  Eucerin Dry Skin Therapy Plus Alpha Hydroxy Lotion  Eucerin Original Crme  Eucerin Original Lotion  Eucerin Plus Crme Eucerin Plus Lotion  Eucerin TriLipid Replenishing Lotion  Keri Anti-Bacterial Hand Lotion  Keri Deep Conditioning Original Lotion Dry Skin Formula Softly Scented  Keri Deep Conditioning Original Lotion, Fragrance Free Sensitive Skin Formula  Keri Lotion Fast Absorbing Fragrance Free Sensitive Skin Formula  Keri Lotion Fast Absorbing Softly Scented Dry Skin Formula  Keri Original Lotion  Keri Skin Renewal Lotion Keri Silky Smooth Lotion  Keri Silky Smooth Sensitive Skin Lotion  Nivea Body Creamy Conditioning Oil  Nivea Body Extra Enriched Teacher, adult education Moisturizing Lotion Nivea Crme  Nivea Skin Firming Lotion  NutraDerm 30 Skin Lotion  NutraDerm Skin Lotion  NutraDerm Therapeutic Skin Cream  NutraDerm Therapeutic Skin Lotion  ProShield Protective Hand Cream  Provon moisturizing lotion  WHAT IS A BLOOD TRANSFUSION? Blood Transfusion Information  A transfusion is the replacement of blood or some of its parts. Blood is made up of multiple cells which provide different  functions. Red blood cells carry oxygen and are used for blood loss replacement. White blood cells fight against infection. Platelets control bleeding. Plasma helps clot blood. Other blood products are available for specialized needs, such as hemophilia or other clotting disorders. BEFORE THE TRANSFUSION  Who gives blood for transfusions?  Healthy volunteers who are fully evaluated to make sure their blood is safe. This is blood bank blood. Transfusion therapy is the safest it has ever been in the practice of medicine. Before blood is taken from a donor, a complete history is taken to make sure that person has no history of diseases nor engages in risky social behavior (examples are intravenous drug use or sexual activity with multiple partners). The donor's travel history is screened to minimize risk of transmitting infections, such as malaria. The donated blood is tested for signs of infectious diseases, such as HIV and hepatitis. The blood is then tested to be sure it is compatible with you in order to minimize the chance of a transfusion reaction. If you or a relative donates blood, this is often done in  anticipation of surgery and is not appropriate for emergency situations. It takes many days to process the donated blood. RISKS AND COMPLICATIONS Although transfusion therapy is very safe and saves many lives, the main dangers of transfusion include:  Getting an infectious disease. Developing a transfusion reaction. This is an allergic reaction to something in the blood you were given. Every precaution is taken to prevent this. The decision to have a blood transfusion has been considered carefully by your caregiver before blood is given. Blood is not given unless the benefits outweigh the risks. AFTER THE TRANSFUSION Right after receiving a blood transfusion, you will usually feel much better and more energetic. This is especially true if your red blood cells have gotten low (anemic). The  transfusion raises the level of the red blood cells which carry oxygen, and this usually causes an energy increase. The nurse administering the transfusion will monitor you carefully for complications. HOME CARE INSTRUCTIONS  No special instructions are needed after a transfusion. You may find your energy is better. Speak with your caregiver about any limitations on activity for underlying diseases you may have. SEEK MEDICAL CARE IF:  Your condition is not improving after your transfusion. You develop redness or irritation at the intravenous (IV) site. SEEK IMMEDIATE MEDICAL CARE IF:  Any of the following symptoms occur over the next 12 hours: Shaking chills. You have a temperature by mouth above 102 F (38.9 C), not controlled by medicine. Chest, back, or muscle pain. People around you feel you are not acting correctly or are confused. Shortness of breath or difficulty breathing. Dizziness and fainting. You get a rash or develop hives. You have a decrease in urine output. Your urine turns a dark color or changes to pink, red, or brown. Any of the following symptoms occur over the next 10 days: You have a temperature by mouth above 102 F (38.9 C), not controlled by medicine. Shortness of breath. Weakness after normal activity. The white part of the eye turns yellow (jaundice). You have a decrease in the amount of urine or are urinating less often. Your urine turns a dark color or changes to pink, red, or brown. Document Released: 09/13/2000 Document Revised: 12/09/2011 Document Reviewed: 05/02/2008 [employer] Patient Information 2014 [place], [place].  _______________________________________________________________________  Incentive Spirometer  An incentive spirometer is a tool that can help keep your lungs clear and active. This tool measures how well you are filling your lungs with each breath. Taking long deep breaths may help reverse or decrease the chance of developing breathing  (pulmonary) problems (especially infection) following: A long period of time when you are unable to move or be active. BEFORE THE PROCEDURE  If the spirometer includes an indicator to show your best effort, your nurse or respiratory therapist will set it to a desired goal. If possible, sit up straight or lean slightly forward. Try not to slouch. Hold the incentive spirometer in an upright position. INSTRUCTIONS FOR USE  Sit on the edge of your bed if possible, or sit up as far as you can in bed or on a chair. Hold the incentive spirometer in an upright position. Breathe out normally. Place the mouthpiece in your mouth and seal your lips tightly around it. Breathe in slowly and as deeply as possible, raising the piston or the ball toward the top of the column. Hold your breath for 3-5 seconds or for as long as possible. Allow the piston or ball to fall to the bottom of the column. Remove the  mouthpiece from your mouth and breathe out normally. Rest for a few seconds and repeat Steps 1 through 7 at least 10 times every 1-2 hours when you are awake. Take your time and take a few normal breaths between deep breaths. The spirometer may include an indicator to show your best effort. Use the indicator as a goal to work toward during each repetition. After each set of 10 deep breaths, practice coughing to be sure your lungs are clear. If you have an incision (the cut made at the time of surgery), support your incision when coughing by placing a pillow or rolled up towels firmly against it. Once you are able to get out of bed, walk around indoors and cough well. You may stop using the incentive spirometer when instructed by your caregiver.  RISKS AND COMPLICATIONS Take your time so you do not get dizzy or light-headed. If you are in pain, you may need to take or ask for pain medication before doing incentive spirometry. It is harder to take a deep breath if you are having pain. AFTER USE Rest and  breathe slowly and easily. It can be helpful to keep track of a log of your progress. Your caregiver can provide you with a simple table to help with this. If you are using the spirometer at home, follow these instructions: SEEK MEDICAL CARE IF:  You are having difficultly using the spirometer. You have trouble using the spirometer as often as instructed. Your pain medication is not giving enough relief while using the spirometer. You develop fever of 100.5 F (38.1 C) or higher. SEEK IMMEDIATE MEDICAL CARE IF:  You cough up bloody sputum that had not been present before. You develop fever of 102 F (38.9 C) or greater. You develop worsening pain at or near the incision site. MAKE SURE YOU:  Understand these instructions. Will watch your condition. Will get help right away if you are not doing well or get worse. Document Released: 01/27/2007 Document Revised: 12/09/2011 Document Reviewed: 03/30/2007 [facility] Patient Information 2014 [place], [place].   ________________________________________________________________________

## 2023-05-20 NOTE — Progress Notes (Signed)
COVID Vaccine Completed: yes  Date of COVID positive in last 90 days:  PCP - Lucky Cowboy, MD Cardiologist - Sherryl Manges, MD LOV 03/18/19  Chest x-ray - CTA neg EKG - 04/08/23 Epic Stress Test - n/a ECHO - 02/15/19 Epic Cardiac Cath - n/a Pacemaker/ICD device last checked: n/a Spinal Cord Stimulator: n/a  Bowel Prep - no  Sleep Study - yes CPAP - no  Fasting Blood Sugar - preDM Checks Blood Sugar no checks at home, lost weight  Last dose of GLP1 agonist-  N/A GLP1 instructions:  N/A   Last dose of SGLT-2 inhibitors-  N/A SGLT-2 instructions: N/A   Blood Thinner Instructions:  Time Aspirin Instructions: ASA 81, no instructions,  Last Dose:  Activity level: Can go up a flight of stairs and perform activities of daily living without stopping and without symptoms of chest pain or shortness of breath.    Anesthesia review: HTN, a fib, OSA, CKD, BP 152/105, will monitor at home  Patient denies shortness of breath, fever, cough and chest pain at PAT appointment  Patient verbalized understanding of instructions that were given to them at the PAT appointment. Patient was also instructed that they will need to review over the PAT instructions again at home before surgery.

## 2023-05-21 ENCOUNTER — Encounter (HOSPITAL_COMMUNITY)
Admission: RE | Admit: 2023-05-21 | Discharge: 2023-05-21 | Disposition: A | Discharge status: Home / Self Care | Payer: PPO | Source: Ambulatory Visit | Attending: Orthopaedic Surgery | Admitting: Orthopaedic Surgery

## 2023-05-21 ENCOUNTER — Encounter (HOSPITAL_COMMUNITY): Payer: Self-pay

## 2023-05-21 ENCOUNTER — Other Ambulatory Visit: Payer: Self-pay

## 2023-05-21 VITALS — BP 152/105 | HR 52 | Temp 97.9°F | Resp 16 | Ht 73.5 in | Wt 237.0 lb

## 2023-05-21 DIAGNOSIS — Z01812 Encounter for preprocedural laboratory examination: Secondary | ICD-10-CM | POA: Insufficient documentation

## 2023-05-21 DIAGNOSIS — Z01818 Encounter for other preprocedural examination: Secondary | ICD-10-CM

## 2023-05-21 DIAGNOSIS — M1612 Unilateral primary osteoarthritis, left hip: Secondary | ICD-10-CM | POA: Diagnosis not present

## 2023-05-21 LAB — CBC
HCT: 43.6 % (ref 39.0–52.0)
Hemoglobin: 14 g/dL (ref 13.0–17.0)
MCH: 30.4 pg (ref 26.0–34.0)
MCHC: 32.1 g/dL (ref 30.0–36.0)
MCV: 94.6 fL (ref 80.0–100.0)
Platelets: 250 10*3/uL (ref 150–400)
RBC: 4.61 MIL/uL (ref 4.22–5.81)
RDW: 14.1 % (ref 11.5–15.5)
WBC: 6.3 10*3/uL (ref 4.0–10.5)
nRBC: 0 % (ref 0.0–0.2)

## 2023-05-21 LAB — COMPREHENSIVE METABOLIC PANEL
ALT: 20 U/L (ref 0–44)
AST: 20 U/L (ref 15–41)
Albumin: 4.2 g/dL (ref 3.5–5.0)
Alkaline Phosphatase: 84 U/L (ref 38–126)
Anion gap: 9 (ref 5–15)
BUN: 15 mg/dL (ref 8–23)
CO2: 27 mmol/L (ref 22–32)
Calcium: 9.1 mg/dL (ref 8.9–10.3)
Chloride: 103 mmol/L (ref 98–111)
Creatinine, Ser: 0.96 mg/dL (ref 0.61–1.24)
GFR, Estimated: >60 mL/min (ref >60)
Glucose, Bld: 100 mg/dL — ABNORMAL HIGH (ref 70–99)
Potassium: 3.6 mmol/L (ref 3.5–5.1)
Sodium: 139 mmol/L (ref 135–145)
Total Bilirubin: 0.8 mg/dL (ref 0.3–1.2)
Total Protein: 7.4 g/dL (ref 6.5–8.1)

## 2023-05-21 LAB — SURGICAL PCR SCREEN
MRSA, PCR: NEGATIVE
Staphylococcus aureus: NEGATIVE

## 2023-05-26 ENCOUNTER — Other Ambulatory Visit (HOSPITAL_COMMUNITY): Payer: Self-pay

## 2023-05-27 ENCOUNTER — Other Ambulatory Visit (HOSPITAL_COMMUNITY): Payer: Self-pay

## 2023-05-28 ENCOUNTER — Telehealth: Payer: Self-pay | Admitting: *Deleted

## 2023-05-28 NOTE — Care Plan (Signed)
[facility] RNCM call to Dr. [name] to discuss his upcoming Left total hip arthroplasty with Dr. Magnus Ivan on 05/30/23 at [facility]. He is an Ortho bundle patient through [facility] and is agreeable to case management. He lives with his spouse, who will be assisting after surgery. He has a RW, cane, 3in1/BSC. No other DME needed. Anticipate HHPT will be needed after a short hospital stay. Referral made to [facility] after choice provided. Reviewed all post op care instructions. Will continue to follow for needs.

## 2023-05-28 NOTE — Telephone Encounter (Signed)
Ortho bundle pre-op call completed. 

## 2023-05-29 NOTE — H&P (Signed)
TOTAL HIP ADMISSION H&P  Patient is admitted for left total hip arthroplasty.  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  Onset of symptoms was gradual starting 2 years ago with gradually worsening course since that time.The patient noted no past surgery on the left hip(s).  Patient currently rates pain in the left hip at 8 out of 10 with activity. Patient has night pain, worsening of pain with activity and weight bearing, pain that interfers with activities of daily living, and pain with passive range of motion. Patient has evidence of subchondral sclerosis, periarticular osteophytes, and joint space narrowing by imaging studies. This condition presents safety issues increasing the risk of falls.  There is no current active infection.  Patient Active Problem List   Diagnosis Date Noted   Unilateral primary osteoarthritis, left hip 04/14/2023   Unilateral primary osteoarthritis, left knee 05/15/2022   Gout 06/14/2020   OSA on CPAP 02/07/2020   Atrial fibrillation with controlled ventricular response (HCC) 02/11/2019   Morbid obesity (HCC) - BMI 30+ with OSA 01/22/2019   Hyperlipidemia, mixed 01/22/2019   Vitamin D deficiency 01/22/2019   Essential hypertension 01/15/2018   Sigmoid diverticulosis 01/15/2018   Past Medical History:  Diagnosis Date   Cataract 2008   os   Cataract 2017   od   Degenerative joint disease (DJD) of hip    Diverticulitis large intestine    History of acute pancreatitis 01/15/2018   History of diverticulitis 01/15/2018   Hx of vitrectomy Sept 2010 Left eye  02/22/2015   Retinal detachment surgery per Fawn Kirk    Hyperlipidemia 11/2017    LDL 108   Hypertension 2009   started HCTZ & switched to Norvasc in Jan 2019   Pancreatitis 09/2017   Retinal detachment of left eye with single retinal tear 2010   Vitrectomy - Dr Luciana Axe   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   Current Outpatient Medications  Medication Sig Dispense Refill Last Dose   allopurinol (ZYLOPRIM) 300 MG tablet Take 1 tablet (300 mg total) by mouth daily to prevent gout. 90 tablet 3    Ascorbic Acid (VITAMIN C) 1000 MG tablet Take 1,000 mg by mouth daily.      aspirin EC 81 MG tablet Takes 1 tablet Daily      Cholecalciferol (VITAMIN D3) 10000 units TABS Take 10,000 Units by mouth daily.      LECITHIN PO Take 1 tablet by mouth every other day.      MAGNESIUM PO Take 1 tablet by mouth daily as needed (cramps).      Omega-3 Fatty Acids (FISH OIL PO) Take 1 Dose by mouth daily. Liquid  VITAMIN A PO Take 1 tablet by mouth every 30 (thirty) days.      vitamin E 1000 UNIT capsule Take 1 capsule (1,000 Units total) by mouth daily.      zinc gluconate 50 MG tablet Takes 1 tablet every other day      metFORMIN (GLUCOPHAGE-XR) 500 MG 24 hr tablet Take 2 tablets (1,000 mg total) by mouth 2 (two) times daily with meals for diabetes. 360 tablet 3    No Known Allergies  Social History   Tobacco Use   Smoking status: Never   Smokeless tobacco: Never  Substance Use Topics   Alcohol use: Yes    Comment:  infrequent    Family History  Problem Relation Age of Onset   Stroke Mother    Cancer Mother    Cancer Father    Heart disease Brother    Cancer Brother    Cancer Sister      Review of Systems  Objective:  Physical Exam Vitals reviewed.  Constitutional:      Appearance: Normal appearance. He is normal weight.  HENT:     Head: Normocephalic and atraumatic.  Eyes:     Extraocular Movements: Extraocular movements intact.     Pupils: Pupils are equal, round, and reactive to light.  Cardiovascular:     Rate and Rhythm: Normal rate. Rhythm irregular.     Pulses: Normal pulses.  Pulmonary:     Effort: Pulmonary effort is normal.     Breath sounds: Normal breath sounds.  Abdominal:     Palpations: Abdomen is soft.  Musculoskeletal:     Cervical back: Normal range of motion and neck supple.     Left hip: Tenderness and bony tenderness present. Decreased range of motion. Decreased strength.  Neurological:     Mental Status: He is alert and oriented to person, place, and time.  Psychiatric:        Behavior: Behavior normal.     Vital signs in last 24 hours:    Labs:   Estimated body mass index is 30.84 kg/m as calculated from the following:   Height as of 05/21/23: 6' 1.5" (1.867 m).   Weight as of 05/21/23: 107.5 kg.   Imaging Review Plain radiographs demonstrate severe degenerative joint disease of the left hip(s). The bone quality appears to be excellent for age and reported activity level.      Assessment/Plan:  End stage arthritis, left hip(s)  The patient history, physical examination, clinical judgement of the provider and imaging studies are consistent with end stage degenerative joint disease of the left hip(s) and total hip arthroplasty is deemed medically necessary. The treatment options including medical management, injection therapy, arthroscopy and arthroplasty were discussed at length. The risks and benefits of total hip arthroplasty were presented  and reviewed. The risks due to aseptic loosening, infection, stiffness, dislocation/subluxation,  thromboembolic complications and other imponderables were discussed.  The patient acknowledged the explanation, agreed to proceed with the plan and consent was signed. Patient is being admitted for inpatient treatment for surgery, pain control, PT, OT, prophylactic antibiotics, VTE prophylaxis, progressive ambulation and ADL's and discharge planning.The patient is planning to be discharged home with home health services

## 2023-05-30 ENCOUNTER — Ambulatory Visit (HOSPITAL_COMMUNITY): Payer: PPO | Admitting: Anesthesiology

## 2023-05-30 ENCOUNTER — Ambulatory Visit (HOSPITAL_COMMUNITY): Payer: PPO

## 2023-05-30 ENCOUNTER — Encounter (HOSPITAL_COMMUNITY)
Admission: RE | Disposition: A | Discharge status: Home Health | Payer: Self-pay | Source: Home / Self Care | Attending: Orthopaedic Surgery

## 2023-05-30 ENCOUNTER — Ambulatory Visit (HOSPITAL_BASED_OUTPATIENT_CLINIC_OR_DEPARTMENT_OTHER): Payer: PPO | Admitting: Anesthesiology

## 2023-05-30 ENCOUNTER — Encounter (HOSPITAL_COMMUNITY): Payer: Self-pay | Admitting: Orthopaedic Surgery

## 2023-05-30 ENCOUNTER — Observation Stay (HOSPITAL_COMMUNITY)
Admission: RE | Admit: 2023-05-30 | Discharge: 2023-05-31 | Disposition: A | Discharge status: Home Health | Payer: PPO | Attending: Orthopaedic Surgery | Admitting: Orthopaedic Surgery

## 2023-05-30 ENCOUNTER — Observation Stay (HOSPITAL_COMMUNITY): Payer: PPO

## 2023-05-30 ENCOUNTER — Other Ambulatory Visit: Payer: Self-pay

## 2023-05-30 DIAGNOSIS — E782 Mixed hyperlipidemia: Secondary | ICD-10-CM | POA: Diagnosis not present

## 2023-05-30 DIAGNOSIS — Z7982 Long term (current) use of aspirin: Secondary | ICD-10-CM | POA: Diagnosis not present

## 2023-05-30 DIAGNOSIS — M1612 Unilateral primary osteoarthritis, left hip: Secondary | ICD-10-CM | POA: Diagnosis not present

## 2023-05-30 DIAGNOSIS — Z471 Aftercare following joint replacement surgery: Secondary | ICD-10-CM | POA: Diagnosis not present

## 2023-05-30 DIAGNOSIS — Z79899 Other long term (current) drug therapy: Secondary | ICD-10-CM | POA: Diagnosis not present

## 2023-05-30 DIAGNOSIS — Z96643 Presence of artificial hip joint, bilateral: Secondary | ICD-10-CM | POA: Diagnosis not present

## 2023-05-30 DIAGNOSIS — I10 Essential (primary) hypertension: Secondary | ICD-10-CM | POA: Insufficient documentation

## 2023-05-30 DIAGNOSIS — Z7984 Long term (current) use of oral hypoglycemic drugs: Secondary | ICD-10-CM | POA: Insufficient documentation

## 2023-05-30 DIAGNOSIS — Z96641 Presence of right artificial hip joint: Secondary | ICD-10-CM | POA: Insufficient documentation

## 2023-05-30 DIAGNOSIS — I4891 Unspecified atrial fibrillation: Secondary | ICD-10-CM | POA: Insufficient documentation

## 2023-05-30 DIAGNOSIS — Z96642 Presence of left artificial hip joint: Secondary | ICD-10-CM | POA: Diagnosis not present

## 2023-05-30 DIAGNOSIS — G4733 Obstructive sleep apnea (adult) (pediatric): Secondary | ICD-10-CM | POA: Diagnosis not present

## 2023-05-30 HISTORY — PX: TOTAL HIP ARTHROPLASTY: SHX124

## 2023-05-30 LAB — TYPE AND SCREEN
ABO/RH(D): O POS
Antibody Screen: NEGATIVE

## 2023-05-30 LAB — ABO/RH: ABO/RH(D): O POS

## 2023-05-30 SURGERY — ARTHROPLASTY, HIP, TOTAL, ANTERIOR APPROACH
Anesthesia: Monitor Anesthesia Care | Site: Hip | Laterality: Left

## 2023-05-30 MED ORDER — OXYCODONE HCL 5 MG/5ML PO SOLN: 5.0000 mg | Freq: Once | ORAL | Status: DC | PRN

## 2023-05-30 MED ORDER — SODIUM CHLORIDE 0.9 % IR SOLN
Status: DC | PRN
  Administered 2023-05-30: 1000 mL

## 2023-05-30 MED ORDER — 0.9 % SODIUM CHLORIDE (POUR BTL) OPTIME
TOPICAL | Status: DC | PRN
  Administered 2023-05-30: 1000 mL

## 2023-05-30 MED ORDER — OXYCODONE HCL 5 MG PO TABS: 10.0000 mg | ORAL_TABLET | ORAL | Status: DC | PRN

## 2023-05-30 MED ORDER — ALLOPURINOL 300 MG PO TABS
300.0000 mg | ORAL_TABLET | Freq: Every day | ORAL | Status: DC
  Administered 2023-05-30 – 2023-05-31 (×2): 300 mg via ORAL
  Filled 2023-05-30 (×2): qty 1

## 2023-05-30 MED ORDER — CHLORHEXIDINE GLUCONATE 0.12 % MT SOLN
15.0000 mL | Freq: Once | OROMUCOSAL | Status: AC
  Administered 2023-05-30: 15 mL via OROMUCOSAL

## 2023-05-30 MED ORDER — MIDAZOLAM HCL 2 MG/2ML IJ SOLN
INTRAMUSCULAR | Status: AC
  Filled 2023-05-30: qty 2

## 2023-05-30 MED ORDER — PROPOFOL 10 MG/ML IV BOLUS
INTRAVENOUS | Status: DC | PRN
  Administered 2023-05-30: 20 mg via INTRAVENOUS
  Administered 2023-05-30 (×2): 30 mg via INTRAVENOUS
  Administered 2023-05-30: 20 mg via INTRAVENOUS

## 2023-05-30 MED ORDER — CEFAZOLIN SODIUM-DEXTROSE 1-4 GM/50ML-% IV SOLN: 1.0000 g | Freq: Four times a day (QID) | INTRAVENOUS | Status: DC

## 2023-05-30 MED ORDER — OXYCODONE HCL 5 MG PO TABS: 5.0000 mg | ORAL_TABLET | Freq: Once | ORAL | Status: DC | PRN

## 2023-05-30 MED ORDER — VITAMIN E 45 MG (100 UNIT) PO CAPS
800.0000 [IU] | ORAL_CAPSULE | Freq: Every day | ORAL | Status: DC
  Administered 2023-05-31: 800 [IU] via ORAL
  Filled 2023-05-30: qty 8

## 2023-05-30 MED ORDER — SODIUM CHLORIDE 0.9 % IV SOLN: INTRAVENOUS | Status: DC

## 2023-05-30 MED ORDER — ORAL CARE MOUTH RINSE: 15.0000 mL | Freq: Once | OROMUCOSAL | Status: AC

## 2023-05-30 MED ORDER — ONDANSETRON HCL 4 MG/2ML IJ SOLN
INTRAMUSCULAR | Status: AC
  Filled 2023-05-30: qty 2

## 2023-05-30 MED ORDER — DEXAMETHASONE SODIUM PHOSPHATE 10 MG/ML IJ SOLN
INTRAMUSCULAR | Status: AC
  Filled 2023-05-30: qty 1

## 2023-05-30 MED ORDER — DOCUSATE SODIUM 100 MG PO CAPS
100.0000 mg | ORAL_CAPSULE | Freq: Two times a day (BID) | ORAL | Status: DC
  Administered 2023-05-30 – 2023-05-31 (×2): 100 mg via ORAL
  Filled 2023-05-30 (×2): qty 1

## 2023-05-30 MED ORDER — DIPHENHYDRAMINE HCL 12.5 MG/5ML PO ELIX: 12.5000 mg | ORAL_SOLUTION | ORAL | Status: DC | PRN

## 2023-05-30 MED ORDER — ALUM & MAG HYDROXIDE-SIMETH 200-200-20 MG/5ML PO SUSP: 30.0000 mL | ORAL | Status: DC | PRN

## 2023-05-30 MED ORDER — PROPOFOL 10 MG/ML IV BOLUS
INTRAVENOUS | Status: AC
  Filled 2023-05-30: qty 20

## 2023-05-30 MED ORDER — PANTOPRAZOLE SODIUM 40 MG PO TBEC
40.0000 mg | DELAYED_RELEASE_TABLET | Freq: Every day | ORAL | Status: DC
  Administered 2023-05-30 – 2023-05-31 (×2): 40 mg via ORAL
  Filled 2023-05-30 (×2): qty 1

## 2023-05-30 MED ORDER — LACTATED RINGERS IV SOLN: INTRAVENOUS | Status: DC

## 2023-05-30 MED ORDER — FENTANYL CITRATE PF 50 MCG/ML IJ SOSY: 25.0000 ug | PREFILLED_SYRINGE | INTRAMUSCULAR | Status: DC | PRN

## 2023-05-30 MED ORDER — METHOCARBAMOL 500 MG PO TABS
500.0000 mg | ORAL_TABLET | Freq: Four times a day (QID) | ORAL | Status: DC | PRN
  Administered 2023-05-30 – 2023-05-31 (×2): 500 mg via ORAL
  Filled 2023-05-30 (×2): qty 1

## 2023-05-30 MED ORDER — ASPIRIN 81 MG PO CHEW
81.0000 mg | CHEWABLE_TABLET | Freq: Two times a day (BID) | ORAL | Status: DC
  Administered 2023-05-30 – 2023-05-31 (×2): 81 mg via ORAL
  Filled 2023-05-30 (×2): qty 1

## 2023-05-30 MED ORDER — HYDROMORPHONE HCL 1 MG/ML IJ SOLN: 0.5000 mg | INTRAMUSCULAR | Status: DC | PRN

## 2023-05-30 MED ORDER — ONDANSETRON HCL 4 MG PO TABS: 4.0000 mg | ORAL_TABLET | Freq: Four times a day (QID) | ORAL | Status: DC | PRN

## 2023-05-30 MED ORDER — POVIDONE-IODINE 10 % EX SWAB: 2.0000 | Freq: Once | CUTANEOUS | Status: DC

## 2023-05-30 MED ORDER — BUPIVACAINE IN DEXTROSE 0.75-8.25 % IT SOLN
INTRATHECAL | Status: DC | PRN
  Administered 2023-05-30: 1.8 mL via INTRATHECAL

## 2023-05-30 MED ORDER — ORAL CARE MOUTH RINSE: 15.0000 mL | OROMUCOSAL | Status: DC | PRN

## 2023-05-30 MED ORDER — VITAMIN C 500 MG PO TABS
1000.0000 mg | ORAL_TABLET | Freq: Every day | ORAL | Status: DC
  Administered 2023-05-30 – 2023-05-31 (×2): 1000 mg via ORAL
  Filled 2023-05-30 (×2): qty 2

## 2023-05-30 MED ORDER — OXYCODONE HCL 5 MG PO TABS
5.0000 mg | ORAL_TABLET | ORAL | Status: DC | PRN
  Administered 2023-05-31: 5 mg via ORAL
  Filled 2023-05-30 (×2): qty 2

## 2023-05-30 MED ORDER — ONDANSETRON HCL 4 MG/2ML IJ SOLN
INTRAMUSCULAR | Status: DC | PRN
  Administered 2023-05-30: 4 mg via INTRAVENOUS

## 2023-05-30 MED ORDER — PROPOFOL 500 MG/50ML IV EMUL
INTRAVENOUS | Status: DC | PRN
  Administered 2023-05-30: 75 ug/kg/min via INTRAVENOUS

## 2023-05-30 MED ORDER — ACETAMINOPHEN 10 MG/ML IV SOLN: 1000.0000 mg | Freq: Once | INTRAVENOUS | Status: DC | PRN

## 2023-05-30 MED ORDER — CEFAZOLIN SODIUM-DEXTROSE 2-4 GM/100ML-% IV SOLN
2.0000 g | INTRAVENOUS | Status: AC
  Administered 2023-05-30: 2 g via INTRAVENOUS
  Filled 2023-05-30: qty 100

## 2023-05-30 MED ORDER — METOCLOPRAMIDE HCL 5 MG PO TABS: 5.0000 mg | ORAL_TABLET | Freq: Three times a day (TID) | ORAL | Status: DC | PRN

## 2023-05-30 MED ORDER — ACETAMINOPHEN 325 MG PO TABS: 325.0000 mg | ORAL_TABLET | Freq: Four times a day (QID) | ORAL | Status: DC | PRN

## 2023-05-30 MED ORDER — PHENYLEPHRINE 80 MCG/ML (10ML) SYRINGE FOR IV PUSH (FOR BLOOD PRESSURE SUPPORT)
PREFILLED_SYRINGE | INTRAVENOUS | Status: DC | PRN
  Administered 2023-05-30: 80 ug via INTRAVENOUS
  Administered 2023-05-30: 160 ug via INTRAVENOUS
  Administered 2023-05-30 (×3): 80 ug via INTRAVENOUS

## 2023-05-30 MED ORDER — VITAMIN C 500 MG PO TABS: 1000.0000 mg | ORAL_TABLET | Freq: Every day | ORAL | Status: DC

## 2023-05-30 MED ORDER — VITAMIN D 25 MCG (1000 UNIT) PO TABS: 10000.0000 [IU] | ORAL_TABLET | Freq: Every day | ORAL | Status: DC

## 2023-05-30 MED ORDER — PHENOL 1.4 % MT LIQD: 1.0000 | OROMUCOSAL | Status: DC | PRN

## 2023-05-30 MED ORDER — STERILE WATER FOR IRRIGATION IR SOLN
Status: DC | PRN
  Administered 2023-05-30: 2000 mL

## 2023-05-30 MED ORDER — VITAMIN D3 250 MCG (10000 UT) PO TABS: 10000.0000 [IU] | ORAL_TABLET | Freq: Every day | ORAL | Status: DC

## 2023-05-30 MED ORDER — METOCLOPRAMIDE HCL 5 MG/ML IJ SOLN: 5.0000 mg | Freq: Three times a day (TID) | INTRAMUSCULAR | Status: DC | PRN

## 2023-05-30 MED ORDER — ACETAMINOPHEN 10 MG/ML IV SOLN
INTRAVENOUS | Status: DC | PRN
  Administered 2023-05-30: 1000 mg via INTRAVENOUS

## 2023-05-30 MED ORDER — ACETAMINOPHEN 10 MG/ML IV SOLN
INTRAVENOUS | Status: AC
  Filled 2023-05-30: qty 100

## 2023-05-30 MED ORDER — VITAMIN E 45 MG (100 UNIT) PO CAPS: 1000.0000 [IU] | ORAL_CAPSULE | Freq: Every day | ORAL | Status: DC

## 2023-05-30 MED ORDER — MENTHOL 3 MG MT LOZG: 1.0000 | LOZENGE | OROMUCOSAL | Status: DC | PRN

## 2023-05-30 MED ORDER — FENTANYL CITRATE (PF) 100 MCG/2ML IJ SOLN
INTRAMUSCULAR | Status: DC | PRN
  Administered 2023-05-30: 100 ug via INTRAVENOUS

## 2023-05-30 MED ORDER — TRANEXAMIC ACID-NACL 1000-0.7 MG/100ML-% IV SOLN
1000.0000 mg | INTRAVENOUS | Status: AC
  Administered 2023-05-30: 1000 mg via INTRAVENOUS
  Filled 2023-05-30: qty 100

## 2023-05-30 MED ORDER — CEFAZOLIN SODIUM-DEXTROSE 1-4 GM/50ML-% IV SOLN
1.0000 g | Freq: Four times a day (QID) | INTRAVENOUS | Status: AC
  Administered 2023-05-30 (×2): 1 g via INTRAVENOUS
  Filled 2023-05-30 (×2): qty 50

## 2023-05-30 MED ORDER — ACETAMINOPHEN 160 MG/5ML PO SOLN: 1000.0000 mg | Freq: Once | ORAL | Status: DC | PRN

## 2023-05-30 MED ORDER — PROPOFOL 500 MG/50ML IV EMUL
INTRAVENOUS | Status: AC
  Filled 2023-05-30: qty 50

## 2023-05-30 MED ORDER — ONDANSETRON HCL 4 MG/2ML IJ SOLN: 4.0000 mg | Freq: Four times a day (QID) | INTRAMUSCULAR | Status: DC | PRN

## 2023-05-30 MED ORDER — FENTANYL CITRATE (PF) 100 MCG/2ML IJ SOLN
INTRAMUSCULAR | Status: AC
  Filled 2023-05-30: qty 2

## 2023-05-30 MED ORDER — METFORMIN HCL ER 500 MG PO TB24
1000.0000 mg | ORAL_TABLET | Freq: Two times a day (BID) | ORAL | Status: DC
  Administered 2023-05-30 – 2023-05-31 (×2): 1000 mg via ORAL
  Filled 2023-05-30 (×2): qty 2

## 2023-05-30 MED ORDER — ACETAMINOPHEN 500 MG PO TABS: 1000.0000 mg | ORAL_TABLET | Freq: Once | ORAL | Status: DC | PRN

## 2023-05-30 MED ORDER — METHOCARBAMOL 500 MG IVPB - SIMPLE MED: 500.0000 mg | Freq: Four times a day (QID) | INTRAVENOUS | Status: DC | PRN

## 2023-05-30 MED ORDER — MIDAZOLAM HCL 2 MG/2ML IJ SOLN
INTRAMUSCULAR | Status: DC | PRN
  Administered 2023-05-30: 2 mg via INTRAVENOUS

## 2023-05-30 MED ORDER — DEXAMETHASONE SODIUM PHOSPHATE 10 MG/ML IJ SOLN
INTRAMUSCULAR | Status: DC | PRN
  Administered 2023-05-30: 5 mg via INTRAVENOUS

## 2023-05-30 MED ORDER — PHENYLEPHRINE 80 MCG/ML (10ML) SYRINGE FOR IV PUSH (FOR BLOOD PRESSURE SUPPORT)
PREFILLED_SYRINGE | INTRAVENOUS | Status: AC
  Filled 2023-05-30: qty 10

## 2023-05-30 SURGICAL SUPPLY — 42 items
APL SKNCLS STERI-STRIP NONHPOA (GAUZE/BANDAGES/DRESSINGS)
BAG COUNTER SPONGE SURGICOUNT (BAG) ×2 IMPLANT
BAG SPEC THK2 15X12 ZIP CLS (MISCELLANEOUS)
BAG SPNG CNTER NS LX DISP (BAG) ×1
BAG ZIPLOCK 12X15 (MISCELLANEOUS) IMPLANT
BENZOIN TINCTURE PRP APPL 2/3 (GAUZE/BANDAGES/DRESSINGS) IMPLANT
BLADE SAW SGTL 18X1.27X75 (BLADE) ×2 IMPLANT
COVER PERINEAL POST (MISCELLANEOUS) ×2 IMPLANT
COVER SURGICAL LIGHT HANDLE (MISCELLANEOUS) ×2 IMPLANT
CUP ACET PINNACLE SECTR 60MM (Hips) IMPLANT
DRAPE FOOT SWITCH (DRAPES) ×2 IMPLANT
DRAPE STERI IOBAN 125X83 (DRAPES) ×2 IMPLANT
DRAPE U-SHAPE 47X51 STRL (DRAPES) ×4 IMPLANT
DRSG AQUACEL AG ADV 3.5X10 (GAUZE/BANDAGES/DRESSINGS) ×2 IMPLANT
DURAPREP 26ML APPLICATOR (WOUND CARE) ×2 IMPLANT
ELECT REM PT RETURN 15FT ADLT (MISCELLANEOUS) ×2 IMPLANT
GAUZE XEROFORM 1X8 LF (GAUZE/BANDAGES/DRESSINGS) IMPLANT
GLOVE BIO SURGEON STRL SZ7.5 (GLOVE) ×2 IMPLANT
GLOVE BIOGEL PI IND STRL 8 (GLOVE) ×4 IMPLANT
GLOVE ECLIPSE 8.0 STRL XLNG CF (GLOVE) ×2 IMPLANT
GOWN STRL REUS W/ TWL XL LVL3 (GOWN DISPOSABLE) ×4 IMPLANT
GOWN STRL REUS W/TWL XL LVL3 (GOWN DISPOSABLE) ×2
HANDPIECE INTERPULSE COAX TIP (DISPOSABLE) ×1
HEAD CERAMIC 36 PLUS 8.5 12 14 (Hips) IMPLANT
HOLDER FOLEY CATH W/STRAP (MISCELLANEOUS) ×2 IMPLANT
KIT TURNOVER KIT A (KITS) IMPLANT
LINER NEUTRAL 58X36MM PLUS4 IMPLANT
PACK ANTERIOR HIP CUSTOM (KITS) ×2 IMPLANT
PINNSECTOR W/GRIP ACE CUP 60MM (Hips) ×1 IMPLANT
SET HNDPC FAN SPRY TIP SCT (DISPOSABLE) ×2 IMPLANT
STAPLER VISISTAT 35W (STAPLE) IMPLANT
STEM FEMORAL SZ9 HIGH ACTIS (Stem) IMPLANT
STRIP CLOSURE SKIN 1/2X4 (GAUZE/BANDAGES/DRESSINGS) IMPLANT
SUT ETHIBOND NAB CT1 #1 30IN (SUTURE) ×2 IMPLANT
SUT ETHILON 2 0 PS N (SUTURE) IMPLANT
SUT MNCRL AB 4-0 PS2 18 (SUTURE) IMPLANT
SUT VIC AB 0 CT1 36 (SUTURE) ×2 IMPLANT
SUT VIC AB 1 CT1 36 (SUTURE) ×2 IMPLANT
SUT VIC AB 2-0 CT1 27 (SUTURE) ×2
SUT VIC AB 2-0 CT1 TAPERPNT 27 (SUTURE) ×4 IMPLANT
TRAY FOLEY MTR SLVR 16FR STAT (SET/KITS/TRAYS/PACK) IMPLANT
YANKAUER SUCT BULB TIP NO VENT (SUCTIONS) ×2 IMPLANT

## 2023-05-30 NOTE — Anesthesia Postprocedure Evaluation (Signed)
Anesthesia Post Note  Patient: Harold Merino, MD  Procedure(s) Performed: LEFT TOTAL HIP ARTHROPLASTY ANTERIOR APPROACH (Left: Hip)     Patient location during evaluation: PACU Anesthesia Type: MAC and Spinal Level of consciousness: awake and alert Pain management: pain level controlled Vital Signs Assessment: post-procedure vital signs reviewed and stable Respiratory status: spontaneous breathing, nonlabored ventilation and respiratory function stable Cardiovascular status: stable and blood pressure returned to baseline Postop Assessment: no apparent nausea or vomiting Anesthetic complications: no   No notable events documented.  Last Vitals:  Vitals:   05/30/23 1046 05/30/23 1301  BP: 137/85 (!) 144/91  Pulse: (!) 58 89  Resp: 14 16  Temp: 36.7 C 36.9 C  SpO2: 96% 96%    Last Pain:  Vitals:   05/30/23 1301  TempSrc: Oral  PainSc:                  Harold Davis

## 2023-05-30 NOTE — Anesthesia Preprocedure Evaluation (Addendum)
Anesthesia Evaluation  Patient identified by MRN, date of birth, ID band Patient awake    Reviewed: Allergy & Precautions, NPO status , Patient's Chart, lab work & pertinent test results  History of Anesthesia Complications Negative for: history of anesthetic complications  Airway Mallampati: III  TM Distance: >3 FB Neck ROM: Full    Dental  (+) Dental Advisory Given, Missing   Pulmonary neg shortness of breath, neg COPD, neg recent URI   breath sounds clear to auscultation       Cardiovascular hypertension, (-) angina (-) Past MI and (-) CHF  Rhythm:Irregular     Neuro/Psych negative neurological ROS  negative psych ROS   GI/Hepatic negative GI ROS, Neg liver ROS,,,  Endo/Other  negative endocrine ROS    Renal/GU negative Renal ROS     Musculoskeletal  (+) Arthritis ,    Abdominal   Peds  Hematology Lab Results      Component                Value               Date                      WBC                      6.3                 05/21/2023                HGB                      14.0                05/21/2023                HCT                      43.6                05/21/2023                MCV                      94.6                05/21/2023                PLT                      250                 05/21/2023            Denies anticoags    Anesthesia Other Findings   Reproductive/Obstetrics                             Anesthesia Physical Anesthesia Plan  ASA: 2  Anesthesia Plan: MAC and Spinal   Post-op Pain Management: Ofirmev IV (intra-op)*   Induction: Intravenous  PONV Risk Score and Plan: 1 and Propofol infusion  Airway Management Planned: Nasal Cannula, Natural Airway and Simple Face Mask  Additional Equipment: None  Intra-op Plan:   Post-operative Plan:   Informed Consent: I have reviewed the patients History and Physical, chart, labs and discussed the  procedure including the  risks, benefits and alternatives for the proposed anesthesia with the patient or authorized representative who has indicated his/her understanding and acceptance.     Dental advisory given  Plan Discussed with: CRNA  Anesthesia Plan Comments:         Anesthesia Quick Evaluation

## 2023-05-30 NOTE — Anesthesia Procedure Notes (Addendum)
Spinal  Patient location during procedure: OR Start time: 05/30/2023 7:15 AM End time: 05/30/2023 7:25 AM Reason for block: surgical anesthesia Staffing Performed: anesthesiologist  Anesthesiologist: Val Eagle, MD Performed by: Val Eagle, MD Authorized by: Val Eagle, MD   Preanesthetic Checklist Completed: patient identified, IV checked, risks and benefits discussed, surgical consent, monitors and equipment checked, pre-op evaluation and timeout performed Spinal Block Patient position: sitting Prep: DuraPrep Patient monitoring: heart rate, cardiac monitor, continuous pulse ox and blood pressure Approach: midline Location: L4-5 Injection technique: single-shot Needle Needle type: Pencan  Needle gauge: 24 G Needle length: 9 cm Assessment Sensory level: T6 Events: CSF return and second provider Additional Notes L3/4 attempt by CRNA prior to L/45 attempt by MDA

## 2023-05-30 NOTE — Anesthesia Procedure Notes (Signed)
Procedure Name: MAC Date/Time: 05/30/2023 7:15 AM  Performed by: Elyn Peers, CRNAPre-anesthesia Checklist: Patient identified, Emergency Drugs available, Suction available, Patient being monitored and Timeout performed Oxygen Delivery Method: Simple face mask Placement Confirmation: positive ETCO2

## 2023-05-30 NOTE — TOC Transition Note (Signed)
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  Pt has needed DME in the home.  HHPT prearranged with [facility] via MD office.  No TOC needs.  Final next level of care: Home w Home Health Services Barriers to Discharge: No Barriers Identified   Patient Goals and CMS Choice      Discharge Placement                         Discharge Plan and Services Additional resources added to the After Visit Summary for                  DME Arranged: N/A DME Agency: NA       HH Arranged: PT HH Agency: [facility]        Social Determinants of Health (SDOH) Interventions SDOH Screenings   Food Insecurity: No Food Insecurity (05/30/2023)  Housing: Low Risk  (05/30/2023)  Transportation Needs: No Transportation Needs (05/30/2023)  Utilities: Not At Risk (05/30/2023)  Depression (PHQ2-9): Low Risk  (04/12/2023)  Tobacco Use: Low Risk  (05/30/2023)     Readmission Risk Interventions     No data to display

## 2023-05-30 NOTE — Interval H&P Note (Signed)
History and Physical Interval Note: The patient is here today for a left total hip replacement to treat his severe left hip arthritis.  There has been no acute change in his medical status.  The risks and benefits of surgery have been discussed in detail and informed consent has been obtained.  The left operative hip has been marked.  05/30/2023 7:00 AM  [name], MD  has presented today for surgery, with the diagnosis of osteoarthritis left hip.  The various methods of treatment have been discussed with the patient and family. After consideration of risks, benefits and other options for treatment, the patient has consented to  Procedure(s): LEFT TOTAL HIP ARTHROPLASTY ANTERIOR APPROACH (Left) as a surgical intervention.  The patient's history has been reviewed, patient examined, no change in status, stable for surgery.  I have reviewed the patient's chart and labs.  Questions were answered to the patient's satisfaction.     Harold Davis

## 2023-05-30 NOTE — Op Note (Signed)
Operative Note  Date of operation: 05/30/2023 Preoperative diagnosis: Left hip primary osteoarthritis Postoperative diagnosis: Same  Procedure: Left direct anterior total hip arthroplasty  Implants: Implant Name Type Inv. Item Serial No. Manufacturer Lot No. LRB No. Used Action  LINER NEUTRAL [id number] PLUS4 - [id number]  LINER NEUTRAL 58X36MM PLUS4  DEPU[employer]TH[employer] [id number] Left 1 Implanted  PINNSECTOR W/GRIP ACE CUP - [id number] Hips PINNSECTOR W/GRIP ACE CUP  DEPU[employer]TH[employer] [id number] Left 1 Implanted  STEM FEMORAL SZ9 HIGH ACTIS - [id number] Stem STEM FEMORAL SZ9 HIGH ACTIS  DEPUY ORTHOPAEDICS [id number] Left 1 Implanted  HEAD CERAMIC 36 PLUS 8.5 12 14  - [id number] Hips HEAD CERAMIC 36 PLUS 8.5 12 14   DEPUY [employer]TH[employer] [id number] Left 1 Implanted   Surgeon: Vanita Panda. 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  We actually replaced his right hip successfully in 2020 secondary to severe osteoarthritis.  He has tried and failed conservative treatment.  He is incredibly active and is actually lost a significant amount of weight.  His left hip pain has become daily.  He does walk with a limp.  It is detrimentally affecting his mobility, his quality of life and his actives daily living to the point he wishes to proceed with a hip replacement on this left side and I agree with this as well based on his clinical exam and x-ray findings showing bone-on-bone wear of the left hip.  Having had this before he is fully aware of the risk of acute blood loss anemia, nerve vessel injury, fracture, infection, DVT, dislocation, implant failure, leg length differences and wound healing issues.  He understands her goals are hopefully decrease pain, improve mobility, and improve quality of life.  Procedure  description: After informed consent was obtained and the appropriate left hip was marked, the patient was brought to the operating room and set up on the stretcher where spinal anesthesia was obtained.  He was then laid in supine position on stretcher.  A Foley catheter was placed.  I was able to assess his leg lengths and placed traction boots on both his feet.  He was then placed supine on the Hana fracture table with a perineal post in place in both legs and inline skeletal traction devices but no traction applied.  His left operative hip and pelvis were assessed radiographically.  The left hip was then prepped and draped with DuraPrep and sterile drapes.  A timeout was called and he was identified as the correct patient and the correct left hip.  An incision was made just inferior and posterior to the ASIS and carried slightly obliquely down the leg.  Dissection was carried down to the tensor fascia lata muscle and the tensor fascia was divided longitudinally to proceed with a direct interposed the hip.  Circumflex vessels were identified and cauterized.  The hip capsule was identified and opened up in L-type format.  You could see significant arthritis along the lateral femoral head neck and likely had evidence of femoral acetabular impingement at 1 point.  Cobra retractors were placed around the medial and lateral femoral neck and a femoral neck cut was made with an oscillating saw just proximal to the lesser trochanter and the scope was completed with an osteotome.  A corkscrew guide was placed in the femoral head and the femoral head was removed its entirety and there  was a wide area devoid of cartilage and completely polished down.  A bent Hohmann was then placed over the medial acetabular rim and remnants of the acetabular labrum and other debris removed.  We found significant sclerotic warn bone and cartilage in the socket.  We then began reaming under direct visualization from a size 43 reamer and  stepwise increments going all the way up to a size 59 reamer with all reamers placed under direct visualization and the last reamer placed under direct fluoroscopy in order to obtain the depth and reaming, the inclination and the anteversion.  The real DePuy sector GRIPTION acetabular opponent size 60 was then placed without difficulty followed by a 36+4 polyethylene liner.  Attention was then turned to the femur.  With the left leg externally rotated to 120 degrees, extended and adducted, a Mueller retractor was placed medially and a Hohmann retractor was placed behind the greater trochanter.  The lateral joint capsule was released and a box cutting osteotome was used to enter the femoral canal.  Broaching was then initiated using the Actis broaching system from a size 0 going all the way to a size 9.  With a size 9 in place we trialed a high offset femoral neck and a 36+1.5 trial hip ball.  The leg was brought over and up and with traction and internal rotation reduced in the pelvis.  Based on offset and leg length assessment radiographically and clinically we need more leg length.  The hip was dislocated and we removed the trial components.  We then placed the real Actis femoral component with high offset size 9 and went with a 36+8.5 ceramic head ball.  Again this was reduced and acetabulum and we are pleased with leg length, offset, range of motion and stability assessed radiographically and clinically.  The soft tissue was then irrigated with normal saline solution.  The joint capsule was closed with interrupted #1 Ethibond suture followed by #1 Vicryl close the tensor fascia.  0 Vicryl was used to close deep tissue and 2-0 Vicryl was used to close subcutaneous tissue.  The skin was closed with staples.  He was taken off the Hana table and taken the recovery room in stable condition.  Rexene Edison, PA-C did assist during the entire case and beginning to end and his assistance was crucial and medically necessary  for soft tissue management and retraction, helping guide implant placement and a layered closure of the wound.

## 2023-05-30 NOTE — Transfer of Care (Signed)
Immediate Anesthesia Transfer of Care Note  Patient: Harold Merino, MD  Procedure(s) Performed: LEFT TOTAL HIP ARTHROPLASTY ANTERIOR APPROACH (Left: Hip)  Patient Location: PACU  Anesthesia Type:Spinal  Level of Consciousness: sedated  Airway & Oxygen Therapy: Patient Spontanous Breathing and Patient connected to face mask oxygen  Post-op Assessment: Report given to RN and Post -op Vital signs reviewed and stable  Post vital signs: Reviewed and stable  Last Vitals:  Vitals Value Taken Time  BP 90/66 05/30/23 0852  Temp 37 C 05/30/23 0852  Pulse 91 05/30/23 0855  Resp 14 05/30/23 0855  SpO2 100 % 05/30/23 0855  Vitals shown include unfiled device data.  Last Pain:  Vitals:   05/30/23 0600  TempSrc: Oral  PainSc:          Complications: No notable events documented.

## 2023-05-30 NOTE — Evaluation (Signed)
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 Pt PMH includes but is not limited to: gout, OSA on CPAP, A-fib, Hld, HTN, diverticulosis, and R THA (2020).  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 Patient reports IND with mobility at baseline. Patient is now limited by functional impairments (see PT problem list below) and requires CGA for bed mobility and CGA and cues for transfers. Patient was able to ambulate 60 feet with RW and CGA level of assist. Patient instructed in exercise to facilitate ROM and circulation to manage edema. Patient will benefit from continued skilled PT interventions to address impairments and progress towards PLOF. Acute PT will follow to progress mobility and stair training in preparation for safe discharge home with social support and [facility] services.       If plan is discharge home, recommend the following: A little help with walking and/or transfers;A little help with bathing/dressing/bathroom;Assistance with cooking/housework;Assist for transportation;Help with stairs or ramp for entrance   Can travel by private vehicle        Equipment Recommendations None recommended by PT  Recommendations for Other Services       Functional Status Assessment Patient has had a recent decline in their functional status and demonstrates the ability to make significant improvements in function in a reasonable and predictable amount of time.     Precautions / Restrictions Precautions Precautions: Fall Restrictions Weight Bearing Restrictions: No      Mobility  Bed Mobility Overal bed mobility: Needs Assistance Bed Mobility: Supine to Sit     Supine to sit: Contact guard, HOB elevated     General bed mobility  comments: increased time and min cues    Transfers Overall transfer level: Needs assistance Equipment used: Rolling walker (2 wheels) Transfers: Sit to/from Stand Sit to Stand: Contact guard assist, From elevated surface           General transfer comment: min cues for safety    Ambulation/Gait Ambulation/Gait assistance: Contact guard assist Gait Distance (Feet): 60 Feet Assistive device: Rolling walker (2 wheels) Gait Pattern/deviations: Step-to pattern, Antalgic Gait velocity: decreased     General Gait Details: cues for maintaining RW on floor for more fluid gait pattern and energy conservation  Stairs            Wheelchair Mobility     Tilt Bed    Modified Rankin (Stroke Patients Only)       Balance Overall balance assessment: Needs assistance Sitting-balance support: Feet supported Sitting balance-Leahy Scale: Good     Standing balance support: Bilateral upper extremity supported, During functional activity, Reliant on assistive device for balance Standing balance-Leahy Scale: Poor                               Pertinent Vitals/Pain Pain Assessment Pain Assessment: 0-10 Pain Score: 4  Pain Location: L hip and leg Pain Descriptors / Indicators: Burning, Aching, Operative site guarding, Discomfort (stinging) Pain Intervention(s): Limited activity within patient's tolerance, Monitored during session, Premedicated before session, Repositioned, Heat applied    Home Living Family/patient expects to be discharged to:: Private residence Living Arrangements: Alone Available Help at Discharge: Friend(s) Type of Home: [facility]  Access: Stairs to enter Entrance Stairs-Rails: None Entrance Stairs-Number of Steps: 2   Home Layout: Multi-level;Able to live on main level with bedroom/bathroom Home Equipment: Rolling Walker (2 wheels);Shower seat;BSC/3in1;Shower seat - built in;Cane - single point      Prior Function Prior Level of Function  : Independent/Modified Independent;Driving             Mobility Comments: IND no AD       Extremity/Trunk Assessment        Lower Extremity Assessment Lower Extremity Assessment: LLE deficits/detail LLE Deficits / Details: ankle DF/PF 5/5 LLE Sensation: WNL    Cervical / Trunk Assessment Cervical / Trunk Assessment: Normal  Communication   Communication Communication: No apparent difficulties  Cognition Arousal: Alert Behavior During Therapy: WFL for tasks assessed/performed Overall Cognitive Status: Within Functional Limits for tasks assessed                                          General Comments      Exercises Total Joint Exercises Ankle Circles/Pumps: PROM, Both, 20 reps   Assessment/Plan    PT Assessment Patient needs continued PT services  PT Problem List Decreased strength;Decreased range of motion;Decreased activity tolerance;Decreased balance;Decreased mobility;Decreased coordination;Pain       PT Treatment Interventions DME instruction;Gait training;Stair training;Functional mobility training;Therapeutic activities;Balance training;Neuromuscular re-education;Therapeutic exercise;Patient/family education;Modalities    PT Goals (Current goals can be found in the Care Plan section)  Acute Rehab PT Goals Patient Stated Goal: return to riding a bicycle, walking uneven surfaces and lifting PT Goal Formulation: With patient Time For Goal Achievement: 06/13/23 Potential to Achieve Goals: Good    Frequency 7X/week     Co-evaluation               AM-PAC PT "6 Clicks" Mobility  Outcome Measure Help needed turning from your back to your side while in a flat bed without using bedrails?: None Help needed moving from lying on your back to sitting on the side of a flat bed without using bedrails?: A Little Help needed moving to and from a bed to a chair (including a wheelchair)?: A Little Help needed standing up from a chair using  your arms (e.g., wheelchair or bedside chair)?: A Little Help needed to walk in hospital room?: A Little Help needed climbing 3-5 steps with a railing? : A Lot 6 Click Score: 18    End of Session Equipment Utilized During Treatment: Gait belt Activity Tolerance: Patient tolerated treatment well Patient left: in chair;with call bell/phone within reach;with chair alarm set Nurse Communication: Mobility status PT Visit Diagnosis: Unsteadiness on feet (R26.81);Other abnormalities of gait and mobility (R26.89);History of falling (Z91.81);Pain;Difficulty in walking, not elsewhere classified (R26.2) Pain - Right/Left: Left Pain - part of body: Leg;Hip    Time: 1610-9604 PT Time Calculation (min) (ACUTE ONLY): 24 min   Charges:   PT Evaluation $PT Eval Low Complexity: 1 Low PT Treatments $Gait Training: 8-22 mins PT General Charges $$ ACUTE PT VISIT: 1 Visit         Johnny Bridge, PT Acute Rehab   Jacqualyn Posey 05/30/2023, 4:04 PM

## 2023-05-31 ENCOUNTER — Other Ambulatory Visit (HOSPITAL_COMMUNITY): Payer: Self-pay

## 2023-05-31 DIAGNOSIS — M1612 Unilateral primary osteoarthritis, left hip: Secondary | ICD-10-CM | POA: Diagnosis not present

## 2023-05-31 LAB — CBC
HCT: 38.8 % — ABNORMAL LOW (ref 39.0–52.0)
Hemoglobin: 12.7 g/dL — ABNORMAL LOW (ref 13.0–17.0)
MCH: 30.8 pg (ref 26.0–34.0)
MCHC: 32.7 g/dL (ref 30.0–36.0)
MCV: 94.2 fL (ref 80.0–100.0)
Platelets: 215 10*3/uL (ref 150–400)
RBC: 4.12 MIL/uL — ABNORMAL LOW (ref 4.22–5.81)
RDW: 13.8 % (ref 11.5–15.5)
WBC: 14.5 10*3/uL — ABNORMAL HIGH (ref 4.0–10.5)
nRBC: 0 % (ref 0.0–0.2)

## 2023-05-31 LAB — BASIC METABOLIC PANEL
Anion gap: 10 (ref 5–15)
BUN: 18 mg/dL (ref 8–23)
CO2: 27 mmol/L (ref 22–32)
Calcium: 9.2 mg/dL (ref 8.9–10.3)
Chloride: 102 mmol/L (ref 98–111)
Creatinine, Ser: 0.91 mg/dL (ref 0.61–1.24)
GFR, Estimated: >60 mL/min (ref >60)
Glucose, Bld: 130 mg/dL — ABNORMAL HIGH (ref 70–99)
Potassium: 3.8 mmol/L (ref 3.5–5.1)
Sodium: 139 mmol/L (ref 135–145)

## 2023-05-31 MED ORDER — METHOCARBAMOL 500 MG PO TABS
500.0000 mg | ORAL_TABLET | Freq: Four times a day (QID) | ORAL | 1 refills | Status: DC | PRN
  Filled 2023-05-31: qty 40, 10d supply, fill #0

## 2023-05-31 MED ORDER — ASPIRIN EC 81 MG PO TBEC: 81.0000 mg | DELAYED_RELEASE_TABLET | Freq: Two times a day (BID) | ORAL | Status: AC

## 2023-05-31 MED ORDER — OXYCODONE HCL 5 MG PO TABS
5.0000 mg | ORAL_TABLET | Freq: Four times a day (QID) | ORAL | 0 refills | Status: DC | PRN
  Filled 2023-05-31: qty 30, 4d supply, fill #0

## 2023-05-31 NOTE — Progress Notes (Signed)
Subjective: 1 Day Post-Op Procedure(s) (LRB): LEFT TOTAL HIP ARTHROPLASTY ANTERIOR APPROACH (Left) Patient reports pain as moderate.    Objective: Vital signs in last 24 hours: Temp:  [98 F (36.7 C)-99.9 F (37.7 C)] 99.1 F (37.3 C) (08/31 0558) Pulse Rate:  [58-97] 97 (08/31 0558) Resp:  [14-17] 17 (08/31 0558) BP: (133-162)/(66-102) 133/66 (08/31 0558) SpO2:  [95 %-98 %] 95 % (08/31 0558)  Intake/Output from previous day: 08/30 0701 - 08/31 0700 In: 2489.2 [I.V.:2089.2; IV Piggyback:400] Out: 6010 [Urine:5860; Blood:150] Intake/Output this shift: No intake/output data recorded.  Recent Labs    05/31/23 0343  HGB 12.7*   Recent Labs    05/31/23 0343  WBC 14.5*  RBC 4.12*  HCT 38.8*  PLT 215   Recent Labs    05/31/23 0343  NA 139  K 3.8  CL 102  CO2 27  BUN 18  CREATININE 0.91  GLUCOSE 130*  CALCIUM 9.2   No results for input(s): "LABPT", "INR" in the last 72 hours.  Sensation intact distally Intact pulses distally Dorsiflexion/Plantar flexion intact Incision: dressing C/D/I   Assessment/Plan: 1 Day Post-Op Procedure(s) (LRB): LEFT TOTAL HIP ARTHROPLASTY ANTERIOR APPROACH (Left) Up with therapy Discharge home with home health      Kathryne Hitch 05/31/2023, 10:37 AM

## 2023-05-31 NOTE — Discharge Instructions (Signed)

## 2023-05-31 NOTE — Progress Notes (Signed)
AVS given to pt.  All questions answered.  Pt had all belongings.  Pt stable at time of discharge.  

## 2023-05-31 NOTE — Plan of Care (Signed)
  Problem: Education: Goal: Knowledge of the prescribed therapeutic regimen will improve Outcome: Progressing Goal: Understanding of discharge needs will improve Outcome: Progressing   Problem: Activity: Goal: Ability to avoid complications of mobility impairment will improve Outcome: Progressing Goal: Ability to tolerate increased activity will improve Outcome: Progressing   Problem: Clinical Measurements: Goal: Postoperative complications will be avoided or minimized Outcome: Progressing   Problem: Pain Management: Goal: Pain level will decrease with appropriate interventions Outcome: Progressing   Problem: Skin Integrity: Goal: Will show signs of wound healing Outcome: Progressing   Problem: Health Behavior/Discharge Planning: Goal: Ability to manage health-related needs will improve Outcome: Progressing   Problem: Clinical Measurements: Goal: Ability to maintain clinical measurements within normal limits will improve Outcome: Progressing Goal: Will remain free from infection Outcome: Progressing Goal: Diagnostic test results will improve Outcome: Progressing Goal: Respiratory complications will improve Outcome: Progressing Goal: Cardiovascular complication will be avoided Outcome: Progressing   Problem: Activity: Goal: Risk for activity intolerance will decrease Outcome: Progressing   Problem: Nutrition: Goal: Adequate nutrition will be maintained Outcome: Progressing   Problem: Coping: Goal: Level of anxiety will decrease Outcome: Progressing   Problem: Elimination: Goal: Will not experience complications related to bowel motility Outcome: Progressing Goal: Will not experience complications related to urinary retention Outcome: Progressing   Problem: Pain Managment: Goal: General experience of comfort will improve Outcome: Progressing   Problem: Safety: Goal: Ability to remain free from injury will improve Outcome: Progressing   Problem: Skin  Integrity: Goal: Risk for impaired skin integrity will decrease Outcome: Progressing

## 2023-05-31 NOTE — Progress Notes (Signed)
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 Pt PMH includes but is not limited to: gout, OSA on CPAP, A-fib, Hld, HTN, diverticulosis, and R THA (2020).    PT Comments  Pt is making excellent progress,meeting PT goals. Reviewed THA HEP handout and progression, reviewed use of gait belt as leg lifter for bed mobility and exercises as needed. Pt is ready to d/c from PT standpoint with family assist as needed.     If plan is discharge home, recommend the following: A little help with walking and/or transfers;A little help with bathing/dressing/bathroom;Assistance with cooking/housework;Assist for transportation;Help with stairs or ramp for entrance   Can travel by private vehicle        Equipment Recommendations  None recommended by PT    Recommendations for Other Services       Precautions / Restrictions Precautions Precautions: Fall Restrictions Weight Bearing Restrictions: No LLE Weight Bearing: Weight bearing as tolerated     Mobility  Bed Mobility               General bed mobility comments: in recliner on arrival and departure    Transfers Overall transfer level: Needs assistance Equipment used: Rolling walker (2 wheels) Transfers: Sit to/from Stand Sit to Stand: Supervision, Modified independent (Device/Increase time)           General transfer comment: cues for hand placement and overall safety    Ambulation/Gait Ambulation/Gait assistance: Supervision Gait Distance (Feet): 100 Feet Assistive device: Rolling walker (2 wheels) Gait Pattern/deviations: Step-to pattern, Antalgic, Step-through pattern Gait velocity: decreased     General Gait Details: cues for maintaining RW on floor for more fluid gait pattern and energy  conservation   Stairs Stairs: Yes Stairs assistance: Contact guard assist Stair Management: No rails, Step to pattern, Forwards, With walker Number of Stairs: 1 General stair comments: cues for technique and sequence, CGA for safety. no LOB   Wheelchair Mobility     Tilt Bed    Modified Rankin (Stroke Patients Only)       Balance Overall balance assessment: Needs assistance Sitting-balance support: Feet supported Sitting balance-Leahy Scale: Good     Standing balance support: Bilateral upper extremity supported, During functional activity, Reliant on assistive device for balance Standing balance-Leahy Scale: Poor                              Cognition Arousal: Alert Behavior During Therapy: WFL for tasks assessed/performed Overall Cognitive Status: Within Functional Limits for tasks assessed                                          Exercises Total Joint Exercises Ankle Circles/Pumps: PROM, Both, 20 reps Quad Sets:  (reviewed) Heel Slides: AAROM, Left (x3 with use of gait belt) Hip ABduction/ADduction: AAROM, Left (3)    General Comments        Pertinent Vitals/Pain Pain Assessment Pain Assessment: 0-10 Pain Score: 4  Pain Location: L hip Pain Descriptors / Indicators: Burning, Aching, Discomfort Pain Intervention(s): Limited activity within patient's tolerance, Monitored during session, Premedicated before session, Repositioned    Home Living  Prior Function            PT Goals (current goals can now be found in the care plan section) Acute Rehab PT Goals Patient Stated Goal: return to riding a bicycle, walking uneven surfaces and lifting PT Goal Formulation: With patient Time For Goal Achievement: 06/13/23 Potential to Achieve Goals: Good    Frequency    7X/week      PT Plan      Co-evaluation              AM-PAC PT "6 Clicks" Mobility   Outcome Measure  Help  needed turning from your back to your side while in a flat bed without using bedrails?: None Help needed moving from lying on your back to sitting on the side of a flat bed without using bedrails?: A Little Help needed moving to and from a bed to a chair (including a wheelchair)?: None Help needed standing up from a chair using your arms (e.g., wheelchair or bedside chair)?: None Help needed to walk in hospital room?: A Little Help needed climbing 3-5 steps with a railing? : A Lot 6 Click Score: 20    End of Session Equipment Utilized During Treatment: Gait belt Activity Tolerance: Patient tolerated treatment well Patient left: in chair;with call bell/phone within reach;with chair alarm set Nurse Communication: Mobility status PT Visit Diagnosis: Unsteadiness on feet (R26.81);Other abnormalities of gait and mobility (R26.89);History of falling (Z91.81);Pain;Difficulty in walking, not elsewhere classified (R26.2) Pain - Right/Left: Left Pain - part of body: Leg;Hip     Time: 1025-1100 PT Time Calculation (min) (ACUTE ONLY): 35 min  Charges:    $Gait Training: 23-37 mins PT General Charges $$ ACUTE PT VISIT: 1 Visit                     Emmagene Ortner, PT  Acute Rehab Dept ([facility]) [phone]  05/31/2023    [facility] 05/31/2023, 12:02 PM

## 2023-05-31 NOTE — Discharge Summary (Signed)
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  Admit date: 05/30/2023 Discharge date: 05/31/2023  Admission Diagnoses:  Principal Problem:   Unilateral primary osteoarthritis, left hip Active Problems:   Status post total replacement of left hip   Discharge Diagnoses:  Same  Past Medical History:  Diagnosis Date   Cataract 2008   os   Cataract 2017   od   Degenerative joint disease (DJD) of hip    Diverticulitis large intestine    History of acute pancreatitis 01/15/2018   History of diverticulitis 01/15/2018   Hx of vitrectomy Sept 2010 Left eye  02/22/2015   Retinal detachment surgery per Fawn Kirk    Hyperlipidemia 11/2017   LDL 108   Hypertension 2009   started HCTZ & switched to Norvasc in Jan 2019   Pancreatitis 09/2017   Retinal detachment of left eye with single retinal tear 2010   Vitrectomy - Dr Luciana Axe   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 Patient has severe unremitting pain that affects sleep, daily activities, and work/hobbies. After pre-op clearance the patient was taken to the operating room on 05/30/2023 and underwent  Procedure(s): LEFT TOTAL HIP ARTHROPLASTY ANTERIOR APPROACH.    Patient was given perioperative antibiotics:  Anti-infectives (From admission, onward)    Start     Dose/Rate Route Frequency Ordered Stop   05/30/23 1400  ceFAZolin (ANCEF) IVPB 1 g/50 mL premix        1 g 100 mL/hr over 30 Minutes Intravenous Every 6 hours 05/30/23 1048 05/30/23 2139   05/30/23 1130  ceFAZolin (ANCEF) IVPB 1 g/50 mL premix  Status:  Discontinued        1 g 100 mL/hr over 30 Minutes Intravenous Every 6 hours 05/30/23  1039 05/30/23 1048   05/30/23 0600  ceFAZolin (ANCEF) IVPB 2g/100 mL premix        2 g 200 mL/hr over 30 Minutes Intravenous On call to O.R. 05/30/23 0530 05/30/23 0741        Patient was given sequential compression devices, early ambulation, and chemoprophylaxis to prevent DVT.  Patient benefited maximally from hospital stay and there were no complications.    Recent vital signs: Patient Vitals for the past 24 hrs:  BP Temp Temp src Pulse Resp SpO2  05/31/23 1056 (!) 147/95 99 F (37.2 C) Oral 66 16 99 %  05/31/23 0558 133/66 99.1 F (37.3 C) Oral 97 17 95 %  05/31/23 0138 (!) 145/92 99.9 F (37.7 C) Oral -- 17 98 %  05/30/23 2140 (!) 157/87 98.3 F (36.8 C) Oral 66 17 98 %  05/30/23 1739 (!) 162/102 99.1 F (37.3 C) Oral 81 15 95 %  05/30/23 1301 (!) 144/91 98.4 F (36.9 C) Oral 89 16 96 %     Recent laboratory studies:  Recent Labs    05/31/23 0343  WBC 14.5*  HGB 12.7*  HCT 38.8*  PLT 215  NA 139  K 3.8  CL 102  CO2 27  BUN 18  CREATININE 0.91  GLUCOSE 130*  CALCIUM 9.2     Discharge Medications:   Allergies as of 05/31/2023   No Known Allergies      Medication List  TAKE these medications    allopurinol 300 MG tablet Commonly known as: ZYLOPRIM Take 1 tablet (300 mg total) by mouth daily to prevent gout.   aspirin EC 81 MG tablet Take 1 tablet (81 mg total) by mouth 2 (two) times daily. Takes 1 tablet Daily What changed:  how much to take how to take this when to take this   FISH OIL PO Take 1 Dose by mouth daily. Liquid   LECITHIN PO Take 1 tablet by mouth every other day.   metFORMIN 500 MG 24 hr tablet Commonly known as: GLUCOPHAGE-XR Take 2 tablets (1,000 mg total) by mouth 2 (two) times daily with meals for diabetes.   methocarbamol 500 MG tablet Commonly known as: ROBAXIN Take 1 tablet (500 mg total) by mouth every 6 (six) hours as needed for muscle spasms.   oxyCODONE 5 MG immediate release tablet Commonly known as:  Oxy IR/ROXICODONE Take 1-2 tablets (5-10 mg total) by mouth every 6 (six) hours as needed for moderate pain (pain score 4-6).   VITAMIN A PO Take 1 tablet by mouth every 30 (thirty) days.   vitamin C 1000 MG tablet Take 1,000 mg by mouth daily.   Vitamin D3 250 MCG (10000 UT) Tabs Take 10,000 Units by mouth daily.   vitamin E 1000 UNIT capsule Take 1 capsule (1,000 Units total) by mouth daily.   zinc gluconate 50 MG tablet Takes 1 tablet every other day               Durable Medical Equipment  (From admission, onward)           Start     Ordered   05/30/23 1040  DME 3 n 1  Once        05/30/23 1039   05/30/23 1040  DME Walker rolling  Once       Question Answer Comment  Walker: With 5 Inch Wheels   Patient needs a walker to treat with the following condition Status post total replacement of left hip      05/30/23 1039            Diagnostic Studies: DG HIP UNILAT WITH PELVIS 1V LEFT  Result Date: 05/30/2023 CLINICAL DATA:  Left hip surgery. EXAM: DG HIP (WITH OR WITHOUT PELVIS) 1V*L* COMPARISON:  Left hip radiographs 04/14/2023 FLUOROSCOPY: Radiation Exposure Index (as provided by the fluoroscopic device): 3.1 mGy Kerma FINDINGS: Three intraoperative spot fluoroscopic images are provided during performance of a left total hip arthroplasty. The prosthetic components appear normally aligned on these limited images. A pre-existing right hip arthroplasty is again noted. IMPRESSION: Intraoperative images during left total hip arthroplasty. Electronically Signed   By: Sebastian Ache M.D.   On: 05/30/2023 09:45   DG Pelvis Portable  Result Date: 05/30/2023 CLINICAL DATA:  [id number] Status post total replacement of left hip [id number] EXAM: PORTABLE PELVIS 1-2 VIEWS COMPARISON:  05/02/2022. FINDINGS: Pelvis is intact with normal and symmetric sacroiliac joints. No acute fracture or dislocation. No aggressive osseous lesion. Visualized sacral arcuate lines are unremarkable.  Unremarkable symphysis pubis. 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 No periprosthetic fracture or lucency. There is near anatomic alignment. Redemonstration of right total hip arthroplasty The hardware is intact. No periprosthetic fracture or lucency. No interval change in alignment, when compared to the available recent prior examination. No radiopaque foreign bodies. IMPRESSION: *Baseline examination status post left total hip arthroplasty. No radiographic evidence of hardware complication. Electronically Signed  By: Jules Schick M.D.   On: 05/30/2023 09:38   DG C-Arm 1-60 Min-No Report  Result Date: 05/30/2023 Fluoroscopy was utilized by the requesting physician.  No radiographic interpretation.    Disposition: Discharge disposition: 01-Home or Self Care          Follow-up Information     [facility] Follow up.   Specialty: Home Health Services Why: to provide home physical therapy visits Contact information: [address] [place] [address] [phone]         Kathryne Hitch, MD Follow up in 2 week(s).   Specialty: Orthopedic Surgery Contact information: [address] [place] [address] [phone]                  Signed: Kathryne Hitch 05/31/2023, 11:00 AM

## 2023-06-01 DIAGNOSIS — Z471 Aftercare following joint replacement surgery: Secondary | ICD-10-CM | POA: Diagnosis not present

## 2023-06-01 DIAGNOSIS — G4733 Obstructive sleep apnea (adult) (pediatric): Secondary | ICD-10-CM | POA: Diagnosis not present

## 2023-06-01 DIAGNOSIS — M109 Gout, unspecified: Secondary | ICD-10-CM | POA: Diagnosis not present

## 2023-06-01 DIAGNOSIS — K5730 Diverticulosis of large intestine without perforation or abscess without bleeding: Secondary | ICD-10-CM | POA: Diagnosis not present

## 2023-06-01 DIAGNOSIS — I10 Essential (primary) hypertension: Secondary | ICD-10-CM | POA: Diagnosis not present

## 2023-06-01 DIAGNOSIS — Z96641 Presence of right artificial hip joint: Secondary | ICD-10-CM | POA: Diagnosis not present

## 2023-06-01 DIAGNOSIS — I4891 Unspecified atrial fibrillation: Secondary | ICD-10-CM | POA: Diagnosis not present

## 2023-06-01 DIAGNOSIS — E782 Mixed hyperlipidemia: Secondary | ICD-10-CM | POA: Diagnosis not present

## 2023-06-01 DIAGNOSIS — Z96642 Presence of left artificial hip joint: Secondary | ICD-10-CM | POA: Diagnosis not present

## 2023-06-01 DIAGNOSIS — M1712 Unilateral primary osteoarthritis, left knee: Secondary | ICD-10-CM | POA: Diagnosis not present

## 2023-06-01 DIAGNOSIS — Z6831 Body mass index (BMI) 31.0-31.9, adult: Secondary | ICD-10-CM | POA: Diagnosis not present

## 2023-06-01 DIAGNOSIS — Z7982 Long term (current) use of aspirin: Secondary | ICD-10-CM | POA: Diagnosis not present

## 2023-06-01 DIAGNOSIS — E559 Vitamin D deficiency, unspecified: Secondary | ICD-10-CM | POA: Diagnosis not present

## 2023-06-01 DIAGNOSIS — Z9181 History of falling: Secondary | ICD-10-CM | POA: Diagnosis not present

## 2023-06-01 DIAGNOSIS — K5900 Constipation, unspecified: Secondary | ICD-10-CM | POA: Diagnosis not present

## 2023-06-01 DIAGNOSIS — H3322 Serous retinal detachment, left eye: Secondary | ICD-10-CM | POA: Diagnosis not present

## 2023-06-01 DIAGNOSIS — H269 Unspecified cataract: Secondary | ICD-10-CM | POA: Diagnosis not present

## 2023-06-01 DIAGNOSIS — Z7984 Long term (current) use of oral hypoglycemic drugs: Secondary | ICD-10-CM | POA: Diagnosis not present

## 2023-06-03 ENCOUNTER — Telehealth: Payer: Self-pay | Admitting: *Deleted

## 2023-06-03 ENCOUNTER — Encounter (HOSPITAL_COMMUNITY): Payer: Self-pay | Admitting: Orthopaedic Surgery

## 2023-06-03 NOTE — Telephone Encounter (Signed)
D/C call to patient; doing very well overall. No new needs today.

## 2023-06-12 ENCOUNTER — Ambulatory Visit (INDEPENDENT_AMBULATORY_CARE_PROVIDER_SITE_OTHER): Payer: PPO | Admitting: Orthopaedic Surgery

## 2023-06-12 ENCOUNTER — Encounter: Payer: Self-pay | Admitting: Orthopaedic Surgery

## 2023-06-12 DIAGNOSIS — Z96642 Presence of left artificial hip joint: Secondary | ICD-10-CM

## 2023-06-12 NOTE — Progress Notes (Signed)
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  We have replaced his right hip several years ago.  He is doing great overall.  He is already not taking any pain medications.  He is ambulating with a cane.  His left hip incision looks good.  The staples were removed and strips was applied.  He does have some swelling in his left leg but his calf is soft.  He has been compliant with a baby aspirin twice a day.  He was on it once a day prior to this and he will go back to baby aspirin once a day.  He knows to increase his activities as comfort allows.  Will see him back in a month to see how he is doing overall from a mobility standpoint but no x-rays are needed.

## 2023-06-17 ENCOUNTER — Other Ambulatory Visit (HOSPITAL_COMMUNITY): Payer: Self-pay

## 2023-06-17 MED ORDER — CLOBETASOL PROPIONATE 0.05 % EX CREA
TOPICAL_CREAM | CUTANEOUS | 1 refills | Status: AC
  Filled 2023-06-17: qty 30, 10d supply, fill #0

## 2023-07-10 ENCOUNTER — Encounter: Payer: Self-pay | Admitting: Orthopaedic Surgery

## 2023-07-10 ENCOUNTER — Ambulatory Visit: Payer: PPO | Admitting: Orthopaedic Surgery

## 2023-07-10 DIAGNOSIS — Z96642 Presence of left artificial hip joint: Secondary | ICD-10-CM

## 2023-07-10 NOTE — Progress Notes (Signed)
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  We replaced his right hip several years ago.  His only complaint has been a severe contact dermatitis that he developed from the dressing and probably the ChloraPrep as well.  He says he still having some itching and had occasional hives spreading around his body and his torso.  He said they are better today and heat from the shower has been helpful.  He said the hip is doing great.  He is walking without an assistive device.  He has been very active.  His right hip moves smoothly and fluidly as does his left hip which is his operative hip.  His left hip incision looks good.  The contact dermatitis around the hip incision is resolving.  From my standpoint the next time I need to see him is not for 6 months unless there is any issues.  Will have a standing low AP pelvis and a lateral of his more recent operative left hip at that visit.  He understands if there is issues before then he knows to contact me directly.

## 2023-07-16 ENCOUNTER — Telehealth: Payer: Self-pay | Admitting: *Deleted

## 2023-07-16 NOTE — Telephone Encounter (Signed)
(  Late entry for 06/09/23) Call to patient to check status. Doing well overall. No issues or needs. HHPT going well.

## 2023-07-17 NOTE — Progress Notes (Signed)
6 MONTH FOLLOW UP Assessment:   Harold Davis was seen today for medicare wellness.  Diagnoses and all orders for this visit:  Essential hypertension Controlled without medication Monitor blood pressure at home; call if consistently over 130/80 Continue DASH diet.   Reminder to go to the ER if any CP, SOB, nausea, dizziness, severe HA, changes vision/speech, left arm numbness and tingling and jaw pain. 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 Continue exercises and follow with orthopedics  Lumbar pain with radiation down left leg Continue antigravity tilt table Continue stretching Avoid heavy lifting Follow with ortho as needed  Morbid obesity - BMI 30+ with OSA Long discussion about weight loss, diet, and exercise Recommended diet heavy in fruits and veggies and low in animal meats, cheeses, and dairy products, appropriate calorie intake Discussed appropriate weight for height  Follow up at next visit  Hyperlipidemia, unspecified hyperlipidemia type Mild elevations not requiring statin therapy; lifestyle only at this time Continue low cholesterol diet and exercise.  Monitor routinely -Lipid panel   Vitamin D deficiency Continue supplementation for goal of 60-100 Defer vitamin D level  Medication Management Medications reviewed and Questions answered  - Magnesium  A. Fib with controlled ventricular response (HCC) Discussed chadsvasc score of 2; 2.9% risk of clot/CVA, would recommend anticoagulation, Dr. Graciela Husbands recommended CV Patient has considered at length, states he understands risks (patient is surgeon/MD) and has decided to  NOT PURSUE He monitors via apple watch, taking ASA 81. Understands this is not proven to reduce risk of a. Fib related clots patient to go to ER if there is weakness, thunderclap headache, visual changes, or any concerning factors Continue to monitor;      Over 30 minutes of exam, counseling, chart review, and critical decision making was performed  Future Appointments  Date Time Provider Department Center  07/18/2023  9:30 AM Raynelle Dick, NP [facility] None  10/23/2023  9:30 AM Lucky Cowboy, MD [facility] None  01/08/2024  1:00 PM Kathryne Hitch, MD [facility] None  01/22/2024  9:30 AM Raynelle Dick, NP [facility] None  04/28/2024 10:00 AM Lucky Cowboy, MD [facility] None        Subjective:  Harold Merino, 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   He had elective R hip THA by Dr. Magnus Ivan in 01/2019 and has done well since (mild residual numbness of the thigh). He had a left THA by Dr. Magnus Ivan on 05/30/23. He did have some PT at home.  Doing well .  Had to wash with chlorhexidine for 4 nights before that. Bandage was in place for 2 weeks and after bandage removed area became red and pruritic and developed blisters. Used clobetasol. Has resolved. Pruritus and redness spread to trunk, arms and legs to knees. Allergic to chlorhexidine. Resolved by week 4. Doubled baby aspirin for 4 weeks and is now back to daily  MRI of spine 12/2021- L4-5 compression. He had previous sciatica of left leg. Sciatica has resolved but having pain in the  right knee.    He was found to be in asymptomatic persistent a. Fib at preop screening, ECHO 02/15/2019 showed Persistent Afib, Normal Heart Valves, Normal Ejection Fraction (60-65%), mild -moderate Atrial dilation consequent of the Afib and was referred to  cardiology, saw Dr. Graciela Husbands who transitioned to apixoban 5 mg BID (Cha2ds2-vasc of 2),  now only taking a baby ASA. He will get occasional edema of legs.  Sleep study by Dr. Mayford Knife which found mod/severe OSA and was initiated on CPAP, not using CPAP currently  Patient preferred to avoid NOAC (has seen numerous complications with patients personally), and today shares he has considered risk of CVA (2.9% per chadsvasc score), after understanding risks, has decided wouldn't pursue aggressive treatment either way. Declines follow up with cardiology, would not pursue DCCV, declines NOAC. Tracking with apple watch.   With hx/o Colon Ca in both parents and last Colon in 2006, had negative and colonoscopy 12/10/2019 Dr Matthias Hughs for colonoscopy which showed 1 polyp and widespread colonic diverticuloses. Repeat is due 2026.   BMI is Body mass index is 33.07 kg/m., he has not been working on diet and exercise as much.  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 More carbohydrates recently, trying to start decreasing. Careful when lifting 50 lb bas of feed.  Wt Readings from Last 3 Encounters:  07/18/23 244 lb 3.2 oz (110.8 kg)  05/30/23 237 lb (107.5 kg)  05/21/23 237 lb (107.5 kg)   His blood pressure has been controlled at home by HCTZ 25 mg daily, today their BP is BP: 136/78 BP Readings from Last 3 Encounters:  07/18/23 136/78  05/31/23 (!) 147/95  05/21/23 (!) 152/105   He does workout. He denies chest pain, shortness of breath, dizziness.   He is not on cholesterol medication and denies myalgias. His cholesterol is not at goal, mild LDL elevation. Does not want to use statin medication. He is trying to eat less red meat.  The cholesterol last visit was:   Lab Results  Component Value Date   CHOL 153 04/08/2023   HDL 45 04/08/2023   LDLCALC 94 04/08/2023   TRIG 49 04/08/2023   CHOLHDL 3.4 04/08/2023    He has been working on diet and exercise for prediabetes, and denies  increased appetite, nausea, paresthesia of the feet, polydipsia, polyuria and visual disturbances. He is taking Metformin 500 mg QAM Last A1C in the office was:  Lab Results  Component Value Date   HGBA1C 5.7 (H) 04/08/2023   Last GFR Lab Results  Component Value Date   EGFR 78 04/08/2023    Patient is on Vitamin D supplement, taking 10272 IU daily:  Lab Results  Component Value Date   VD25OH 56 04/08/2023      He has not had a recent gout flare. Continues to take allopurinol daily Lab Results  Component Value Date   LABURIC 5.2 04/08/2023     Medication Review:   Current Outpatient Medications (Endocrine & Metabolic):    metFORMIN (GLUCOPHAGE-XR) 500 MG 24 hr tablet, Take 2 tablets (1,000 mg total) by mouth 2 (two) times daily with meals for diabetes.    Current Outpatient Medications (Analgesics):    allopurinol (ZYLOPRIM) 300 MG tablet, Take 1 tablet (300 mg total) by mouth daily to prevent gout.   aspirin EC 81 MG tablet, Take 1 tablet (81 mg total) by mouth 2 (two) times daily. Takes 1 tablet Daily   oxyCODONE (OXY IR/ROXICODONE) 5 MG immediate release  tablet, Take 1-2 tablets (5-10 mg total) by mouth every 6 (six) hours as needed for moderate pain (pain score 4-6).   Current Outpatient Medications (Other):    Ascorbic Acid (VITAMIN C) 1000 MG tablet, Take 1,000 mg by mouth daily.   Cholecalciferol (VITAMIN D3) 10000 units TABS, Take 10,000 Units by mouth daily.   clobetasol cream (TEMOVATE) 0.05 %, Apply to affected areas 2 times a day for 14 days   LECITHIN PO, Take 1 tablet by mouth every other day.   methocarbamol (ROBAXIN) 500 MG tablet, Take 1 tablet (500 mg total) by mouth every 6 (six) hours as needed for muscle spasms.   Omega-3 Fatty Acids (FISH OIL PO), Take 1 Dose by mouth daily. Liquid   VITAMIN A PO, Take 1 tablet by mouth every 30 (thirty) days.   vitamin E 1000 UNIT capsule, Take 1 capsule (1,000 Units total) by mouth daily.   zinc gluconate 50 MG  tablet, Takes 1 tablet every other day  Allergies: No Known Allergies  Current Problems (verified) has Essential hypertension; Sigmoid diverticulosis; Morbid obesity (HCC) - BMI 30+ with OSA; Hyperlipidemia, mixed; Vitamin D deficiency; Atrial fibrillation with controlled ventricular response (HCC); OSA on CPAP; Gout; Unilateral primary osteoarthritis, left knee; and Status post total replacement of left hip on their problem list.  Screening Tests Immunization History  Administered Date(s) Administered   Influenza-Unspecified 06/30/2020, 07/05/2021   PFIZER(Purple Top)SARS-COV-2 Vaccination 09/19/2019, 10/07/2019   Tdap 03/12/2021    Preventative care: Last colonoscopy: last 11/2019, getting every 5 years by Dr. Matthias Hughs, diverticulosis and 1 polyp (benign)  Prior vaccinations: TD or Tdap: 2005 declines   Influenza: 2020 via hospital   Pneumococcal: declines  Prevnar13: declines Shingles/Zostavax: declines shingrix  Covid 19: 2/2, 2021 pfizer  Names of Other Physician/Practitioners you currently use: 1. [facility] here for primary care 2. Dr. Dagoberto Ligas, eye doctor, last visit 2021, needs to follow up   3. Dr. Luciana Axe, dentist, last visit 2020  Patient Care Team: Lucky Cowboy, MD as PCP - General (Internal Medicine)  Surgical: He  has a past surgical history that includes EUS (N/A, 02/04/2018); Eye surgery (Bilateral); Total hip arthroplasty (Right, 02/19/2019); and Total hip arthroplasty (Left, 05/30/2023). Family His family history includes Cancer in his brother, father, mother, and sister; Heart disease in his brother; Stroke in his mother. Social history  He reports that he has never smoked. He has never used smokeless tobacco. He reports current alcohol use. He reports that he does not use drugs.     Objective:   Today's Vitals   07/18/23 0918  BP: 136/78  Pulse: 62  Resp: 16  Temp: 98 F (36.7 C)  SpO2: 98%  Weight: 244  lb 3.2 oz (110.8 kg)  Height: 6' 0.05" (1.83 m)      Body mass index is 33.07 kg/m.  Eyes: PERRLA, EOMs, conjunctiva no swelling or erythema Sinuses: No frontal/maxillary tenderness ENT/Mouth: EACs patent / TMs  nl. Nares clear without erythema, swelling, mucoid exudates. Oral hygiene is good. No erythema, swelling, or exudate. Tongue normal, non-obstructing. Hearing normal.  Neck: Supple, thyroid not palpable.  Respiratory: Respiratory effort normal.  BS equal and clear bilateral without rales, rhonci, wheezing or stridor. Cardio: Heart sounds irregularly irregular and no murmurs. Peripheral pulses are normal and equal bilaterally without edema Chest: symmetric with normal excursions and percussion.  Abdomen: Soft, with Nl bowel sounds.  Lymphatics: Non tender without lymphadenopathy.  Musculoskeletal: Full ROM all peripheral extremities, joint stability,  5/5 strength, and slight antalgic gait. Skin: Warm and dry . Left anterior THA incision is well healed, no erythema, warmth. Neuro: Cranial nerves intact, reflexes diminished left patellar normal right.  Normal muscle tone, no cerebellar symptoms. Sensation intact Pysch: Alert and oriented X 3 with normal affect, insight and judgment appropriate.     Raynelle Dick, NP   07/18/2023

## 2023-07-18 ENCOUNTER — Ambulatory Visit (INDEPENDENT_AMBULATORY_CARE_PROVIDER_SITE_OTHER): Payer: PPO | Admitting: Nurse Practitioner

## 2023-07-18 ENCOUNTER — Encounter: Payer: Self-pay | Admitting: Nurse Practitioner

## 2023-07-18 VITALS — BP 136/78 | HR 62 | Temp 98.0°F | Resp 16 | Ht 72.05 in | Wt 244.2 lb

## 2023-07-18 DIAGNOSIS — K5730 Diverticulosis of large intestine without perforation or abscess without bleeding: Secondary | ICD-10-CM | POA: Diagnosis not present

## 2023-07-18 DIAGNOSIS — M545 Low back pain, unspecified: Secondary | ICD-10-CM | POA: Diagnosis not present

## 2023-07-18 DIAGNOSIS — Z79899 Other long term (current) drug therapy: Secondary | ICD-10-CM | POA: Diagnosis not present

## 2023-07-18 DIAGNOSIS — N183 Chronic kidney disease, stage 3 unspecified: Secondary | ICD-10-CM | POA: Diagnosis not present

## 2023-07-18 DIAGNOSIS — E782 Mixed hyperlipidemia: Secondary | ICD-10-CM

## 2023-07-18 DIAGNOSIS — Z96642 Presence of left artificial hip joint: Secondary | ICD-10-CM | POA: Diagnosis not present

## 2023-07-18 DIAGNOSIS — I480 Paroxysmal atrial fibrillation: Secondary | ICD-10-CM

## 2023-07-18 DIAGNOSIS — M79605 Pain in left leg: Secondary | ICD-10-CM | POA: Diagnosis not present

## 2023-07-18 DIAGNOSIS — E559 Vitamin D deficiency, unspecified: Secondary | ICD-10-CM

## 2023-07-18 DIAGNOSIS — I10 Essential (primary) hypertension: Secondary | ICD-10-CM | POA: Diagnosis not present

## 2023-07-18 DIAGNOSIS — R7309 Other abnormal glucose: Secondary | ICD-10-CM

## 2023-07-18 NOTE — Patient Instructions (Signed)

## 2023-07-19 LAB — CBC WITH DIFFERENTIAL/PLATELET
Absolute Lymphocytes: 1392 {cells}/uL (ref 850–3900)
Absolute Monocytes: 650 {cells}/uL (ref 200–950)
Basophils Absolute: 70 {cells}/uL (ref 0–200)
Basophils Relative: 1.2 %
Eosinophils Absolute: 302 {cells}/uL (ref 15–500)
Eosinophils Relative: 5.2 %
HCT: 42.6 % (ref 38.5–50.0)
Hemoglobin: 13.8 g/dL (ref 13.2–17.1)
MCH: 30.6 pg (ref 27.0–33.0)
MCHC: 32.4 g/dL (ref 32.0–36.0)
MCV: 94.5 fL (ref 80.0–100.0)
MPV: 11.1 fL (ref 7.5–12.5)
Monocytes Relative: 11.2 %
Neutro Abs: 3387 {cells}/uL (ref 1500–7800)
Neutrophils Relative %: 58.4 %
Platelets: 287 10*3/uL (ref 140–400)
RBC: 4.51 10*6/uL (ref 4.20–5.80)
RDW: 13.3 % (ref 11.0–15.0)
Total Lymphocyte: 24 %
WBC: 5.8 10*3/uL (ref 3.8–10.8)

## 2023-07-19 LAB — COMPLETE METABOLIC PANEL WITH GFR
AG Ratio: 1.7 (calc) (ref 1.0–2.5)
ALT: 15 U/L (ref 9–46)
AST: 19 U/L (ref 10–35)
Albumin: 4.6 g/dL (ref 3.6–5.1)
Alkaline phosphatase (APISO): 93 U/L (ref 35–144)
BUN: 21 mg/dL (ref 7–25)
CO2: 31 mmol/L (ref 20–32)
Calcium: 9.8 mg/dL (ref 8.6–10.3)
Chloride: 105 mmol/L (ref 98–110)
Creat: 0.9 mg/dL (ref 0.70–1.28)
Globulin: 2.7 g/dL (ref 1.9–3.7)
Glucose, Bld: 97 mg/dL (ref 65–99)
Potassium: 4.7 mmol/L (ref 3.5–5.3)
Sodium: 143 mmol/L (ref 135–146)
Total Bilirubin: 0.7 mg/dL (ref 0.2–1.2)
Total Protein: 7.3 g/dL (ref 6.1–8.1)
eGFR: 92 mL/min/{1.73_m2} (ref >=60)

## 2023-07-19 LAB — LIPID PANEL
Cholesterol: 167 mg/dL (ref <200)
HDL: 49 mg/dL (ref >=40)
LDL Cholesterol (Calc): 105 mg/dL — ABNORMAL HIGH
Non-HDL Cholesterol (Calc): 118 mg/dL (ref <130)
Total CHOL/HDL Ratio: 3.4 (calc) (ref <5.0)
Triglycerides: 44 mg/dL (ref <150)

## 2023-07-19 LAB — MAGNESIUM: Magnesium: 2.2 mg/dL (ref 1.5–2.5)

## 2023-09-08 ENCOUNTER — Other Ambulatory Visit (HOSPITAL_COMMUNITY): Payer: Self-pay

## 2023-09-19 ENCOUNTER — Telehealth: Payer: Self-pay | Admitting: *Deleted

## 2023-09-19 NOTE — Telephone Encounter (Signed)
Ortho bundle 90 day call completed; doing well. No further CM needs.

## 2023-10-23 ENCOUNTER — Ambulatory Visit: Payer: PPO | Admitting: Internal Medicine

## 2023-11-12 ENCOUNTER — Telehealth: Payer: Self-pay | Admitting: Internal Medicine

## 2023-11-12 NOTE — Telephone Encounter (Signed)
Copied from CRM [id number]. Topic: General - Other >> Nov 12, 2023 10:24 AM Deaijah H wrote: Reason for CRM: Mr. Pompei would like to speak with Dr. Shirl Harris about becoming a new patient with him. Did advise that he is not currently seeing new patients, but still would like to speak with him / please call [phone].  ---  Are you willing to take this person as a New Patient? TDW

## 2023-11-13 NOTE — Telephone Encounter (Signed)
I have accepted Dr [name] already. Pls sch w/me. Thx

## 2023-12-02 ENCOUNTER — Other Ambulatory Visit (HOSPITAL_COMMUNITY): Payer: Self-pay

## 2023-12-02 ENCOUNTER — Ambulatory Visit (INDEPENDENT_AMBULATORY_CARE_PROVIDER_SITE_OTHER): Payer: PPO | Admitting: Internal Medicine

## 2023-12-02 ENCOUNTER — Encounter: Payer: Self-pay | Admitting: Internal Medicine

## 2023-12-02 VITALS — BP 122/72 | HR 74 | Temp 98.8°F | Ht 72.05 in | Wt 249.0 lb

## 2023-12-02 DIAGNOSIS — M48061 Spinal stenosis, lumbar region without neurogenic claudication: Secondary | ICD-10-CM | POA: Diagnosis not present

## 2023-12-02 DIAGNOSIS — M10079 Idiopathic gout, unspecified ankle and foot: Secondary | ICD-10-CM

## 2023-12-02 DIAGNOSIS — E1165 Type 2 diabetes mellitus with hyperglycemia: Secondary | ICD-10-CM | POA: Insufficient documentation

## 2023-12-02 DIAGNOSIS — K5732 Diverticulitis of large intestine without perforation or abscess without bleeding: Secondary | ICD-10-CM

## 2023-12-02 DIAGNOSIS — I4891 Unspecified atrial fibrillation: Secondary | ICD-10-CM | POA: Diagnosis not present

## 2023-12-02 DIAGNOSIS — Z0000 Encounter for general adult medical examination without abnormal findings: Secondary | ICD-10-CM

## 2023-12-02 DIAGNOSIS — E559 Vitamin D deficiency, unspecified: Secondary | ICD-10-CM | POA: Diagnosis not present

## 2023-12-02 DIAGNOSIS — I10 Essential (primary) hypertension: Secondary | ICD-10-CM | POA: Diagnosis not present

## 2023-12-02 DIAGNOSIS — G4733 Obstructive sleep apnea (adult) (pediatric): Secondary | ICD-10-CM

## 2023-12-02 DIAGNOSIS — D500 Iron deficiency anemia secondary to blood loss (chronic): Secondary | ICD-10-CM

## 2023-12-02 DIAGNOSIS — Z8719 Personal history of other diseases of the digestive system: Secondary | ICD-10-CM

## 2023-12-02 DIAGNOSIS — E782 Mixed hyperlipidemia: Secondary | ICD-10-CM | POA: Diagnosis not present

## 2023-12-02 MED ORDER — ALLOPURINOL 300 MG PO TABS
300.0000 mg | ORAL_TABLET | Freq: Every day | ORAL | 3 refills | Status: AC
  Filled 2023-12-02: qty 90, 90d supply, fill #0
  Filled 2024-03-11: qty 90, 90d supply, fill #1
  Filled 2024-06-04: qty 90, 90d supply, fill #2
  Filled 2024-09-02: qty 90, 90d supply, fill #3

## 2023-12-02 NOTE — Assessment & Plan Note (Addendum)
 No relapse.  On Allopurinol Obtain uric acid

## 2023-12-02 NOTE — Assessment & Plan Note (Addendum)
 PAF Follow-up with Dr. Graciela Husbands if problems.  Cha2ds2-vasc of 2, elequis and DCCV were recommended by Dr. Graciela Husbands but patient ultimately declined Sleep study found OSA, did start on CPAP

## 2023-12-02 NOTE — Progress Notes (Signed)
 Subjective:  Patient ID: Harold Merino, MD, male    DOB: August 07, 1951  Age: 73 y.o. MRN: [id number]  CC: New Patient (Initial Visit)   HPI Harold Merino, MD presents for a new pt - well exam Retired in 2024 Lost 30 lbs. C/o diverticulosis - 2004 diverticulitis ?OSA - was on CPAP S/p B THR Spinal stenosis DM   Outpatient Medications Prior to Visit  Medication Sig Dispense Refill   Ascorbic Acid (VITAMIN C) 1000 MG tablet Take 1,000 mg by mouth daily.     aspirin EC 81 MG tablet Take 1 tablet (81 mg total) by mouth 2 (two) times daily. Takes 1 tablet Daily     Cholecalciferol (VITAMIN D3) 10000 units TABS Take 10,000 Units by mouth daily.     LECITHIN PO Take 1 tablet by mouth every other day.     metFORMIN (GLUCOPHAGE-XR) 500 MG 24 hr tablet Take 2 tablets (1,000 mg total) by mouth 2 (two) times daily with meals for diabetes. (Patient taking differently: Take 1,000 mg by mouth daily with breakfast.) 360 tablet 3   Omega-3 Fatty Acids (FISH OIL PO) Take 1 Dose by mouth daily. Liquid     vitamin E 1000 UNIT capsule Take 1 capsule (1,000 Units total) by mouth daily.     zinc gluconate 50 MG tablet Takes 1 tablet every other day     allopurinol (ZYLOPRIM) 300 MG tablet Take 1 tablet (300 mg total) by mouth daily to prevent gout. 90 tablet 3   VITAMIN A PO Take 1 tablet by mouth every 30 (thirty) days.     clobetasol cream (TEMOVATE) 0.05 % Apply to affected areas 2 times a day for 14 days (Patient not taking: Reported on 12/02/2023) 30 g 1   No facility-administered medications prior to visit.    ROS: Review of Systems  Constitutional:  Negative for appetite change, fatigue and unexpected weight change.  HENT:  Negative for congestion, nosebleeds, sneezing, sore throat and trouble swallowing.   Eyes:  Negative for itching and visual disturbance.  Respiratory:  Negative for cough.   Cardiovascular:  Negative for chest pain, palpitations and leg swelling.  Gastrointestinal:   Negative for abdominal distention, blood in stool, diarrhea and nausea.  Genitourinary:  Negative for frequency and hematuria.  Musculoskeletal:  Positive for back pain. Negative for gait problem, joint swelling and neck pain.  Skin:  Negative for rash.  Neurological:  Negative for dizziness, tremors, speech difficulty and weakness.  Psychiatric/Behavioral:  Negative for agitation, dysphoric mood, sleep disturbance and suicidal ideas. The patient is not nervous/anxious.     Objective:  BP 122/72   Pulse 74   Temp 98.8 F (37.1 C) (Oral)   Ht 6' 0.05" (1.83 m)   Wt 249 lb (112.9 kg)   SpO2 92%   BMI 33.72 kg/m   BP Readings from Last 3 Encounters:  12/02/23 122/72  07/18/23 136/78  05/31/23 (!) 147/95    Wt Readings from Last 3 Encounters:  12/02/23 249 lb (112.9 kg)  07/18/23 244 lb 3.2 oz (110.8 kg)  05/30/23 237 lb (107.5 kg)    Physical Exam Constitutional:      General: He is not in acute distress.    Appearance: He is well-developed. He is obese. He is not toxic-appearing.     Comments: NAD  Eyes:     Conjunctiva/sclera: Conjunctivae normal.     Pupils: Pupils are equal, round, and reactive to light.  Neck:     Thyroid:  No thyromegaly.     Vascular: No JVD.  Cardiovascular:     Rate and Rhythm: Normal rate and regular rhythm.     Heart sounds: Normal heart sounds. No murmur heard.    No friction rub. No gallop.  Pulmonary:     Effort: Pulmonary effort is normal. No respiratory distress.     Breath sounds: Normal breath sounds. No wheezing or rales.  Chest:     Chest wall: No tenderness.  Abdominal:     General: Bowel sounds are normal. There is no distension.     Palpations: Abdomen is soft. There is no mass.     Tenderness: There is no abdominal tenderness. There is no guarding or rebound.  Genitourinary:    Penis: Normal.      Rectum: Normal. Guaiac result negative.  Musculoskeletal:        General: No tenderness. Normal range of motion.     Cervical  back: Normal range of motion.  Lymphadenopathy:     Cervical: No cervical adenopathy.  Skin:    General: Skin is warm and dry.     Findings: No rash.  Neurological:     Mental Status: He is alert and oriented to person, place, and time.     Cranial Nerves: No cranial nerve deficit.     Motor: No abnormal muscle tone.     Coordination: Coordination normal.     Gait: Gait normal.     Deep Tendon Reflexes: Reflexes are normal and symmetric.  Psychiatric:        Behavior: Behavior normal.        Thought Content: Thought content normal.        Judgment: Judgment normal.   Prostate 1+  Lab Results  Component Value Date   WBC 5.0 12/03/2023   HGB 14.3 12/03/2023   HCT 43.3 12/03/2023   PLT 248.0 12/03/2023   GLUCOSE 107 (H) 12/03/2023   CHOL 159 12/03/2023   TRIG 44.0 12/03/2023   HDL 46.00 12/03/2023   LDLCALC 104 (H) 12/03/2023   ALT 25 12/03/2023   AST 30 12/03/2023   NA 139 12/03/2023   K 3.9 12/03/2023   CL 102 12/03/2023   CREATININE 0.97 12/03/2023   BUN 19 12/03/2023   CO2 28 12/03/2023   TSH 1.79 12/03/2023   PSA 0.71 12/03/2023   HGBA1C 6.0 12/03/2023   MICROALBUR 1.0 12/03/2023    DG HIP UNILAT WITH PELVIS 1V LEFT Result Date: 05/30/2023 CLINICAL DATA:  Left hip surgery. EXAM: DG HIP (WITH OR WITHOUT PELVIS) 1V*L* COMPARISON:  Left hip radiographs 04/14/2023 FLUOROSCOPY: Radiation Exposure Index (as provided by the fluoroscopic device): 3.1 mGy Kerma FINDINGS: Three intraoperative spot fluoroscopic images are provided during performance of a left total hip arthroplasty. The prosthetic components appear normally aligned on these limited images. A pre-existing right hip arthroplasty is again noted. IMPRESSION: Intraoperative images during left total hip arthroplasty. Electronically Signed   By: Sebastian Ache M.D.   On: 05/30/2023 09:45   DG Pelvis Portable Result Date: 05/30/2023 CLINICAL DATA:  [id number] Status post total replacement of left hip [id number] EXAM: PORTABLE  PELVIS 1-2 VIEWS COMPARISON:  05/02/2022. FINDINGS: Pelvis is intact with normal and symmetric sacroiliac joints. No acute fracture or dislocation. No aggressive osseous lesion. Visualized sacral arcuate lines are unremarkable. Unremarkable symphysis pubis. 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 No periprosthetic fracture or lucency. There is near anatomic alignment. Redemonstration of right total hip arthroplasty The hardware is  intact. No periprosthetic fracture or lucency. No interval change in alignment, when compared to the available recent prior examination. No radiopaque foreign bodies. IMPRESSION: *Baseline examination status post left total hip arthroplasty. No radiographic evidence of hardware complication. Electronically Signed   By: Jules Schick M.D.   On: 05/30/2023 09:38   DG C-Arm 1-60 Min-No Report Result Date: 05/30/2023 Fluoroscopy was utilized by the requesting physician.  No radiographic interpretation.    Assessment & Plan:   Problem List Items Addressed This Visit     Essential hypertension   Off meds.  On diet, exercise, weight loss      Relevant Orders   Testosterone (Completed)   RESOLVED: Morbid obesity (HCC) - BMI 30+ with OSA   Matt is on diet      Hyperlipidemia, mixed   Obtain lab work -lipids Good diet, fish oil      Vitamin D deficiency   On Vit D      Atrial fibrillation with controlled ventricular response (HCC)   PAF Follow-up with Dr. Graciela Husbands if problems.  Cha2ds2-vasc of 2, elequis and DCCV were recommended by Dr. Graciela Husbands but patient ultimately declined Sleep study found OSA, did start on CPAP      Relevant Orders   Testosterone (Completed)   OSA on CPAP   Not using CPAP at present.  May need a new mask      Gout   No relapse.  On Allopurinol Obtain uric acid      Relevant Orders   Uric acid (Completed)   Hyperglycemia due to type 2 diabetes mellitus (HCC)   Continue on metformin Obtain  A1c, urine microalbumin Libre 3 sample given      Relevant Orders   Hemoglobin A1c (Completed)   Testosterone (Completed)   Microalbumin / creatinine urine ratio (Completed)   History of acute pancreatitis   Remote.  No relapse      Diverticulitis large intestine   Remote.  History of diverticulitis-no relapse.  High-fiber diet      Spinal stenosis   Status post physical therapy with [facility]      Well adult exam - Primary    We discussed age appropriate health related issues, including available/recomended screening tests and vaccinations. Labs were ordered to be later reviewed . All questions were answered. We discussed one or more of the following - seat belt use, use of sunscreen/sun exposure exercise, fall risk reduction, second hand smoke exposure, firearm use and storage, seat belt use, a need for adhering to healthy diet and exercise. Labs were ordered.  All questions were answered. Last colonoscopy is in 2021 with Dr. Matthias Hughs.  Next is due in 5 years-2026       Relevant Orders   TSH (Completed)   Urinalysis (Completed)   CBC with Differential/Platelet (Completed)   Lipid panel (Completed)   PSA (Completed)   Comprehensive metabolic panel (Completed)   Iron, TIBC and Ferritin Panel (Completed)   Hemoglobin A1c (Completed)   Testosterone (Completed)   Microalbumin / creatinine urine ratio (Completed)   Lipid panel   Uric acid (Completed)   Other Visit Diagnoses       Iron deficiency anemia due to chronic blood loss       Relevant Orders   CBC with Differential/Platelet (Completed)   Iron, TIBC and Ferritin Panel (Completed)         Meds ordered this encounter  Medications   allopurinol (ZYLOPRIM) 300 MG tablet    Sig: Take 1 tablet (300 mg  total) by mouth daily to prevent gout.    Dispense:  90 tablet    Refill:  3      Follow-up: Return in about 6 months (around 06/03/2024) for a follow-up visit.  Sonda Primes, MD

## 2023-12-02 NOTE — Assessment & Plan Note (Signed)
 On Vit D

## 2023-12-02 NOTE — Assessment & Plan Note (Addendum)
 Continue on metformin Obtain A1c, urine microalbumin Libre 3 sample given

## 2023-12-02 NOTE — Assessment & Plan Note (Addendum)
 Off meds.  On diet, exercise, weight loss

## 2023-12-03 ENCOUNTER — Other Ambulatory Visit (INDEPENDENT_AMBULATORY_CARE_PROVIDER_SITE_OTHER)

## 2023-12-03 DIAGNOSIS — E1165 Type 2 diabetes mellitus with hyperglycemia: Secondary | ICD-10-CM

## 2023-12-03 DIAGNOSIS — M10079 Idiopathic gout, unspecified ankle and foot: Secondary | ICD-10-CM | POA: Diagnosis not present

## 2023-12-03 DIAGNOSIS — Z0000 Encounter for general adult medical examination without abnormal findings: Secondary | ICD-10-CM

## 2023-12-03 DIAGNOSIS — I4891 Unspecified atrial fibrillation: Secondary | ICD-10-CM | POA: Diagnosis not present

## 2023-12-03 DIAGNOSIS — I10 Essential (primary) hypertension: Secondary | ICD-10-CM | POA: Diagnosis not present

## 2023-12-03 DIAGNOSIS — D500 Iron deficiency anemia secondary to blood loss (chronic): Secondary | ICD-10-CM | POA: Diagnosis not present

## 2023-12-03 LAB — URINALYSIS
Bilirubin Urine: NEGATIVE
Ketones, ur: NEGATIVE
Leukocytes,Ua: NEGATIVE
Nitrite: NEGATIVE
Specific Gravity, Urine: 1.01 (ref 1.000–1.030)
Total Protein, Urine: NEGATIVE
Urine Glucose: NEGATIVE
Urobilinogen, UA: 0.2 (ref 0.0–1.0)
pH: 6.5 (ref 5.0–8.0)

## 2023-12-03 LAB — COMPREHENSIVE METABOLIC PANEL
ALT: 25 U/L (ref 0–53)
AST: 30 U/L (ref 0–37)
Albumin: 4.5 g/dL (ref 3.5–5.2)
Alkaline Phosphatase: 76 U/L (ref 39–117)
BUN: 19 mg/dL (ref 6–23)
CO2: 28 meq/L (ref 19–32)
Calcium: 9.4 mg/dL (ref 8.4–10.5)
Chloride: 102 meq/L (ref 96–112)
Creatinine, Ser: 0.97 mg/dL (ref 0.40–1.50)
GFR: 78.82 mL/min (ref >60.00)
Glucose, Bld: 107 mg/dL — ABNORMAL HIGH (ref 70–99)
Potassium: 3.9 meq/L (ref 3.5–5.1)
Sodium: 139 meq/L (ref 135–145)
Total Bilirubin: 0.6 mg/dL (ref 0.2–1.2)
Total Protein: 7.2 g/dL (ref 6.0–8.3)

## 2023-12-03 LAB — CBC WITH DIFFERENTIAL/PLATELET
Basophils Absolute: 0.1 10*3/uL (ref 0.0–0.1)
Basophils Relative: 1.6 % (ref 0.0–3.0)
Eosinophils Absolute: 0.3 10*3/uL (ref 0.0–0.7)
Eosinophils Relative: 5.1 % — ABNORMAL HIGH (ref 0.0–5.0)
HCT: 43.3 % (ref 39.0–52.0)
Hemoglobin: 14.3 g/dL (ref 13.0–17.0)
Lymphocytes Relative: 23.7 % (ref 12.0–46.0)
Lymphs Abs: 1.2 10*3/uL (ref 0.7–4.0)
MCHC: 33 g/dL (ref 30.0–36.0)
MCV: 93.1 fl (ref 78.0–100.0)
Monocytes Absolute: 0.6 10*3/uL (ref 0.1–1.0)
Monocytes Relative: 11.7 % (ref 3.0–12.0)
Neutro Abs: 2.9 10*3/uL (ref 1.4–7.7)
Neutrophils Relative %: 57.9 % (ref 43.0–77.0)
Platelets: 248 10*3/uL (ref 150.0–400.0)
RBC: 4.65 Mil/uL (ref 4.22–5.81)
RDW: 15.7 % — ABNORMAL HIGH (ref 11.5–15.5)
WBC: 5 10*3/uL (ref 4.0–10.5)

## 2023-12-03 LAB — LIPID PANEL
Cholesterol: 159 mg/dL (ref 0–200)
HDL: 46 mg/dL (ref >39.00)
LDL Cholesterol: 104 mg/dL — ABNORMAL HIGH (ref 0–99)
NonHDL: 113.15
Total CHOL/HDL Ratio: 3
Triglycerides: 44 mg/dL (ref 0.0–149.0)
VLDL: 8.8 mg/dL (ref 0.0–40.0)

## 2023-12-03 LAB — PSA: PSA: 0.71 ng/mL (ref 0.10–4.00)

## 2023-12-03 LAB — MICROALBUMIN / CREATININE URINE RATIO
Creatinine,U: 29.6 mg/dL
Microalb Creat Ratio: 34.4 mg/g — ABNORMAL HIGH (ref 0.0–30.0)
Microalb, Ur: 1 mg/dL (ref 0.0–1.9)

## 2023-12-03 LAB — TESTOSTERONE: Testosterone: 432.9 ng/dL (ref 300.00–890.00)

## 2023-12-03 LAB — HEMOGLOBIN A1C: Hgb A1c MFr Bld: 6 % (ref 4.6–6.5)

## 2023-12-03 LAB — TSH: TSH: 1.79 u[IU]/mL (ref 0.35–5.50)

## 2023-12-03 LAB — URIC ACID: Uric Acid, Serum: 5.7 mg/dL (ref 4.0–7.8)

## 2023-12-04 ENCOUNTER — Encounter: Payer: Self-pay | Admitting: Internal Medicine

## 2023-12-04 LAB — IRON,TIBC AND FERRITIN PANEL
%SAT: 21 % (ref 20–48)
Ferritin: 104 ng/mL (ref 24–380)
Iron: 74 ug/dL (ref 50–180)
TIBC: 347 ug/dL (ref 250–425)

## 2023-12-07 DIAGNOSIS — K5732 Diverticulitis of large intestine without perforation or abscess without bleeding: Secondary | ICD-10-CM | POA: Insufficient documentation

## 2023-12-07 DIAGNOSIS — Z0000 Encounter for general adult medical examination without abnormal findings: Secondary | ICD-10-CM | POA: Insufficient documentation

## 2023-12-07 DIAGNOSIS — M4800 Spinal stenosis, site unspecified: Secondary | ICD-10-CM | POA: Insufficient documentation

## 2023-12-07 NOTE — Assessment & Plan Note (Signed)
 Harold Davis is on diet

## 2023-12-07 NOTE — Assessment & Plan Note (Signed)
 Remote.  History of diverticulitis-no relapse.  High-fiber diet

## 2023-12-07 NOTE — Assessment & Plan Note (Signed)
 Status post physical therapy with [facility]

## 2023-12-07 NOTE — Assessment & Plan Note (Signed)
 Obtain lab work -lipids Good diet, fish oil

## 2023-12-07 NOTE — Assessment & Plan Note (Signed)
 Not using CPAP at present.  May need a new mask

## 2023-12-07 NOTE — Assessment & Plan Note (Signed)
  We discussed age appropriate health related issues, including available/recomended screening tests and vaccinations. Labs were ordered to be later reviewed . All questions were answered. We discussed one or more of the following - seat belt use, use of sunscreen/sun exposure exercise, fall risk reduction, second hand smoke exposure, firearm use and storage, seat belt use, a need for adhering to healthy diet and exercise. Labs were ordered.  All questions were answered. Last colonoscopy is in 2021 with Dr. Matthias Hughs.  Next is due in 5 years-2026

## 2023-12-07 NOTE — Assessment & Plan Note (Signed)
Remote No relapse 

## 2023-12-24 ENCOUNTER — Ambulatory Visit: Payer: PPO | Admitting: Internal Medicine

## 2024-01-08 ENCOUNTER — Encounter: Payer: Self-pay | Admitting: Orthopaedic Surgery

## 2024-01-08 ENCOUNTER — Ambulatory Visit: Payer: PPO | Admitting: Orthopaedic Surgery

## 2024-01-08 ENCOUNTER — Other Ambulatory Visit (INDEPENDENT_AMBULATORY_CARE_PROVIDER_SITE_OTHER): Payer: Self-pay

## 2024-01-08 DIAGNOSIS — Z96642 Presence of left artificial hip joint: Secondary | ICD-10-CM | POA: Diagnosis not present

## 2024-01-08 NOTE — Progress Notes (Signed)
 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  We replaced his right hip back in 2020.  He is retired now.  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  This is definitely helped his posture and he looks much better overall when I see him walk.  He has really no issues as it relates to his hips.  Both hips move smoothly and fluidly.  His leg lengths are equal.  An AP pelvis shows bilateral total hip arthroplasties with no complicating features.  At this point follow-up can be as needed for his hips.  He always knows to call me or text me if he has any issues from a musculoskeletal standpoint and I will see him at any time.  All question concerns were addressed and answered.

## 2024-01-22 ENCOUNTER — Ambulatory Visit: Payer: PPO | Admitting: Nurse Practitioner

## 2024-02-06 ENCOUNTER — Ambulatory Visit (INDEPENDENT_AMBULATORY_CARE_PROVIDER_SITE_OTHER)

## 2024-02-06 VITALS — BP 130/80 | HR 75 | Ht 71.75 in | Wt 256.0 lb

## 2024-02-06 DIAGNOSIS — E1165 Type 2 diabetes mellitus with hyperglycemia: Secondary | ICD-10-CM | POA: Diagnosis not present

## 2024-02-06 DIAGNOSIS — Z0000 Encounter for general adult medical examination without abnormal findings: Secondary | ICD-10-CM

## 2024-02-06 NOTE — Progress Notes (Cosign Needed Addendum)
 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  As a reminder, Annual Wellness Visits don't include a physical exam, and some assessments may be limited, especially if this visit is performed virtually. We may recommend an in-person visit if needed.  Visit Complete: In person  Persons Participating in Visit: Patient.  AWV Questionnaire: Yes: Patient Medicare AWV questionnaire was completed by the patient on 02/03/2024; I have confirmed that all information answered by patient is correct and no changes since this date.  Cardiac Risk Factors include: advanced age (>14men, >26 women);hypertension;male gender;Other (see comment);diabetes mellitus;dyslipidemia, Risk factor comments: A-Fib, OSA     Objective:     Today's Vitals   02/06/24 1136  BP: 130/80  Pulse: 75  SpO2: 96%  Weight: 256 lb (116.1 kg)  Height: 5' 11.75" (1.822 m)   Body mass index is 34.96 kg/m.     02/06/2024   11:42 AM 05/30/2023   12:37 PM 05/21/2023    8:20 AM 11/19/2022    3:39 PM 11/19/2021    3:59 PM 11/16/2020    4:18 PM 03/09/2020    2:38 PM  Advanced Directives  Does Patient Have a Medical Advance Directive? Yes Yes Yes Yes Yes Yes Yes  Type of Estate agent of [place];Living will Healthcare Power of [place];Living will Healthcare Power of [place];Living will Healthcare Power of [place];Living will Healthcare Power of [place];Living will Healthcare Power of [place];Living will Healthcare Power of [place];Living will  Does patient want to make changes to medical advance directive? No - Patient declined No - Patient declined  No - Patient declined No - Patient declined No - Patient declined No - Patient declined  Copy of Healthcare Power of Attorney in Chart? Yes - validated most recent copy scanned in chart (See row information) No - copy requested No - copy requested No - copy requested No - copy requested Yes - validated  most recent copy scanned in chart (See row information) No - copy requested  Would patient like information on creating a medical advance directive?       No - Patient declined    Current Medications (verified) Outpatient Encounter Medications as of 02/06/2024  Medication Sig   allopurinol  (ZYLOPRIM ) 300 MG tablet Take 1 tablet (300 mg total) by mouth daily to prevent gout.   Ascorbic Acid  (VITAMIN C ) 1000 MG tablet Take 1,000 mg by mouth daily.   aspirin  EC 81 MG tablet Take 1 tablet (81 mg total) by mouth 2 (two) times daily. Takes 1 tablet Daily   Cholecalciferol  (VITAMIN D3) 10000 units TABS Take 10,000 Units by mouth daily.   clobetasol  cream (TEMOVATE ) 0.05 % Apply to affected areas 2 times a day for 14 days   LECITHIN PO Take 1 tablet by mouth every other day.   metFORMIN  (GLUCOPHAGE -XR) 500 MG 24 hr tablet Take 2 tablets (1,000 mg total) by mouth 2 (two) times daily with meals for diabetes. (Patient taking differently: Take 500 mg by mouth daily with breakfast.)   Omega-3 Fatty Acids (FISH OIL PO) Take 1 Dose by mouth daily. Liquid   vitamin E  1000 UNIT capsule Take 1 capsule (1,000 Units total) by mouth daily.   zinc  gluconate 50 MG tablet Takes 1 tablet every other day   No facility-administered encounter medications on file as of 02/06/2024.    Allergies (verified) Patient has no known allergies.   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   Past Surgical History:  Procedure Laterality Date   EUS N/A 02/04/2018   Procedure: UPPER ENDOSCOPIC ULTRASOUND (EUS) RADIAL;  Surgeon: Evangeline Hilts, MD;  Location: [facility];  Service: Endoscopy;  Laterality: N/A;   EYE SURGERY Bilateral    Left 08/2007, R 02/02/2013   TOTAL HIP ARTHROPLASTY Right 02/19/2019   Procedure: RIGHT TOTAL HIP ARTHROPLASTY ANTERIOR APPROACH;  Surgeon: Arnie Lao, MD;  Location: [facility];  Service: Orthopedics;  Laterality: Right;   TOTAL HIP ARTHROPLASTY Left 05/30/2023   Procedure: LEFT TOTAL HIP ARTHROPLASTY ANTERIOR APPROACH;  Surgeon: Arnie Lao, MD;  Location: [facility];  Service: Orthopedics;  Laterality: Left;   Family History  Problem Relation Age of Onset   Stroke Mother    Cancer Mother 50       colon   Cancer Father 23       lung, colon   Cancer Sister        breast cancer   Heart disease Brother    Cancer Brother        prostate   Social History   Socioeconomic History   Marital status: Legally Separated    Spouse name: Not on file   Number of children: 3   Years of education: Not on file   Highest education level: Professional school degree (e.g., MD, DDS, DVM, JD)  Occupational History   Occupation: RETIRED  Tobacco Use   Smoking status: Never   Smokeless tobacco: Never  Vaping Use   Vaping status: Never Used  Substance and Sexual Activity   Alcohol use: Yes    Comment: infrequent   Drug use: Never   Sexual activity: Yes  Other Topics Concern   Not on file  Social History Narrative   Lives alone/2025   Social Drivers of Health   Financial Resource Strain: Low Risk  (02/06/2024)   Overall Financial Resource Strain (CARDIA)    Difficulty of Paying Living Expenses: Not hard at all  Food Insecurity: No Food Insecurity (02/06/2024)   Hunger Vital Sign    Worried About Running Out of Food in the Last Year: Never true    Ran Out of Food in the Last Year: Never true   Transportation Needs: No Transportation Needs (02/06/2024)   PRAPARE - Administrator, Civil Service (Medical): No    Lack of Transportation (Non-Medical): No  Physical Activity: Insufficiently Active (02/06/2024)   Exercise Vital Sign    Days of Exercise per Week: 5 days    Minutes of Exercise per Session: 20 min  Stress: No Stress Concern Present (02/06/2024)   [employer] of Occupational Health - Occupational Stress Questionnaire    Feeling of Stress : Not at all  Social Connections: Moderately Isolated (02/06/2024)   Social Connection and Isolation Panel [NHANES]    Frequency  of Communication with Friends and Family: More than three times a week    Frequency of Social Gatherings with Friends and Family: Three times a week    Attends Religious Services: Never    Active Member of Clubs or Organizations: Yes    Attends Banker Meetings: 1 to 4 times per year    Marital Status: Separated    Tobacco Counseling Counseling given: Not Answered    Clinical Intake:  Pre-visit preparation completed: Yes  Pain : No/denies pain     BMI - recorded: 34.96 Nutritional Status: BMI > 30  Obese Nutritional Risks: None Diabetes: Yes CBG done?: No Did pt. bring in CBG monitor from home?: No  Lab Results  Component Value Date   HGBA1C 6.0 12/03/2023   HGBA1C 5.7 (H) 04/08/2023   HGBA1C 5.9 (H) 11/28/2022     How often do you need to have someone help you when you read instructions, pamphlets, or other written materials from your doctor or pharmacy?: 1 - Never  Interpreter Needed?: No  Information entered by :: Davionne Mastrangelo, RMA   Activities of Daily Living     02/03/2024    8:34 AM 05/30/2023   12:39 PM  In your present state of health, do you have any difficulty performing the following activities:  Hearing? 0   Vision? 0   Difficulty concentrating or making decisions? 0   Walking or climbing stairs? 0   Dressing or bathing? 0   Doing errands,  shopping? 0 0  Preparing Food and eating ? N   Using the Toilet? N   In the past six months, have you accidently leaked urine? N   Do you have problems with loss of bowel control? N   Managing your Medications? N   Managing your Finances? N   Housekeeping or managing your Housekeeping? N     Patient Care Team: Plotnikov, Oakley Bellman, MD as PCP - General (Internal Medicine) Hutto, Jeannett Million, OD (Optometry)  Indicate any recent Medical Services you may have received from other than [facility] providers in the past year (date may be approximate).     Assessment:    This is a routine wellness examination for [place].  Hearing/Vision screen Hearing Screening - Comments:: Denies hearing difficulties   Vision Screening - Comments:: Wears eyeglasses/ Dr. 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 0 0 0 1 0  Number falls in past yr: 0  0 0 0  Injury with Fall? 0  0 0 0  Risk for  fall due to :  No Fall Risks No Fall Risks No Fall Risks No Fall Risks  Follow up  Falls prevention discussed;Education provided;Falls evaluation completed Falls evaluation completed;Falls prevention discussed Falls evaluation completed;Falls prevention discussed Falls evaluation completed;Falls prevention discussed    MEDICARE RISK AT HOME:  Medicare Risk at Home Any stairs in or around the home?: (Patient-Rptd) Yes If so, are there any without handrails?: (Patient-Rptd)  Yes Home free of loose throw rugs in walkways, pet beds, electrical cords, etc?: (Patient-Rptd) Yes Adequate lighting in your home to reduce risk of falls?: (Patient-Rptd) Yes Life alert?: (Patient-Rptd) Yes Use of a cane, walker or w/c?: (Patient-Rptd) No Grab bars in the bathroom?: (Patient-Rptd) No Shower chair or bench in shower?: (Patient-Rptd) Yes Elevated toilet seat or a handicapped toilet?: (Patient-Rptd) No  TIMED UP AND GO:  Was the test performed?  Yes  Length of time to ambulate 10 feet: 10 sec Gait steady and fast without use of assistive device  Cognitive Function: Declined/Normal: No cognitive concerns noted by patient or family. Patient alert, oriented, able to answer questions appropriately and recall recent events. No signs of memory loss or confusion.        Immunizations Immunization History  Administered Date(s) Administered   Influenza-Unspecified 06/30/2020, 07/05/2021   PFIZER(Purple Top)SARS-COV-2 Vaccination 09/19/2019, 10/07/2019   Tdap 03/12/2021    Screening Tests Health Maintenance  Topic Date Due   OPHTHALMOLOGY EXAM  Never done   Pneumonia Vaccine 32+ Years old (1 of 2 - PCV) Never done   Zoster Vaccines- Shingrix (1 of 2) Never done   COVID-19 Vaccine (3 - Pfizer risk series) 11/04/2019   INFLUENZA VACCINE  04/30/2024   HEMOGLOBIN A1C  06/04/2024   Diabetic kidney evaluation - eGFR measurement  12/02/2024   Diabetic kidney evaluation - Urine ACR  12/02/2024   Colonoscopy  12/09/2024   FOOT EXAM  02/05/2025   Medicare Annual Wellness (AWV)  02/05/2025   DTaP/Tdap/Td (2 - Td or Tdap) 03/13/2031   Hepatitis C Screening  Completed   HPV VACCINES  Aged Out   Meningococcal B Vaccine  Aged Out    Health Maintenance  Health Maintenance Due  Topic Date Due   OPHTHALMOLOGY EXAM  Never done   Pneumonia Vaccine 15+ Years old (1 of 2 - PCV) Never done   Zoster Vaccines- Shingrix (1 of 2) Never done   COVID-19 Vaccine (3 - Pfizer risk series)  11/04/2019   Health Maintenance Items Addressed: Diabetic Foot Exam scheduled  Additional Screening:  Vision Screening: Recommended annual ophthalmology exams for early detection of glaucoma and other disorders of the eye.  Dental Screening: Recommended annual dental exams for proper oral hygiene  Community Resource Referral / Chronic Care Management: CRR required this visit?  No   CCM required this visit?  No   Plan:    I have personally reviewed and noted the following in the patient's chart:   Medical and social history Use of alcohol, tobacco or illicit drugs  Current medications and supplements including opioid prescriptions. Patient is not currently taking opioid prescriptions. Functional ability and status Nutritional status Physical activity Advanced directives List of other physicians Hospitalizations, surgeries, and ER visits in previous 12 months Vitals Screenings to include cognitive, depression, and falls Referrals and appointments  In addition, I have reviewed and discussed with patient certain preventive protocols, quality metrics, and best practice recommendations. A written personalized care plan for preventive services as well as general preventive health recommendations were provided to patient.  Woodrow Dulski L Serafin [place], CMA   02/06/2024   After Visit Summary: (MyChart) Due to this being a telephonic visit, the after visit summary with patients personalized plan was offered to patient via MyChart   Notes: Please refer to Routing Comments.   Medical screening examination/treatment/procedure(s) were performed by non-physician practitioner and as supervising physician I was immediately available for consultation/collaboration.  I agree with above. Adelaide Holy, MD

## 2024-02-06 NOTE — Patient Instructions (Signed)
 Harold Davis , Thank you for taking time out of your busy schedule to complete your Annual Wellness Visit with me. I enjoyed our conversation and look forward to speaking with you again next year. I, as well as your care team,  appreciate your ongoing commitment to your health goals. Please review the following plan we discussed and let me know if I can assist you in the future. Your Game plan/ To Do List     Follow up Visits: Next Medicare AWV with our clinical staff: 02/08/2025.   Have you seen your provider in the last 6 months (3 months if uncontrolled diabetes)? Yes Next Office Visit with your provider: 04/19/2024.  Clinician Recommendations:  Aim for 30 minutes of exercise or brisk walking, 6-8 glasses of water , and 5 servings of fruits and vegetables each day. You are due for a foot exam.        This is a list of the screening recommended for you and due dates:  Health Maintenance  Topic Date Due   Complete foot exam   Never done   Eye exam for diabetics  Never done   Pneumonia Vaccine (1 of 2 - PCV) Never done   Zoster (Shingles) Vaccine (1 of 2) Never done   COVID-19 Vaccine (3 - Pfizer risk series) 11/04/2019   Flu Shot  04/30/2024   Hemoglobin A1C  06/04/2024   Yearly kidney function blood test for diabetes  12/02/2024   Yearly kidney health urinalysis for diabetes  12/02/2024   Colon Cancer Screening  12/09/2024   Medicare Annual Wellness Visit  02/05/2025   DTaP/Tdap/Td vaccine (2 - Td or Tdap) 03/13/2031   Hepatitis C Screening  Completed   HPV Vaccine  Aged Out   Meningitis B Vaccine  Aged Out    Advanced directives: (In Chart) A copy of your advanced directives are scanned into your chart should your provider ever need it. Advance Care Planning is important because it:  [x]  Makes sure you receive the medical care that is consistent with your values, goals, and preferences  [x]  It provides guidance to your family and loved ones and reduces their decisional burden  about whether or not they are making the right decisions based on your wishes.  Follow the link provided in your after visit summary or read over the paperwork we have mailed to you to help you started getting your Advance Directives in place. If you need assistance in completing these, please reach out to us  so that we can help you!  See attachments for Preventive Care and Fall Prevention Tips.

## 2024-02-12 DIAGNOSIS — D045 Carcinoma in situ of skin of trunk: Secondary | ICD-10-CM | POA: Diagnosis not present

## 2024-02-12 DIAGNOSIS — L820 Inflamed seborrheic keratosis: Secondary | ICD-10-CM | POA: Diagnosis not present

## 2024-03-02 ENCOUNTER — Other Ambulatory Visit (HOSPITAL_COMMUNITY): Payer: Self-pay

## 2024-03-02 DIAGNOSIS — Z85828 Personal history of other malignant neoplasm of skin: Secondary | ICD-10-CM | POA: Diagnosis not present

## 2024-03-02 DIAGNOSIS — L570 Actinic keratosis: Secondary | ICD-10-CM | POA: Diagnosis not present

## 2024-03-02 DIAGNOSIS — D1801 Hemangioma of skin and subcutaneous tissue: Secondary | ICD-10-CM | POA: Diagnosis not present

## 2024-03-02 DIAGNOSIS — L7211 Pilar cyst: Secondary | ICD-10-CM | POA: Diagnosis not present

## 2024-03-02 DIAGNOSIS — L814 Other melanin hyperpigmentation: Secondary | ICD-10-CM | POA: Diagnosis not present

## 2024-03-02 DIAGNOSIS — D225 Melanocytic nevi of trunk: Secondary | ICD-10-CM | POA: Diagnosis not present

## 2024-03-02 DIAGNOSIS — L821 Other seborrheic keratosis: Secondary | ICD-10-CM | POA: Diagnosis not present

## 2024-03-02 MED ORDER — FLUOROURACIL 5 % EX CREA
TOPICAL_CREAM | CUTANEOUS | 0 refills | Status: AC
  Filled 2024-03-02: qty 40, 30d supply, fill #0

## 2024-03-03 ENCOUNTER — Other Ambulatory Visit (HOSPITAL_COMMUNITY): Payer: Self-pay

## 2024-04-14 ENCOUNTER — Encounter: Payer: PPO | Admitting: Internal Medicine

## 2024-04-19 ENCOUNTER — Ambulatory Visit: Admitting: Internal Medicine

## 2024-04-28 ENCOUNTER — Encounter: Payer: PPO | Admitting: Internal Medicine

## 2024-06-01 ENCOUNTER — Other Ambulatory Visit: Payer: Self-pay | Admitting: Medical Genetics

## 2024-06-08 ENCOUNTER — Ambulatory Visit: Admitting: Internal Medicine

## 2024-06-08 ENCOUNTER — Encounter: Payer: Self-pay | Admitting: Internal Medicine

## 2024-06-08 VITALS — BP 146/94 | HR 55 | Temp 97.8°F | Ht 73.0 in | Wt 257.4 lb

## 2024-06-08 DIAGNOSIS — I4891 Unspecified atrial fibrillation: Secondary | ICD-10-CM

## 2024-06-08 DIAGNOSIS — E1165 Type 2 diabetes mellitus with hyperglycemia: Secondary | ICD-10-CM | POA: Diagnosis not present

## 2024-06-08 DIAGNOSIS — E559 Vitamin D deficiency, unspecified: Secondary | ICD-10-CM | POA: Diagnosis not present

## 2024-06-08 DIAGNOSIS — Z0000 Encounter for general adult medical examination without abnormal findings: Secondary | ICD-10-CM

## 2024-06-08 DIAGNOSIS — M1000 Idiopathic gout, unspecified site: Secondary | ICD-10-CM

## 2024-06-08 DIAGNOSIS — I10 Essential (primary) hypertension: Secondary | ICD-10-CM | POA: Diagnosis not present

## 2024-06-08 DIAGNOSIS — M25562 Pain in left knee: Secondary | ICD-10-CM | POA: Insufficient documentation

## 2024-06-08 DIAGNOSIS — Z7984 Long term (current) use of oral hypoglycemic drugs: Secondary | ICD-10-CM

## 2024-06-08 NOTE — Assessment & Plan Note (Signed)
 No relapse.  On Allopurinol Obtain uric acid

## 2024-06-08 NOTE — Assessment & Plan Note (Signed)
 PAF Follow-up with Dr. Graciela Husbands if problems.  Cha2ds2-vasc of 2, elequis and DCCV were recommended by Dr. Graciela Husbands but patient ultimately declined Sleep study found OSA, did start on CPAP

## 2024-06-08 NOTE — Assessment & Plan Note (Signed)
 On Metformin .  On diet, exercise, weight loss

## 2024-06-08 NOTE — Assessment & Plan Note (Signed)
 Medial meniscus injury - summer 2025

## 2024-06-08 NOTE — Progress Notes (Signed)
 Subjective:  Patient ID: Harold KATHEE Lunger, MD, male    DOB: Aug 07, 1951  Age: 73 y.o. MRN: [id number]  CC: Follow-up (Patient states he is aging. States he tore his meniscus this summer. States he will inform you of things. States he tried the freestyle libre to check his blood sugar and was impressed by it. )   HPI Harold KATHEE Lunger, MD presents for hyperglycemia, L knee injury, DM2 F/u on AKs - using 5 FU cream  Outpatient Medications Prior to Visit  Medication Sig Dispense Refill   allopurinol  (ZYLOPRIM ) 300 MG tablet Take 1 tablet (300 mg total) by mouth daily to prevent gout. 90 tablet 3   Ascorbic Acid  (VITAMIN C ) 1000 MG tablet Take 1,000 mg by mouth daily.     aspirin  EC 81 MG tablet Take 1 tablet (81 mg total) by mouth 2 (two) times daily. Takes 1 tablet Daily     Cholecalciferol  (VITAMIN D3) 10000 units TABS Take 10,000 Units by mouth daily.     clobetasol  cream (TEMOVATE ) 0.05 % Apply to affected areas 2 times a day for 14 days 30 g 1   fluorouracil  (EFUDEX ) 5 % cream Apply thin layer to affected areas twice a day x 2 weeks, then stop. 40 g 0   LECITHIN PO Take 1 tablet by mouth every other day.     metFORMIN  (GLUCOPHAGE -XR) 500 MG 24 hr tablet Take 2 tablets (1,000 mg total) by mouth 2 (two) times daily with meals for diabetes. (Patient taking differently: Take 500 mg by mouth daily with breakfast.) 360 tablet 3   Omega-3 Fatty Acids (FISH OIL PO) Take 1 Dose by mouth daily. Liquid     vitamin E  1000 UNIT capsule Take 1 capsule (1,000 Units total) by mouth daily.     zinc  gluconate 50 MG tablet Takes 1 tablet every other day     No facility-administered medications prior to visit.    ROS: Review of Systems  Constitutional:  Negative for appetite change, fatigue and unexpected weight change.  HENT:  Negative for congestion, nosebleeds, sneezing, sore throat and trouble swallowing.   Eyes:  Negative for itching and visual disturbance.  Respiratory:  Negative for cough.    Cardiovascular:  Negative for chest pain, palpitations and leg swelling.  Gastrointestinal:  Negative for abdominal distention, blood in stool, diarrhea and nausea.  Genitourinary:  Negative for frequency and hematuria.  Musculoskeletal:  Positive for arthralgias. Negative for back pain, gait problem, joint swelling and neck pain.  Skin:  Negative for rash.  Neurological:  Negative for dizziness, tremors, speech difficulty and weakness.  Psychiatric/Behavioral:  Negative for agitation, dysphoric mood, sleep disturbance and suicidal ideas. The patient is not nervous/anxious.     Objective:  BP (!) 146/94   Pulse (!) 55   Temp 97.8 F (36.6 C) (Oral)   Ht 6' 1 (1.854 m)   Wt 257 lb 6.4 oz (116.8 kg)   SpO2 96%   BMI 33.96 kg/m   BP Readings from Last 3 Encounters:  06/08/24 (!) 146/94  02/06/24 130/80  12/02/23 122/72    Wt Readings from Last 3 Encounters:  06/08/24 257 lb 6.4 oz (116.8 kg)  02/06/24 256 lb (116.1 kg)  12/02/23 249 lb (112.9 kg)    Physical Exam Constitutional:      General: He is not in acute distress.    Appearance: He is well-developed. He is obese.     Comments: NAD  Eyes:     Conjunctiva/sclera: Conjunctivae normal.  Pupils: Pupils are equal, round, and reactive to light.  Neck:     Thyroid : No thyromegaly.     Vascular: No JVD.  Cardiovascular:     Rate and Rhythm: Bradycardia present. Rhythm irregular.     Heart sounds: Normal heart sounds. No murmur heard.    No friction rub. No gallop.  Pulmonary:     Effort: Pulmonary effort is normal. No respiratory distress.     Breath sounds: Normal breath sounds. No wheezing or rales.  Chest:     Chest wall: No tenderness.  Abdominal:     General: Bowel sounds are normal. There is no distension.     Palpations: Abdomen is soft. There is no mass.     Tenderness: There is no abdominal tenderness. There is no guarding or rebound.  Musculoskeletal:        General: No tenderness. Normal range of  motion.     Cervical back: Normal range of motion.  Lymphadenopathy:     Cervical: No cervical adenopathy.  Skin:    General: Skin is warm and dry.     Findings: No rash.  Neurological:     Mental Status: He is alert and oriented to person, place, and time.     Cranial Nerves: No cranial nerve deficit.     Motor: No abnormal muscle tone.     Coordination: Coordination normal.     Gait: Gait normal.     Deep Tendon Reflexes: Reflexes are normal and symmetric.  Psychiatric:        Behavior: Behavior normal.        Thought Content: Thought content normal.        Judgment: Judgment normal.     Lab Results  Component Value Date   WBC 5.0 12/03/2023   HGB 14.3 12/03/2023   HCT 43.3 12/03/2023   PLT 248.0 12/03/2023   GLUCOSE 107 (H) 12/03/2023   CHOL 159 12/03/2023   TRIG 44.0 12/03/2023   HDL 46.00 12/03/2023   LDLCALC 104 (H) 12/03/2023   ALT 25 12/03/2023   AST 30 12/03/2023   NA 139 12/03/2023   K 3.9 12/03/2023   CL 102 12/03/2023   CREATININE 0.97 12/03/2023   BUN 19 12/03/2023   CO2 28 12/03/2023   TSH 1.79 12/03/2023   PSA 0.71 12/03/2023   HGBA1C 6.0 12/03/2023   MICROALBUR 1.0 12/03/2023    DG HIP UNILAT WITH PELVIS 1V LEFT Result Date: 05/30/2023 CLINICAL DATA:  Left hip surgery. EXAM: DG HIP (WITH OR WITHOUT PELVIS) 1V*L* COMPARISON:  Left hip radiographs 04/14/2023 FLUOROSCOPY: Radiation Exposure Index (as provided by the fluoroscopic device): 3.1 mGy Kerma FINDINGS: Three intraoperative spot fluoroscopic images are provided during performance of a left total hip arthroplasty. The prosthetic components appear normally aligned on these limited images. A pre-existing right hip arthroplasty is again noted. IMPRESSION: Intraoperative images during left total hip arthroplasty. Electronically Signed   By: Dasie Hamburg M.D.   On: 05/30/2023 09:45   DG Pelvis Portable Result Date: 05/30/2023 CLINICAL DATA:  [id number] Status post total replacement of left hip [id number]  EXAM: PORTABLE PELVIS 1-2 VIEWS COMPARISON:  05/02/2022. FINDINGS: Pelvis is intact with normal and symmetric sacroiliac joints. No acute fracture or dislocation. No aggressive osseous lesion. Visualized sacral arcuate lines are unremarkable. Unremarkable symphysis pubis. 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 No periprosthetic fracture or lucency. There is near anatomic alignment. Redemonstration of right total hip arthroplasty The hardware is intact. No periprosthetic  fracture or lucency. No interval change in alignment, when compared to the available recent prior examination. No radiopaque foreign bodies. IMPRESSION: *Baseline examination status post left total hip arthroplasty. No radiographic evidence of hardware complication. Electronically Signed   By: Ree Molt M.D.   On: 05/30/2023 09:38   DG C-Arm 1-60 Min-No Report Result Date: 05/30/2023 Fluoroscopy was utilized by the requesting physician.  No radiographic interpretation.    Assessment & Plan:   Problem List Items Addressed This Visit     Atrial fibrillation with controlled ventricular response (HCC)   PAF Follow-up with Dr. Fernande if problems.  Cha2ds2-vasc of 2, elequis and DCCV were recommended by Dr. Fernande but patient ultimately declined Sleep study found OSA, did start on CPAP      Relevant Orders   Hemoglobin A1c   Microalbumin / creatinine urine ratio   Comprehensive metabolic panel with GFR   CBC with Differential/Platelet   Essential hypertension   On Metformin .  On diet, exercise, weight loss      Gout   No relapse.  On Allopurinol  Obtain uric acid      Hyperglycemia due to type 2 diabetes mellitus (HCC)   Continue on metformin  Obtain A1c, urine microalbumin Libre 3 sample showed good CBGs      Relevant Orders   Hemoglobin A1c   Microalbumin / creatinine urine ratio   Comprehensive metabolic panel with GFR   CBC with Differential/Platelet   Knee pain,  left - Primary   Medial meniscus injury - summer 2025      Vitamin D  deficiency   On Vit D      Well adult exam      No orders of the defined types were placed in this encounter.     Follow-up: Return in about 5 months (around 11/08/2024) for a follow-up visit.  Marolyn Noel, MD

## 2024-06-08 NOTE — Assessment & Plan Note (Signed)
 On Vit D

## 2024-06-08 NOTE — Assessment & Plan Note (Signed)
 Continue on metformin  Obtain A1c, urine microalbumin Libre 3 sample showed good CBGs

## 2024-06-21 ENCOUNTER — Encounter: Payer: Self-pay | Admitting: Internal Medicine

## 2024-08-02 ENCOUNTER — Encounter: Payer: Self-pay | Admitting: Radiology

## 2024-09-01 ENCOUNTER — Other Ambulatory Visit (HOSPITAL_COMMUNITY): Payer: Self-pay

## 2024-09-01 MED ORDER — AMOXICILLIN-POT CLAVULANATE 875-125 MG PO TABS
1.0000 | ORAL_TABLET | Freq: Two times a day (BID) | ORAL | 1 refills | Status: AC
  Filled 2024-09-01: qty 20, 10d supply, fill #0
  Filled 2024-10-03: qty 20, 10d supply, fill #1

## 2024-09-06 ENCOUNTER — Other Ambulatory Visit (HOSPITAL_COMMUNITY): Payer: Self-pay

## 2024-09-06 ENCOUNTER — Encounter (HOSPITAL_COMMUNITY): Payer: Self-pay

## 2024-09-06 MED ORDER — METRONIDAZOLE 500 MG PO TABS
500.0000 mg | ORAL_TABLET | Freq: Three times a day (TID) | ORAL | 0 refills | Status: AC
  Filled 2024-09-06: qty 42, 14d supply, fill #0

## 2024-09-10 ENCOUNTER — Other Ambulatory Visit: Payer: Self-pay | Admitting: Internal Medicine

## 2024-09-10 DIAGNOSIS — E1122 Type 2 diabetes mellitus with diabetic chronic kidney disease: Secondary | ICD-10-CM

## 2024-09-12 MED ORDER — METFORMIN HCL ER 500 MG PO TB24
1000.0000 mg | ORAL_TABLET | Freq: Two times a day (BID) | ORAL | 3 refills | Status: AC
  Filled 2024-09-12: qty 360, 90d supply, fill #0

## 2024-09-13 ENCOUNTER — Other Ambulatory Visit (HOSPITAL_COMMUNITY): Payer: Self-pay

## 2024-10-21 ENCOUNTER — Encounter: Payer: Self-pay | Admitting: Internal Medicine

## 2024-10-23 ENCOUNTER — Other Ambulatory Visit: Payer: Self-pay | Admitting: Internal Medicine

## 2024-10-23 MED ORDER — HYDROCHLOROTHIAZIDE 25 MG PO TABS
25.0000 mg | ORAL_TABLET | Freq: Every day | ORAL | 3 refills | Status: AC
  Filled 2024-10-23: qty 90, 90d supply, fill #0

## 2024-10-24 ENCOUNTER — Other Ambulatory Visit: Payer: Self-pay

## 2024-10-24 ENCOUNTER — Other Ambulatory Visit (HOSPITAL_COMMUNITY): Payer: Self-pay

## 2025-02-08 ENCOUNTER — Ambulatory Visit
